# Patient Record
Sex: Female | Born: 1970 | Race: White | Hispanic: No | Marital: Single | Smoking: Current every day smoker
Health system: Southern US, Community
[De-identification: ages and names within clinical notes are randomized; demographics above are authoritative.]

## PROBLEM LIST (undated history)

## (undated) DIAGNOSIS — Z9114 Patient's other noncompliance with medication regimen: Secondary | ICD-10-CM

## (undated) DIAGNOSIS — E785 Hyperlipidemia, unspecified: Secondary | ICD-10-CM

## (undated) DIAGNOSIS — I2510 Atherosclerotic heart disease of native coronary artery without angina pectoris: Secondary | ICD-10-CM

## (undated) DIAGNOSIS — IMO0002 Reserved for concepts with insufficient information to code with codable children: Secondary | ICD-10-CM

## (undated) DIAGNOSIS — Z720 Tobacco use: Secondary | ICD-10-CM

## (undated) DIAGNOSIS — I213 ST elevation (STEMI) myocardial infarction of unspecified site: Secondary | ICD-10-CM

## (undated) DIAGNOSIS — F32A Depression, unspecified: Secondary | ICD-10-CM

## (undated) DIAGNOSIS — Z91148 Patient's other noncompliance with medication regimen for other reason: Secondary | ICD-10-CM

## (undated) DIAGNOSIS — M1990 Unspecified osteoarthritis, unspecified site: Secondary | ICD-10-CM

## (undated) DIAGNOSIS — E119 Type 2 diabetes mellitus without complications: Secondary | ICD-10-CM

## (undated) DIAGNOSIS — I10 Essential (primary) hypertension: Secondary | ICD-10-CM

## (undated) DIAGNOSIS — J849 Interstitial pulmonary disease, unspecified: Secondary | ICD-10-CM

## (undated) DIAGNOSIS — G4730 Sleep apnea, unspecified: Secondary | ICD-10-CM

## (undated) DIAGNOSIS — K589 Irritable bowel syndrome without diarrhea: Secondary | ICD-10-CM

## (undated) DIAGNOSIS — E1165 Type 2 diabetes mellitus with hyperglycemia: Secondary | ICD-10-CM

## (undated) DIAGNOSIS — E282 Polycystic ovarian syndrome: Secondary | ICD-10-CM

## (undated) DIAGNOSIS — M109 Gout, unspecified: Secondary | ICD-10-CM

## (undated) DIAGNOSIS — I639 Cerebral infarction, unspecified: Secondary | ICD-10-CM

## (undated) DIAGNOSIS — G629 Polyneuropathy, unspecified: Secondary | ICD-10-CM

## (undated) DIAGNOSIS — J984 Other disorders of lung: Secondary | ICD-10-CM

## (undated) DIAGNOSIS — F329 Major depressive disorder, single episode, unspecified: Secondary | ICD-10-CM

## (undated) DIAGNOSIS — F419 Anxiety disorder, unspecified: Secondary | ICD-10-CM

## (undated) HISTORY — DX: Polycystic ovarian syndrome: E28.2

## (undated) HISTORY — DX: Irritable bowel syndrome, unspecified: K58.9

## (undated) HISTORY — DX: Anxiety disorder, unspecified: F41.9

## (undated) HISTORY — DX: Unspecified osteoarthritis, unspecified site: M19.90

## (undated) HISTORY — DX: Gout, unspecified: M10.9

## (undated) HISTORY — DX: Sleep apnea, unspecified: G47.30

## (undated) HISTORY — DX: Hyperlipidemia, unspecified: E78.5

## (undated) HISTORY — PX: CORONARY ANGIOPLASTY: SHX604

## (undated) HISTORY — DX: Polyneuropathy, unspecified: G62.9

## (undated) HISTORY — DX: Other disorders of lung: J98.4

---
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
## 2014-03-02 ENCOUNTER — Emergency Department (HOSPITAL_COMMUNITY): Payer: Self-pay

## 2014-03-02 ENCOUNTER — Encounter (HOSPITAL_COMMUNITY): Payer: Self-pay | Admitting: Emergency Medicine

## 2014-03-02 ENCOUNTER — Emergency Department (HOSPITAL_COMMUNITY)
Admission: EM | Admit: 2014-03-02 | Discharge: 2014-03-03 | Disposition: A | Discharge status: Home / Self Care | Payer: Self-pay | Attending: Emergency Medicine | Admitting: Emergency Medicine

## 2014-03-02 DIAGNOSIS — J849 Interstitial pulmonary disease, unspecified: Secondary | ICD-10-CM

## 2014-03-02 DIAGNOSIS — Z720 Tobacco use: Secondary | ICD-10-CM | POA: Insufficient documentation

## 2014-03-02 DIAGNOSIS — Z79899 Other long term (current) drug therapy: Secondary | ICD-10-CM | POA: Insufficient documentation

## 2014-03-02 DIAGNOSIS — E1165 Type 2 diabetes mellitus with hyperglycemia: Secondary | ICD-10-CM

## 2014-03-02 DIAGNOSIS — I10 Essential (primary) hypertension: Secondary | ICD-10-CM | POA: Insufficient documentation

## 2014-03-02 HISTORY — DX: Essential (primary) hypertension: I10

## 2014-03-02 HISTORY — DX: Type 2 diabetes mellitus without complications: E11.9

## 2014-03-02 LAB — CBC WITH DIFFERENTIAL/PLATELET
BASOS ABS: 0 10*3/uL (ref 0.0–0.1)
Basophils Relative: 0 % (ref 0–1)
Eosinophils Absolute: 0.1 10*3/uL (ref 0.0–0.7)
Eosinophils Relative: 0 % (ref 0–5)
HEMATOCRIT: 40.6 % (ref 36.0–46.0)
Hemoglobin: 12.6 g/dL (ref 12.0–15.0)
LYMPHS ABS: 1.4 10*3/uL (ref 0.7–4.0)
LYMPHS PCT: 12 % (ref 12–46)
MCH: 25.9 pg — AB (ref 26.0–34.0)
MCHC: 31 g/dL (ref 30.0–36.0)
MCV: 83.4 fL (ref 78.0–100.0)
MONO ABS: 0.6 10*3/uL (ref 0.1–1.0)
Monocytes Relative: 5 % (ref 3–12)
Neutro Abs: 9.8 10*3/uL — ABNORMAL HIGH (ref 1.7–7.7)
Neutrophils Relative %: 83 % — ABNORMAL HIGH (ref 43–77)
Platelets: 255 10*3/uL (ref 150–400)
RBC: 4.87 MIL/uL (ref 3.87–5.11)
RDW: 14.8 % (ref 11.5–15.5)
WBC: 11.9 10*3/uL — ABNORMAL HIGH (ref 4.0–10.5)

## 2014-03-02 LAB — COMPREHENSIVE METABOLIC PANEL
ALT: 37 U/L — AB (ref 0–35)
ANION GAP: 8 (ref 5–15)
AST: 55 U/L — AB (ref 0–37)
Albumin: 3.2 g/dL — ABNORMAL LOW (ref 3.5–5.2)
Alkaline Phosphatase: 101 U/L (ref 39–117)
BUN: 8 mg/dL (ref 6–23)
CALCIUM: 8.4 mg/dL (ref 8.4–10.5)
CHLORIDE: 93 mmol/L — AB (ref 96–112)
CO2: 28 mmol/L (ref 19–32)
CREATININE: 0.79 mg/dL (ref 0.50–1.10)
Glucose, Bld: 290 mg/dL — ABNORMAL HIGH (ref 70–99)
Potassium: 4.2 mmol/L (ref 3.5–5.1)
SODIUM: 129 mmol/L — AB (ref 135–145)
TOTAL PROTEIN: 7.2 g/dL (ref 6.0–8.3)
Total Bilirubin: 0.5 mg/dL (ref 0.3–1.2)

## 2014-03-02 LAB — CBG MONITORING, ED: Glucose-Capillary: 279 mg/dL — ABNORMAL HIGH (ref 70–99)

## 2014-03-02 NOTE — ED Notes (Signed)
Pt c/o increased sob x 3 days.

## 2014-03-03 MED ORDER — IPRATROPIUM-ALBUTEROL 0.5-2.5 (3) MG/3ML IN SOLN
3.0000 mL | Freq: Once | RESPIRATORY_TRACT | Status: AC
  Administered 2014-03-03: 3 mL via RESPIRATORY_TRACT
  Filled 2014-03-03: qty 3

## 2014-03-03 MED ORDER — ALBUTEROL SULFATE (2.5 MG/3ML) 0.083% IN NEBU
2.5000 mg | INHALATION_SOLUTION | Freq: Once | RESPIRATORY_TRACT | Status: AC
  Administered 2014-03-03: 2.5 mg via RESPIRATORY_TRACT
  Filled 2014-03-03: qty 3

## 2014-03-03 MED ORDER — HYDROCOD POLST-CHLORPHEN POLST 10-8 MG/5ML PO LQCR
5.0000 mL | Freq: Once | ORAL | Status: AC
  Administered 2014-03-03: 5 mL via ORAL
  Filled 2014-03-03: qty 5

## 2014-03-03 MED ORDER — HYDROCODONE-ACETAMINOPHEN 5-325 MG PO TABS: 1.0000 | ORAL_TABLET | ORAL | Status: DC | PRN

## 2014-03-03 MED ORDER — METFORMIN HCL 500 MG PO TABS
1000.0000 mg | ORAL_TABLET | Freq: Once | ORAL | Status: AC
  Administered 2014-03-03: 1000 mg via ORAL
  Filled 2014-03-03: qty 2

## 2014-03-03 MED ORDER — METHYLPREDNISOLONE SODIUM SUCC 125 MG IJ SOLR
125.0000 mg | Freq: Once | INTRAMUSCULAR | Status: AC
  Administered 2014-03-03: 125 mg via INTRAVENOUS
  Filled 2014-03-03: qty 2

## 2014-03-03 MED ORDER — METFORMIN HCL 1000 MG PO TABS: 1000.0000 mg | ORAL_TABLET | Freq: Two times a day (BID) | ORAL | Status: DC

## 2014-03-03 MED ORDER — PREDNISONE 10 MG PO TABS: ORAL_TABLET | ORAL | Status: DC

## 2014-03-03 NOTE — ED Provider Notes (Signed)
CSN: [id number]     Arrival date & time 03/02/14  2224 History   First MD Initiated Contact with Patient 03/02/14 2314     Chief Complaint  Patient presents with  . Shortness of Breath     (Consider location/radiation/quality/duration/timing/severity/associated sxs/prior Treatment) The history is provided by the patient.   Joanna [name] is a 44 y.o. female with past medical history of interstitial lung disease, DM and HTN presenting with a 3-4 day history of increased shortness of breath along with cough productive of white sputum which has not responded to her albuterol mdi or neb treatments, stating she has had 2 treatments today without relief.  She denies fevers, chills, chest pain except states she has ribcage soreness induced by frequency of cough. She also endorses headache when coughing.  Her pcp is in [place], [place].  She recently moved here to stay with family and has not transferred her care to a new provider yet.  She endorses she is currently not taking her metformin although does have refills she cannot obtain in [place] due to lack of transportation.  She does not check her cbg's.  Denies nausea, vomiting, abdominal pain, denies increased thirst, increased urinary frequency.     Past Medical History  Diagnosis Date  . Lung disease   . Diabetes mellitus without complication   . Hypertension    History reviewed. No pertinent past surgical history. History reviewed. No pertinent family history. History  Substance Use Topics  . Smoking status: Current Every Day Smoker    Types: Cigarettes  . Smokeless tobacco: Not on file  . Alcohol Use: No   OB History    No data available     Review of Systems  Constitutional: Negative for fever.  HENT: Negative for congestion and sore throat.   Eyes: Negative.   Respiratory: Positive for cough, chest tightness and shortness of breath. Negative for wheezing.   Cardiovascular: Negative for chest pain, palpitations and leg swelling.   Gastrointestinal: Negative for nausea and abdominal pain.  Genitourinary: Negative.   Musculoskeletal: Negative for joint swelling, arthralgias and neck pain.  Skin: Negative.  Negative for rash and wound.  Neurological: Negative for dizziness, weakness, light-headedness, numbness and headaches.  Psychiatric/Behavioral: Negative.       Allergies  Wellbutrin  Home Medications   Prior to Admission medications   Medication Sig Start Date End Date Taking? Authorizing Provider  albuterol (PROVENTIL HFA;VENTOLIN HFA) 108 (90 BASE) MCG/ACT inhaler Inhale into the lungs every 6 (six) hours as needed for wheezing or shortness of breath.   Yes Historical Provider, MD  Calcium Carbonate-Vitamin D (CALCIUM-VITAMIN D) 500-200 MG-UNIT per tablet Take 1 tablet by mouth 2 (two) times daily.   Yes Historical Provider, MD  citalopram (CELEXA) 20 MG tablet Take 20 mg by mouth daily.   Yes Historical Provider, MD  clonazePAM (KLONOPIN) 0.5 MG tablet Take 0.5 mg by mouth 2 (two) times daily as needed for anxiety.   Yes Historical Provider, MD  lisinopril (PRINIVIL,ZESTRIL) 20 MG tablet Take 20 mg by mouth daily.   Yes Historical Provider, MD  metFORMIN (GLUCOPHAGE) 1000 MG tablet Take 1,000 mg by mouth 2 (two) times daily with a meal.   Yes Historical Provider, MD  omeprazole (PRILOSEC) 40 MG capsule Take 40 mg by mouth daily.   Yes Historical Provider, MD  pravastatin (PRAVACHOL) 20 MG tablet Take 20 mg by mouth daily.   Yes Historical Provider, MD   BP 139/69 mmHg  Pulse 97  Temp(Src) 98.2  F (36.8 C) (Oral)  Resp 24  Ht 5\' 5"  (1.651 m)  Wt 352 lb (159.666 kg)  BMI 58.58 kg/m2  SpO2 99%  LMP 03/02/2012 (Approximate) Physical Exam  Constitutional: She appears well-developed and well-nourished.  HENT:  Head: Normocephalic and atraumatic.  Eyes: Conjunctivae are normal.  Neck: Normal range of motion.  Cardiovascular: Normal rate, regular rhythm, normal heart sounds and intact distal pulses.    Pulmonary/Chest: Effort normal. She has decreased breath sounds. She has no wheezes. She has no rhonchi.  Distant breath sounds throughout all lung fields.  Abdominal: Soft. Bowel sounds are normal. There is no tenderness.  Musculoskeletal: Normal range of motion.  Neurological: She is alert.  Skin: Skin is warm and dry.  Psychiatric: She has a normal mood and affect.  Nursing note and vitals reviewed.   ED Course  Procedures (including critical care time) Labs Review Labs Reviewed  CBC WITH DIFFERENTIAL/PLATELET - Abnormal; Notable for the following:    WBC 11.9 (*)    MCH 25.9 (*)    Neutrophils Relative % 83 (*)    Neutro Abs 9.8 (*)    All other components within normal limits  COMPREHENSIVE METABOLIC PANEL - Abnormal; Notable for the following:    Sodium 129 (*)    Chloride 93 (*)    Glucose, Bld 290 (*)    Albumin 3.2 (*)    AST 55 (*)    ALT 37 (*)    All other components within normal limits  CBG MONITORING, ED - Abnormal; Notable for the following:    Glucose-Capillary 279 (*)    All other components within normal limits    Imaging Review Dg Chest 2 View  03/02/2014   CLINICAL DATA:  Increased shortness of breath and difficulty breathing for 3 days. History of interstitial lung disease, diabetes, hypertension, and smoker.  EXAM: CHEST  2 VIEW  COMPARISON:  None.  FINDINGS: Normal heart size and pulmonary vascularity. Fine diffuse interstitial reticular nodular pattern to the lungs consistent with history of interstitial lung disease. No focal airspace consolidation in the lungs. No blunting of costophrenic angles. No pneumothorax. Mediastinal contours appear intact.  IMPRESSION: Diffuse reticular nodular interstitial pattern throughout the lungs. No focal consolidation.   Electronically Signed   By: Lucienne Capers M.D.   On: 03/02/2014 23:36     EKG Interpretation None      MDM   Final diagnoses:  None    Medications  ipratropium-albuterol (DUONEB)  0.5-2.5 (3) MG/3ML nebulizer solution 3 mL (3 mLs Nebulization Given 03/03/14 0043)  albuterol (PROVENTIL) (2.5 MG/3ML) 0.083% nebulizer solution 2.5 mg (2.5 mg Nebulization Given 03/03/14 0043)  chlorpheniramine-HYDROcodone (TUSSIONEX) 10-8 MG/5ML suspension 5 mL (5 mLs Oral Given 03/03/14 0012)  methylPREDNISolone sodium succinate (SOLU-MEDROL) 125 mg/2 mL injection 125 mg (125 mg Intravenous Given 03/03/14 0012)  metFORMIN (GLUCOPHAGE) tablet 1,000 mg (1,000 mg Oral Given 03/03/14 0017)     Pt was given albuterol/atrovent neb, solumedrol 125 mg IV, tussionex for cough and felt greatly improved with her breathing and decreased coughing.  Currently still on 2L oxygen which she presented via EMS.  Recheck sx and VS including pulse ox ambulating off O2, pt stable, pulse ox 95%.  Vicodin for cough - although tussionex worked well, pt will be unable to afford.  She was given short course of reduced steroid taper. Referrals given for establishing local care.  Pt currently noncompliant with her metformin.  This was refilled for her.  Labs reviewed, hyperglycemia, no gap.  Corrected Na+ -132.    Evalee Jefferson, PA-C 03/03/14 1828  Janice Norrie, MD 03/03/14 [phone]

## 2014-03-03 NOTE — ED Notes (Signed)
Pt ambulated around nursing station and oxygen level stayed at 95%, she states she can breathe better now and is ready to go home, EDP notified.

## 2014-03-03 NOTE — Discharge Instructions (Signed)
Chronic Obstructive Pulmonary Disease Chronic obstructive pulmonary disease (COPD) is a common lung problem. In COPD, the flow of air from the lungs is limited. The way your lungs work will probably never return to normal, but there are things you can do to improve your lungs and make yourself feel better. HOME CARE  Take all medicines as told by your doctor.  Avoid medicines or cough syrups that dry up your airway (such as antihistamines) and do not allow you to get rid of thick spit. You do not need to avoid them if told differently by your doctor.  If you smoke, stop. Smoking makes the problem worse.  Avoid being around things that make your breathing worse (like smoke, chemicals, and fumes).  Use oxygen therapy and therapy to help improve your lungs (pulmonary rehabilitation) if told by your doctor. If you need home oxygen therapy, ask your doctor if you should buy a tool to measure your oxygen level (oximeter).  Avoid people who have a sickness you can catch (contagious).  Avoid going outside when it is very hot, cold, or humid.  Eat healthy foods. Eat smaller meals more often. Rest before meals.  Stay active, but remember to also rest.  Make sure to get all the shots (vaccines) your doctor recommends. Ask your doctor if you need a pneumonia shot.  Learn and use tips on how to relax.  Learn and use tips on how to control your breathing as told by your doctor. Try:  Breathing in (inhaling) through your nose for 1 second. Then, pucker your lips and breath out (exhale) through your lips for 2 seconds.  Putting one hand on your belly (abdomen). Breathe in slowly through your nose for 1 second. Your hand on your belly should move out. Pucker your lips and breathe out slowly through your lips. Your hand on your belly should move in as you breathe out.  Learn and use controlled coughing to clear thick spit from your lungs. The steps are:  Lean your head a little forward.  Breathe in  deeply.  Try to hold your breath for 3 seconds.  Keep your mouth slightly open while coughing 2 times.  Spit any thick spit out into a tissue.  Rest and do the steps again 1 or 2 times as needed. GET HELP IF:  You cough up more thick spit than usual.  There is a change in the color or thickness of the spit.  It is harder to breathe than usual.  Your breathing is faster than usual. GET HELP RIGHT AWAY IF:   You have shortness of breath while resting.  You have shortness of breath that stops you from:  Being able to talk.  Doing normal activities.  You chest hurts for longer than 5 minutes.  Your skin color is more blue than usual.  Your pulse oximeter shows that you have low oxygen for longer than 5 minutes. MAKE SURE YOU:   Understand these instructions.  Will watch your condition.  Will get help right away if you are not doing well or get worse. Document Released: 07/12/2007 Document Revised: 06/09/2013 Document Reviewed: 09/19/2012 [facility] Patient Information 2015 [place], [place]. This information is not intended to replace advice given to you by your health care provider. Make sure you discuss any questions you have with your health care provider.  Hyperglycemia Hyperglycemia occurs when the glucose (sugar) in your blood is too high. Hyperglycemia can happen for many reasons, but it most often happens to people  who do not know they have diabetes or are not managing their diabetes properly.  CAUSES  Whether you have diabetes or not, there are other causes of hyperglycemia. Hyperglycemia can occur when you have diabetes, but it can also occur in other situations that you might not be as aware of, such as: Diabetes  If you have diabetes and are having problems controlling your blood glucose, hyperglycemia could occur because of some of the following reasons:  Not following your meal plan.  Not taking your diabetes medications or not taking it  properly.  Exercising less or doing less activity than you normally do.  Being sick. Pre-diabetes  This cannot be ignored. Before people develop Type 2 diabetes, they almost always have "pre-diabetes." This is when your blood glucose levels are higher than normal, but not yet high enough to be diagnosed as diabetes. Research has shown that some long-term damage to the body, especially the heart and circulatory system, may already be occurring during pre-diabetes. If you take action to manage your blood glucose when you have pre-diabetes, you may delay or prevent Type 2 diabetes from developing. Stress  If you have diabetes, you may be "diet" controlled or on oral medications or insulin to control your diabetes. However, you may find that your blood glucose is higher than usual in the hospital whether you have diabetes or not. This is often referred to as "stress hyperglycemia." Stress can elevate your blood glucose. This happens because of hormones put out by the body during times of stress. If stress has been the cause of your high blood glucose, it can be followed regularly by your caregiver. That way he/she can make sure your hyperglycemia does not continue to get worse or progress to diabetes. Steroids  Steroids are medications that act on the infection fighting system (immune system) to block inflammation or infection. One side effect can be a rise in blood glucose. Most people can produce enough extra insulin to allow for this rise, but for those who cannot, steroids make blood glucose levels go even higher. It is not unusual for steroid treatments to "uncover" diabetes that is developing. It is not always possible to determine if the hyperglycemia will go away after the steroids are stopped. A special blood test called an A1c is sometimes done to determine if your blood glucose was elevated before the steroids were started. SYMPTOMS  Thirsty.  Frequent urination.  Dry mouth.  Blurred  vision.  Tired or fatigue.  Weakness.  Sleepy.  Tingling in feet or leg. DIAGNOSIS  Diagnosis is made by monitoring blood glucose in one or all of the following ways:  A1c test. This is a chemical found in your blood.  Fingerstick blood glucose monitoring.  Laboratory results. TREATMENT  First, knowing the cause of the hyperglycemia is important before the hyperglycemia can be treated. Treatment may include, but is not be limited to:  Education.  Change or adjustment in medications.  Change or adjustment in meal plan.  Treatment for an illness, infection, etc.  More frequent blood glucose monitoring.  Change in exercise plan.  Decreasing or stopping steroids.  Lifestyle changes. HOME CARE INSTRUCTIONS   Test your blood glucose as directed.  Exercise regularly. Your caregiver will give you instructions about exercise. Pre-diabetes or diabetes which comes on with stress is helped by exercising.  Eat wholesome, balanced meals. Eat often and at regular, fixed times. Your caregiver or nutritionist will give you a meal plan to guide your sugar intake.  Being at an ideal weight is important. If needed, losing as little as 10 to 15 pounds may help improve blood glucose levels. SEEK MEDICAL CARE IF:   You have questions about medicine, activity, or diet.  You continue to have symptoms (problems such as increased thirst, urination, or weight gain). SEEK IMMEDIATE MEDICAL CARE IF:   You are vomiting or have diarrhea.  Your breath smells fruity.  You are breathing faster or slower.  You are very sleepy or incoherent.  You have numbness, tingling, or pain in your feet or hands.  You have chest pain.  Your symptoms get worse even though you have been following your caregiver's orders.  If you have any other questions or concerns. Document Released: 07/19/2000 Document Revised: 04/17/2011 Document Reviewed: 05/22/2011 [facility] Patient Information 2015 [place],  [place]. This information is not intended to replace advice given to you by your health care provider. Make sure you discuss any questions you have with your health care provider.  Take your next dose of prednisone tonight (Tuesday night).  Continue using your home nebulizer and/or albuterol inhaler medicines every 4 hours.  Get back on your metformin to get your blood sugar under better control.  This medicine should be available for $4 at [facility].

## 2014-07-23 ENCOUNTER — Encounter (HOSPITAL_COMMUNITY)
Admission: EM | Disposition: A | Discharge status: Home / Self Care | Payer: Self-pay | Source: Home / Self Care | Attending: Cardiology

## 2014-07-23 ENCOUNTER — Encounter (HOSPITAL_COMMUNITY): Payer: Self-pay | Admitting: Emergency Medicine

## 2014-07-23 ENCOUNTER — Emergency Department (HOSPITAL_COMMUNITY): Payer: Medicaid Other

## 2014-07-23 ENCOUNTER — Inpatient Hospital Stay (HOSPITAL_COMMUNITY)
Admission: EM | Admit: 2014-07-23 | Discharge: 2014-07-28 | DRG: 251 | Disposition: A | Discharge status: Home / Self Care | Payer: Medicaid Other | Attending: Cardiology | Admitting: Cardiology

## 2014-07-23 DIAGNOSIS — I213 ST elevation (STEMI) myocardial infarction of unspecified site: Secondary | ICD-10-CM | POA: Diagnosis present

## 2014-07-23 DIAGNOSIS — F32A Depression, unspecified: Secondary | ICD-10-CM | POA: Diagnosis present

## 2014-07-23 DIAGNOSIS — Z9114 Patient's other noncompliance with medication regimen: Secondary | ICD-10-CM | POA: Diagnosis not present

## 2014-07-23 DIAGNOSIS — I2119 ST elevation (STEMI) myocardial infarction involving other coronary artery of inferior wall: Principal | ICD-10-CM | POA: Diagnosis present

## 2014-07-23 DIAGNOSIS — I2510 Atherosclerotic heart disease of native coronary artery without angina pectoris: Secondary | ICD-10-CM

## 2014-07-23 DIAGNOSIS — I25119 Atherosclerotic heart disease of native coronary artery with unspecified angina pectoris: Secondary | ICD-10-CM | POA: Diagnosis present

## 2014-07-23 DIAGNOSIS — E780 Pure hypercholesterolemia: Secondary | ICD-10-CM | POA: Diagnosis present

## 2014-07-23 DIAGNOSIS — E1165 Type 2 diabetes mellitus with hyperglycemia: Secondary | ICD-10-CM | POA: Diagnosis present

## 2014-07-23 DIAGNOSIS — Z91148 Patient's other noncompliance with medication regimen for other reason: Secondary | ICD-10-CM

## 2014-07-23 DIAGNOSIS — Z8249 Family history of ischemic heart disease and other diseases of the circulatory system: Secondary | ICD-10-CM | POA: Diagnosis not present

## 2014-07-23 DIAGNOSIS — F17210 Nicotine dependence, cigarettes, uncomplicated: Secondary | ICD-10-CM | POA: Diagnosis not present

## 2014-07-23 DIAGNOSIS — F329 Major depressive disorder, single episode, unspecified: Secondary | ICD-10-CM | POA: Diagnosis not present

## 2014-07-23 DIAGNOSIS — E785 Hyperlipidemia, unspecified: Secondary | ICD-10-CM | POA: Diagnosis present

## 2014-07-23 DIAGNOSIS — I10 Essential (primary) hypertension: Secondary | ICD-10-CM | POA: Diagnosis present

## 2014-07-23 DIAGNOSIS — J849 Interstitial pulmonary disease, unspecified: Secondary | ICD-10-CM | POA: Diagnosis present

## 2014-07-23 DIAGNOSIS — I2111 ST elevation (STEMI) myocardial infarction involving right coronary artery: Secondary | ICD-10-CM | POA: Diagnosis present

## 2014-07-23 DIAGNOSIS — D72829 Elevated white blood cell count, unspecified: Secondary | ICD-10-CM

## 2014-07-23 DIAGNOSIS — Z6841 Body Mass Index (BMI) 40.0 and over, adult: Secondary | ICD-10-CM

## 2014-07-23 DIAGNOSIS — E119 Type 2 diabetes mellitus without complications: Secondary | ICD-10-CM

## 2014-07-23 DIAGNOSIS — R079 Chest pain, unspecified: Secondary | ICD-10-CM | POA: Diagnosis present

## 2014-07-23 DIAGNOSIS — L680 Hirsutism: Secondary | ICD-10-CM | POA: Diagnosis not present

## 2014-07-23 DIAGNOSIS — F17200 Nicotine dependence, unspecified, uncomplicated: Secondary | ICD-10-CM | POA: Diagnosis present

## 2014-07-23 DIAGNOSIS — IMO0002 Reserved for concepts with insufficient information to code with codable children: Secondary | ICD-10-CM | POA: Insufficient documentation

## 2014-07-23 HISTORY — DX: ST elevation (STEMI) myocardial infarction of unspecified site: I21.3

## 2014-07-23 HISTORY — DX: Tobacco use: Z72.0

## 2014-07-23 HISTORY — DX: Patient's other noncompliance with medication regimen for other reason: Z91.148

## 2014-07-23 HISTORY — DX: Depression, unspecified: F32.A

## 2014-07-23 HISTORY — DX: Type 2 diabetes mellitus with hyperglycemia: E11.65

## 2014-07-23 HISTORY — DX: Morbid (severe) obesity due to excess calories: E66.01

## 2014-07-23 HISTORY — DX: Major depressive disorder, single episode, unspecified: F32.9

## 2014-07-23 HISTORY — PX: CARDIAC CATHETERIZATION: SHX172

## 2014-07-23 HISTORY — DX: Reserved for concepts with insufficient information to code with codable children: IMO0002

## 2014-07-23 HISTORY — DX: Interstitial pulmonary disease, unspecified: J84.9

## 2014-07-23 HISTORY — DX: Patient's other noncompliance with medication regimen: Z91.14

## 2014-07-23 HISTORY — DX: Atherosclerotic heart disease of native coronary artery without angina pectoris: I25.10

## 2014-07-23 LAB — PROTIME-INR
INR: 1.11 (ref 0.00–1.49)
PROTHROMBIN TIME: 14.5 s (ref 11.6–15.2)

## 2014-07-23 LAB — CBC
HCT: 43.1 % (ref 36.0–46.0)
Hemoglobin: 14.3 g/dL (ref 12.0–15.0)
MCH: 26.3 pg (ref 26.0–34.0)
MCHC: 33.2 g/dL (ref 30.0–36.0)
MCV: 79.4 fL (ref 78.0–100.0)
Platelets: 301 10*3/uL (ref 150–400)
RBC: 5.43 MIL/uL — ABNORMAL HIGH (ref 3.87–5.11)
RDW: 15.7 % — AB (ref 11.5–15.5)
WBC: 18.4 10*3/uL — ABNORMAL HIGH (ref 4.0–10.5)

## 2014-07-23 LAB — COMPREHENSIVE METABOLIC PANEL
ALT: 14 U/L (ref 14–54)
ANION GAP: 11 (ref 5–15)
AST: 29 U/L (ref 15–41)
Albumin: 3.3 g/dL — ABNORMAL LOW (ref 3.5–5.0)
Alkaline Phosphatase: 90 U/L (ref 38–126)
BILIRUBIN TOTAL: 0.4 mg/dL (ref 0.3–1.2)
BUN: 12 mg/dL (ref 6–20)
CO2: 26 mmol/L (ref 22–32)
CREATININE: 0.73 mg/dL (ref 0.44–1.00)
Calcium: 8.6 mg/dL — ABNORMAL LOW (ref 8.9–10.3)
Chloride: 96 mmol/L — ABNORMAL LOW (ref 101–111)
Glucose, Bld: 331 mg/dL — ABNORMAL HIGH (ref 65–99)
Potassium: 3.5 mmol/L (ref 3.5–5.1)
Sodium: 133 mmol/L — ABNORMAL LOW (ref 135–145)
Total Protein: 7.8 g/dL (ref 6.5–8.1)

## 2014-07-23 LAB — MRSA PCR SCREENING: MRSA BY PCR: POSITIVE — AB

## 2014-07-23 LAB — CBG MONITORING, ED: Glucose-Capillary: 346 mg/dL — ABNORMAL HIGH (ref 65–99)

## 2014-07-23 LAB — GLUCOSE, CAPILLARY
GLUCOSE-CAPILLARY: 225 mg/dL — AB (ref 65–99)
Glucose-Capillary: 293 mg/dL — ABNORMAL HIGH (ref 65–99)
Glucose-Capillary: 282 mg/dL — ABNORMAL HIGH (ref 65–99)

## 2014-07-23 LAB — TROPONIN I
TROPONIN I: 2.78 ng/mL — AB (ref <0.031)
Troponin I: 3.32 ng/mL (ref <0.031)

## 2014-07-23 LAB — TSH: TSH: 3.54 u[IU]/mL (ref 0.350–4.500)

## 2014-07-23 LAB — APTT: aPTT: 26 seconds (ref 24–37)

## 2014-07-23 SURGERY — LEFT HEART CATH AND CORONARY ANGIOGRAPHY
Anesthesia: LOCAL

## 2014-07-23 MED ORDER — PNEUMOCOCCAL VAC POLYVALENT 25 MCG/0.5ML IJ INJ
0.5000 mL | INJECTION | INTRAMUSCULAR | Status: AC
  Administered 2014-07-24: 0.5 mL via INTRAMUSCULAR
  Filled 2014-07-23: qty 0.5

## 2014-07-23 MED ORDER — SODIUM CHLORIDE 0.9 % IV SOLN: 250.0000 mL | INTRAVENOUS | Status: DC | PRN

## 2014-07-23 MED ORDER — CITALOPRAM HYDROBROMIDE 20 MG PO TABS
20.0000 mg | ORAL_TABLET | Freq: Every day | ORAL | Status: DC
  Administered 2014-07-24 – 2014-07-28 (×5): 20 mg via ORAL
  Filled 2014-07-23 (×5): qty 1

## 2014-07-23 MED ORDER — HEPARIN SODIUM (PORCINE) 1000 UNIT/ML IJ SOLN
INTRAMUSCULAR | Status: AC
  Filled 2014-07-23: qty 1

## 2014-07-23 MED ORDER — SODIUM CHLORIDE 0.9 % IV SOLN
INTRAVENOUS | Status: DC
  Administered 2014-07-23: 10 mL/h via INTRAVENOUS

## 2014-07-23 MED ORDER — TICAGRELOR 90 MG PO TABS
ORAL_TABLET | ORAL | Status: AC
  Filled 2014-07-23: qty 1

## 2014-07-23 MED ORDER — METOPROLOL TARTRATE 25 MG PO TABS
25.0000 mg | ORAL_TABLET | Freq: Two times a day (BID) | ORAL | Status: DC
  Administered 2014-07-23: 25 mg via ORAL
  Filled 2014-07-23 (×3): qty 1

## 2014-07-23 MED ORDER — LIDOCAINE HCL (PF) 1 % IJ SOLN
INTRAMUSCULAR | Status: AC
  Filled 2014-07-23: qty 30

## 2014-07-23 MED ORDER — ONDANSETRON HCL 4 MG/2ML IJ SOLN: 4.0000 mg | Freq: Four times a day (QID) | INTRAMUSCULAR | Status: DC | PRN

## 2014-07-23 MED ORDER — FENTANYL CITRATE (PF) 100 MCG/2ML IJ SOLN
INTRAMUSCULAR | Status: DC | PRN
  Administered 2014-07-23: 25 ug via INTRAVENOUS

## 2014-07-23 MED ORDER — LISINOPRIL 20 MG PO TABS
20.0000 mg | ORAL_TABLET | Freq: Every day | ORAL | Status: DC
  Administered 2014-07-24 – 2014-07-28 (×5): 20 mg via ORAL
  Filled 2014-07-23 (×5): qty 1

## 2014-07-23 MED ORDER — TICAGRELOR 90 MG PO TABS
90.0000 mg | ORAL_TABLET | Freq: Two times a day (BID) | ORAL | Status: DC
  Administered 2014-07-23 – 2014-07-27 (×8): 90 mg via ORAL
  Filled 2014-07-23 (×9): qty 1

## 2014-07-23 MED ORDER — MIDAZOLAM HCL 2 MG/2ML IJ SOLN
INTRAMUSCULAR | Status: DC | PRN
  Administered 2014-07-23: 1 mg via INTRAVENOUS

## 2014-07-23 MED ORDER — SODIUM CHLORIDE 0.9 % IJ SOLN
3.0000 mL | Freq: Two times a day (BID) | INTRAMUSCULAR | Status: DC
Start: 2014-07-23 — End: 2014-07-28
  Administered 2014-07-23 – 2014-07-28 (×9): 3 mL via INTRAVENOUS

## 2014-07-23 MED ORDER — NITROGLYCERIN IN D5W 200-5 MCG/ML-% IV SOLN: 5.0000 ug/min | Freq: Once | INTRAVENOUS | Status: DC

## 2014-07-23 MED ORDER — PANTOPRAZOLE SODIUM 40 MG PO TBEC
40.0000 mg | DELAYED_RELEASE_TABLET | Freq: Every day | ORAL | Status: DC
  Administered 2014-07-24 – 2014-07-28 (×5): 40 mg via ORAL
  Filled 2014-07-23 (×5): qty 1

## 2014-07-23 MED ORDER — HEPARIN SODIUM (PORCINE) 5000 UNIT/ML IJ SOLN
4000.0000 [IU] | INTRAMUSCULAR | Status: AC
  Administered 2014-07-23: 4000 [IU] via INTRAVENOUS
  Filled 2014-07-23: qty 1

## 2014-07-23 MED ORDER — SODIUM CHLORIDE 0.9 % IJ SOLN: 3.0000 mL | INTRAMUSCULAR | Status: DC | PRN

## 2014-07-23 MED ORDER — POTASSIUM CHLORIDE CRYS ER 20 MEQ PO TBCR: 40.0000 meq | EXTENDED_RELEASE_TABLET | Freq: Once | ORAL | Status: DC

## 2014-07-23 MED ORDER — VERAPAMIL HCL 2.5 MG/ML IV SOLN
INTRAVENOUS | Status: AC
  Filled 2014-07-23: qty 2

## 2014-07-23 MED ORDER — FENTANYL CITRATE (PF) 100 MCG/2ML IJ SOLN
INTRAMUSCULAR | Status: AC
  Filled 2014-07-23: qty 2

## 2014-07-23 MED ORDER — SODIUM CHLORIDE 0.9 % WEIGHT BASED INFUSION
3.0000 mL/kg/h | INTRAVENOUS | Status: AC
Start: 2014-07-23 — End: 2014-07-23
  Administered 2014-07-23: 3 mL/kg/h via INTRAVENOUS

## 2014-07-23 MED ORDER — NITROGLYCERIN IN D5W 200-5 MCG/ML-% IV SOLN
INTRAVENOUS | Status: AC
  Administered 2014-07-23: 10 ug/min via INTRAVENOUS
  Filled 2014-07-23: qty 250

## 2014-07-23 MED ORDER — TIROFIBAN HCL IV 12.5 MG/250 ML
INTRAVENOUS | Status: AC
  Filled 2014-07-23: qty 250

## 2014-07-23 MED ORDER — ASPIRIN 81 MG PO CHEW
324.0000 mg | CHEWABLE_TABLET | Freq: Once | ORAL | Status: AC
  Administered 2014-07-23: 324 mg via ORAL
  Filled 2014-07-23: qty 4

## 2014-07-23 MED ORDER — METOPROLOL TARTRATE 12.5 MG HALF TABLET
12.5000 mg | ORAL_TABLET | Freq: Two times a day (BID) | ORAL | Status: DC
  Filled 2014-07-23: qty 1

## 2014-07-23 MED ORDER — ATORVASTATIN CALCIUM 80 MG PO TABS
80.0000 mg | ORAL_TABLET | Freq: Every day | ORAL | Status: DC
  Administered 2014-07-23 – 2014-07-27 (×5): 80 mg via ORAL
  Filled 2014-07-23 (×6): qty 1

## 2014-07-23 MED ORDER — HEPARIN (PORCINE) IN NACL 100-0.45 UNIT/ML-% IJ SOLN
12.0000 [IU]/kg/h | Freq: Once | INTRAMUSCULAR | Status: AC
  Administered 2014-07-23: 12 [IU]/kg/h via INTRAVENOUS
  Filled 2014-07-23: qty 250

## 2014-07-23 MED ORDER — HEPARIN (PORCINE) IN NACL 2-0.9 UNIT/ML-% IJ SOLN
INTRAMUSCULAR | Status: AC
  Filled 2014-07-23: qty 1000

## 2014-07-23 MED ORDER — LIDOCAINE HCL (PF) 1 % IJ SOLN
INTRAMUSCULAR | Status: DC | PRN
  Administered 2014-07-23: 5 mL via INTRADERMAL

## 2014-07-23 MED ORDER — NITROGLYCERIN 0.4 MG SL SUBL: 0.4000 mg | SUBLINGUAL_TABLET | SUBLINGUAL | Status: DC | PRN

## 2014-07-23 MED ORDER — TICAGRELOR 90 MG PO TABS
ORAL_TABLET | ORAL | Status: DC | PRN
  Administered 2014-07-23: 180 mg via ORAL

## 2014-07-23 MED ORDER — INSULIN ASPART 100 UNIT/ML ~~LOC~~ SOLN
0.0000 [IU] | Freq: Every day | SUBCUTANEOUS | Status: DC
  Administered 2014-07-23 – 2014-07-25 (×3): 2 [IU] via SUBCUTANEOUS

## 2014-07-23 MED ORDER — INSULIN ASPART 100 UNIT/ML ~~LOC~~ SOLN
0.0000 [IU] | Freq: Three times a day (TID) | SUBCUTANEOUS | Status: DC
  Administered 2014-07-23 – 2014-07-24 (×3): 8 [IU] via SUBCUTANEOUS
  Administered 2014-07-24 – 2014-07-25 (×2): 5 [IU] via SUBCUTANEOUS
  Administered 2014-07-25 – 2014-07-26 (×5): 3 [IU] via SUBCUTANEOUS
  Administered 2014-07-27: 2 [IU] via SUBCUTANEOUS
  Administered 2014-07-27: 5 [IU] via SUBCUTANEOUS
  Administered 2014-07-28: 2 [IU] via SUBCUTANEOUS
  Administered 2014-07-28: 3 [IU] via SUBCUTANEOUS

## 2014-07-23 MED ORDER — ASPIRIN EC 81 MG PO TBEC
81.0000 mg | DELAYED_RELEASE_TABLET | Freq: Every day | ORAL | Status: DC
  Administered 2014-07-24 – 2014-07-27 (×4): 81 mg via ORAL
  Filled 2014-07-23 (×4): qty 1

## 2014-07-23 MED ORDER — MUPIROCIN 2 % EX OINT
1.0000 "application " | TOPICAL_OINTMENT | Freq: Two times a day (BID) | CUTANEOUS | Status: AC
  Administered 2014-07-23 – 2014-07-28 (×10): 1 via NASAL
  Filled 2014-07-23: qty 22

## 2014-07-23 MED ORDER — ZOLPIDEM TARTRATE 5 MG PO TABS: 5.0000 mg | ORAL_TABLET | Freq: Every evening | ORAL | Status: DC | PRN

## 2014-07-23 MED ORDER — MIDAZOLAM HCL 2 MG/2ML IJ SOLN
INTRAMUSCULAR | Status: AC
  Filled 2014-07-23: qty 2

## 2014-07-23 MED ORDER — TIROFIBAN HCL IV 12.5 MG/250 ML
INTRAVENOUS | Status: DC | PRN
  Administered 2014-07-23: 25 ug/h via INTRAVENOUS

## 2014-07-23 MED ORDER — ACETAMINOPHEN 325 MG PO TABS: 650.0000 mg | ORAL_TABLET | ORAL | Status: DC | PRN

## 2014-07-23 MED ORDER — HEPARIN (PORCINE) IN NACL 2-0.9 UNIT/ML-% IJ SOLN
INTRAMUSCULAR | Status: AC
  Filled 2014-07-23: qty 500

## 2014-07-23 MED ORDER — CHLORHEXIDINE GLUCONATE CLOTH 2 % EX PADS
6.0000 | MEDICATED_PAD | Freq: Every day | CUTANEOUS | Status: AC
  Administered 2014-07-24 – 2014-07-28 (×5): 6 via TOPICAL

## 2014-07-23 MED ORDER — NITROGLYCERIN IN D5W 200-5 MCG/ML-% IV SOLN
5.0000 ug/min | Freq: Once | INTRAVENOUS | Status: AC
  Administered 2014-07-23: 10 ug/min via INTRAVENOUS

## 2014-07-23 MED ORDER — ALBUTEROL SULFATE HFA 108 (90 BASE) MCG/ACT IN AERS: 1.0000 | INHALATION_SPRAY | Freq: Four times a day (QID) | RESPIRATORY_TRACT | Status: DC | PRN

## 2014-07-23 MED ORDER — TIROFIBAN HCL IV 12.5 MG/250 ML
0.1500 ug/kg/min | INTRAVENOUS | Status: DC
  Administered 2014-07-23 – 2014-07-27 (×10): 0.15 ug/kg/min via INTRAVENOUS
  Filled 2014-07-23: qty 100
  Filled 2014-07-23 (×3): qty 250
  Filled 2014-07-23 (×2): qty 100
  Filled 2014-07-23 (×7): qty 250
  Filled 2014-07-23: qty 100
  Filled 2014-07-23: qty 250
  Filled 2014-07-23: qty 100
  Filled 2014-07-23 (×2): qty 250
  Filled 2014-07-23: qty 100
  Filled 2014-07-23 (×5): qty 250

## 2014-07-23 MED ORDER — SODIUM CHLORIDE 0.9 % IJ SOLN
3.0000 mL | Freq: Two times a day (BID) | INTRAMUSCULAR | Status: DC
  Administered 2014-07-23 – 2014-07-25 (×4): 3 mL via INTRAVENOUS

## 2014-07-23 MED ORDER — ALPRAZOLAM 0.25 MG PO TABS: 0.2500 mg | ORAL_TABLET | Freq: Two times a day (BID) | ORAL | Status: DC | PRN

## 2014-07-23 MED ORDER — HEPARIN (PORCINE) IN NACL 100-0.45 UNIT/ML-% IJ SOLN
2500.0000 [IU]/h | INTRAMUSCULAR | Status: DC
  Administered 2014-07-23: 1150 [IU]/h via INTRAVENOUS
  Administered 2014-07-26 (×3): 2400 [IU]/h via INTRAVENOUS
  Filled 2014-07-23 (×14): qty 250

## 2014-07-23 MED ORDER — NITROGLYCERIN IN D5W 200-5 MCG/ML-% IV SOLN
INTRAVENOUS | Status: DC | PRN
  Administered 2014-07-23: 10 ug/min via INTRAVENOUS

## 2014-07-23 SURGICAL SUPPLY — 14 items
CATH INFINITI 5 FR JL3.5 (CATHETERS) ×2 IMPLANT
CATH INFINITI 5FR ANG PIGTAIL (CATHETERS) ×2 IMPLANT
CATH INFINITI JR4 5F (CATHETERS) ×2 IMPLANT
DEVICE RAD COMP TR BAND LRG (VASCULAR PRODUCTS) ×2 IMPLANT
ELECT DEFIB PAD ADLT CADENCE (PAD) ×2 IMPLANT
GLIDESHEATH SLEND SS 6F .021 (SHEATH) ×2 IMPLANT
HOVERMATT SINGLE USE (MISCELLANEOUS) ×2 IMPLANT
KIT ENCORE 26 ADVANTAGE (KITS) ×2 IMPLANT
KIT HEART LEFT (KITS) ×2 IMPLANT
PACK CARDIAC CATHETERIZATION (CUSTOM PROCEDURE TRAY) ×2 IMPLANT
SYR MEDRAD MARK V 150ML (SYRINGE) ×2 IMPLANT
TRANSDUCER W/STOPCOCK (MISCELLANEOUS) ×2 IMPLANT
TUBING CIL FLEX 10 FLL-RA (TUBING) ×2 IMPLANT
WIRE SAFE-T 1.5MM-J .035X260CM (WIRE) ×2 IMPLANT

## 2014-07-23 NOTE — ED Provider Notes (Signed)
CSN: [id number]     Arrival date & time 07/23/14  1145 History   First MD Initiated Contact with Patient 07/23/14 1201     Chief Complaint  Patient presents with  . Blood Sugar Problem    gl[address] with ems  . Chest Pain     (Consider location/radiation/quality/duration/timing/severity/associated sxs/prior Treatment) HPI Comments: 44 year old female, she is morbidly obese, she has a history of multiple medical problems including diabetes, hypertension, hypercholesterolemia, she smokes cigarettes and has been told that she has interstitial lung disease. She presents to the hospital after developing chest pain last night. This was initially described as a mid chest pain, this morning it is located in the left chest and radiates to her left shoulder and axilla. She tried a breathing treatment at home last night with some improvement, it did not improve her symptoms this morning hence her visit to the hospital. Symptoms are persistent, gradually worsening, currently rated 4 out of 10 and described as a dull ache associated with shortness of breath but no nausea vomiting or diaphoresis.  Patient is a 44 y.o. female presenting with chest pain. The history is provided by the patient and medical records.  Chest Pain   Past Medical History  Diagnosis Date  . Lung disease   . Diabetes mellitus without complication   . Hypertension    History reviewed. No pertinent past surgical history. History reviewed. No pertinent family history. History  Substance Use Topics  . Smoking status: Current Every Day Smoker    Types: Cigarettes  . Smokeless tobacco: Not on file  . Alcohol Use: No   OB History    No data available     Review of Systems  Cardiovascular: Positive for chest pain.  All other systems reviewed and are negative.     Allergies  Wellbutrin  Home Medications   Prior to Admission medications   Medication Sig Start Date End Date Taking? Authorizing Provider  albuterol  (PROVENTIL HFA;VENTOLIN HFA) 108 (90 BASE) MCG/ACT inhaler Inhale into the lungs every 6 (six) hours as needed for wheezing or shortness of breath.    Historical Provider, MD  Calcium Carbonate-Vitamin D (CALCIUM-VITAMIN D) 500-200 MG-UNIT per tablet Take 1 tablet by mouth 2 (two) times daily.    Historical Provider, MD  citalopram (CELEXA) 20 MG tablet Take 20 mg by mouth daily.    Historical Provider, MD  clonazePAM (KLONOPIN) 0.5 MG tablet Take 0.5 mg by mouth 2 (two) times daily as needed for anxiety.    Historical Provider, MD  HYDROcodone-acetaminophen (NORCO/VICODIN) 5-325 MG per tablet Take 1 tablet by mouth every 4 (four) hours as needed (for cough reduction). 03/03/14   Evalee Jefferson, PA-C  lisinopril (PRINIVIL,ZESTRIL) 20 MG tablet Take 20 mg by mouth daily.    Historical Provider, MD  metFORMIN (GLUCOPHAGE) 1000 MG tablet Take 1 tablet (1,000 mg total) by mouth 2 (two) times daily. 03/03/14   Evalee Jefferson, PA-C  omeprazole (PRILOSEC) 40 MG capsule Take 40 mg by mouth daily.    Historical Provider, MD  pravastatin (PRAVACHOL) 20 MG tablet Take 20 mg by mouth daily.    Historical Provider, MD  predniSONE (DELTASONE) 10 MG tablet Take 4 tablets by mouth daily for 1 day, 3 tablets for 2 days, 2 tablets for 2 days, than 1 daily for 2 days. 03/03/14   Evalee Jefferson, PA-C   BP 156/79 mmHg  Pulse 82  Temp(Src) 97.8 F (36.6 C) (Oral)  Resp 18  Ht 5\' 5"  (1.651 m)  Wt 325 lb (147.419 kg)  BMI 54.08 kg/m2  SpO2 100% Physical Exam  Constitutional: She appears well-developed and well-nourished. She appears distressed.  HENT:  Head: Normocephalic and atraumatic.  Mouth/Throat: Oropharynx is clear and moist. No oropharyngeal exudate.  Eyes: Conjunctivae and EOM are normal. Pupils are equal, round, and reactive to light. Right eye exhibits no discharge. Left eye exhibits no discharge. No scleral icterus.  Neck: Normal range of motion. Neck supple. No JVD present. No thyromegaly present.  Cardiovascular:  Normal rate, regular rhythm, normal heart sounds and intact distal pulses.  Exam reveals no gallop and no friction rub.   No murmur heard. Pulmonary/Chest: Effort normal. No respiratory distress. She has no wheezes. She has rales (subtle intermittent rales at the bases).  Abdominal: Soft. Bowel sounds are normal. She exhibits no distension and no mass. There is no tenderness.  Musculoskeletal: Normal range of motion. She exhibits edema (bilateral lower extremity edema, symmetrical). She exhibits no tenderness.  Lymphadenopathy:    She has no cervical adenopathy.  Neurological: She is alert. Coordination normal.  Skin: Skin is warm and dry. No rash noted. No erythema.  Psychiatric: She has a normal mood and affect. Her behavior is normal.  Nursing note and vitals reviewed.   ED Course  Procedures (including critical care time) Labs Review Labs Reviewed  CBC - Abnormal; Notable for the following:    WBC 18.4 (*)    RBC 5.43 (*)    RDW 15.7 (*)    All other components within normal limits  COMPREHENSIVE METABOLIC PANEL - Abnormal; Notable for the following:    Sodium 133 (*)    Chloride 96 (*)    Glucose, Bld 331 (*)    Calcium 8.6 (*)    Albumin 3.3 (*)    All other components within normal limits  CBG MONITORING, ED - Abnormal; Notable for the following:    Glucose-Capillary 346 (*)    All other components within normal limits  APTT  PROTIME-INR  Randolm Idol, ED    Imaging Review Dg Chest Port 1 View  07/23/2014   CLINICAL DATA:  One day history of chest pain  EXAM: PORTABLE CHEST - 1 VIEW  COMPARISON:  March 02, 2014  FINDINGS: Interstitium is somewhat prominent but stable. There are scattered small calcified granulomas in each upper lobe, stable. There is no frank edema or consolidation. Heart is upper normal in size with pulmonary vascularity within normal limits. No adenopathy.  IMPRESSION: Stable interstitial prominence, consistent with a degree of chronic  interstitial pneumonitis of uncertain etiology. There are scattered tiny granulomas bilaterally. There is no frank edema or consolidation. No change in cardiac silhouette allowing for change in technique and positioning.   Electronically Signed   By: Lowella Grip III M.D.   On: 07/23/2014 12:27     EKG Interpretation   Date/Time:  Thursday July 23 2014 11:54:46 EDT Ventricular Rate:  81 PR Interval:  154 QRS Duration: 85 QT Interval:  380 QTC Calculation: 441 R Axis:   9 Text Interpretation:  Sinus rhythm Inferior infarct, acute (RCA) Probable  RV involvement, suggest recording right precordial leads Baseline wander  in lead(s) V2 Since last tracing ** ACUTE MI / STEMI **  NOW PRESENT  Confirmed by Mariapaula Krist  MD, Alinna Siple ([id number]) on 07/23/2014 12:05:22 PM      MDM   Final diagnoses:  Chest pain  ST elevation myocardial infarction (STEMI), unspecified artery    The patient is critically ill with an acute  ST elevation MI. This appears to be inferior leads, 2, 3, aVF area and there is some MRSA typical ST depressions in leads 1 and aVL. She also has some elevations in the lateral precordial leads. I have called a ST elevation MI code, will discussed with cardiology, anticipate transfer to cardiology at [facility] for catheterization. In the meantime the patient will receive aspirin, nitroglycerin, heparin, critical care is currently being provided   Care was discussed with Dr. Martinique at 12:05 PM, he has accepted care of the patient to be transferred immediately to the catheterization lab.  Meds given in ED:  Medications  0.9 %  sodium chloride infusion ( Intravenous Transfusing/Transfer 07/23/14 1229)  aspirin chewable tablet 324 mg (324 mg Oral Given 07/23/14 1209)  heparin injection 4,000 Units (4,000 Units Intravenous Given 07/23/14 1209)  heparin ADULT infusion 100 units/mL (25000 units/250 mL) (12 Units/kg/hr  147.4 kg Intravenous Transfusing/Transfer 07/23/14 1229)   nitroGLYCERIN 50 mg in dextrose 5 % 250 mL (0.2 mg/mL) infusion (5 mcg/min Intravenous Transfusing/Transfer 07/23/14 1229)     CRITICAL CARE Performed by: Noemi Chapel D Total critical care time: 35 Critical care time was exclusive of separately billable procedures and treating other patients. Critical care was necessary to treat or prevent imminent or life-threatening deterioration. Critical care was time spent personally by me on the following activities: development of treatment plan with patient and/or surrogate as well as nursing, discussions with consultants, evaluation of patient's response to treatment, examination of patient, obtaining history from patient or surrogate, ordering and performing treatments and interventions, ordering and review of laboratory studies, ordering and review of radiographic studies, pulse oximetry and re-evaluation of patient's condition.     Noemi Chapel, MD 07/23/14 1256

## 2014-07-23 NOTE — Progress Notes (Signed)
TR band removed 2x2 gauze and tegaderm applied, instructed to  observe arm restrictions and  to call for any bleeding nor swelling.

## 2014-07-23 NOTE — ED Notes (Signed)
Pulses present in all 4 extremities.  Strong radial pulses bilaterally and weak pedal pulses bilaterally.

## 2014-07-23 NOTE — Progress Notes (Signed)
Troponin-2.78, PA aware with no order.

## 2014-07-23 NOTE — H&P (Signed)
Patient ID: Joanna [name] MRN: [id number], DOB/AGE: [birth year]   Admit date: 07/23/2014   Primary Physician: No primary care provider on file. Primary Cardiologist: Linna Hoff (new)  HPI: 44 y/o morbidly obese caucasian female from [place] who is now living in [place] with her sister. She has ILD, DM, HLD, HTN, FM Hx of CAD, and smokes. She has not been able to get her medications for the past several months. Yesterday she started having chest pain. This was initially described as a mid chest pain, this morning it is located in the left chest and radiates to her left shoulder and axilla. She tried a breathing treatment at home last night with some improvement, it did not improve her symptoms this morning hence her visit to the hospital. Symptoms are persistent, gradually worsening, currently rated 4 out of 10 and described as a dull ache associated with shortness of breath but no nausea vomiting or diaphoresis. She presented to the ED in [place] and an EKG showed inferior STEMI. She was treated with NTG, ASA, and Heparin and transferred to [facility] cath lab for further evaluation.    Problem List: Past Medical History  Diagnosis Date  . Interstitial lung disease   . Diabetes mellitus without complication   . Hypertension   . Morbid obesity   . Non compliance w medication regimen   . STEMI (ST elevation myocardial infarction) 07/23/14  . Depression   . Smoker     History reviewed. No pertinent past surgical history.   Allergies:  Allergies  Allergen Reactions  . Wellbutrin [Bupropion] Rash     Home Medications Current Facility-Administered Medications  Medication Dose Route Frequency Provider Last Rate Last Dose  . 0.9 %  sodium chloride infusion   Intravenous Continuous Noemi Chapel, MD 10 mL/hr at 07/23/14 1207 10 mL/hr at 07/23/14 1207  . 0.9 %  sodium chloride infusion  250 mL Intravenous PRN Erlene Quan, PA-C      . acetaminophen (TYLENOL) tablet 650 mg  650 mg Oral  Q4H PRN Luke K Kilroy, PA-C      . albuterol (PROVENTIL HFA;VENTOLIN HFA) 108 (90 BASE) MCG/ACT inhaler 1-2 puff  1-2 puff Inhalation Q6H PRN Erlene Quan, PA-C      . ALPRAZolam Duanne Moron) tablet 0.25 mg  0.25 mg Oral BID PRN Erlene Quan, PA-C      . [START ON 07/24/2014] aspirin EC tablet 81 mg  81 mg Oral Daily Luke K Kilroy, PA-C      . atorvastatin (LIPITOR) tablet 80 mg  80 mg Oral [address] [employer], PA-C      . [START ON 07/24/2014] citalopram (CELEXA) tablet 20 mg  20 mg Oral Daily Luke K Kilroy, PA-C      . fentaNYL (SUBLIMAZE) injection    PRN Peter M Martinique, MD   25 mcg at 07/23/14 1314  . insulin aspart (novoLOG) injection 0-15 Units  0-15 Units Subcutaneous TID WC Luke K Kilroy, PA-C      . insulin aspart (novoLOG) injection 0-5 Units  0-5 Units Subcutaneous QHS Luke K Kilroy, PA-C      . lidocaine (PF) (XYLOCAINE) 1 % injection    PRN Peter M Martinique, MD   5 mL at 07/23/14 1313  . [START ON 07/24/2014] lisinopril (PRINIVIL,ZESTRIL) tablet 20 mg  20 mg Oral Daily Luke K Kilroy, PA-C      . metoprolol tartrate (LOPRESSOR) tablet 12.5 mg  12.5 mg Oral BID Erlene Quan, PA-C      .  midazolam (VERSED) injection    PRN Peter M Martinique, MD   1 mg at 07/23/14 1314  . nitroGLYCERIN (NITROSTAT) SL tablet 0.4 mg  0.4 mg Sublingual Q5 Min x 3 PRN Erlene Quan, PA-C      . nitroGLYCERIN 50 mg in dextrose 5 % 250 mL (0.2 mg/mL) infusion    Continuous PRN Peter M Martinique, MD 3 mL/hr at 07/23/14 1308 10 mcg/min at 07/23/14 1308  . ondansetron (ZOFRAN) injection 4 mg  4 mg Intravenous Q6H PRN Erlene Quan, PA-C      . [START ON 07/24/2014] pantoprazole (PROTONIX) EC tablet 40 mg  40 mg Oral Q0600 Doreene Burke Kilroy, PA-C      . potassium chloride SA (K-DUR,KLOR-CON) CR tablet 40 mEq  40 mEq Oral Once [employer], PA-C      . sodium chloride 0.9 % injection 3 mL  3 mL Intravenous Q12H Luke K Kilroy, PA-C      . sodium chloride 0.9 % injection 3 mL  3 mL Intravenous PRN Doreene Burke Kilroy, PA-C      .  ticagrelor [facility]) tablet    PRN Peter M Martinique, MD   180 mg at 07/23/14 1334  . zolpidem (AMBIEN) tablet 5 mg  5 mg Oral QHS PRN Erlene Quan, PA-C         Family History  Problem Relation Age of Onset  . Coronary artery disease Father 47    CABG     History   Social History  . Marital Status: Single    Spouse Name: N/A  . Number of Children: N/A  . Years of Education: N/A   Occupational History  . Not on file.   Social History Main Topics  . Smoking status: Current Every Day Smoker    Types: Cigarettes  . Smokeless tobacco: Not on file  . Alcohol Use: No  . Drug Use: No  . Sexual Activity: Not on file   Other Topics Concern  . Not on file   Social History Narrative     Review of Systems: General: negative for chills, fever, night sweats or weight changes.  Cardiovascular: negative for chest pain, dyspnea on exertion, edema, orthopnea, palpitations, paroxysmal nocturnal dyspnea or shortness of breath Dermatological: negative for rash Respiratory: negative for cough or wheezing Urologic: negative for hematuria Abdominal: negative for nausea, vomiting, diarrhea, bright red blood per rectum, melena, or hematemesis Neurologic: negative for visual changes, syncope, or dizziness All other systems reviewed and are otherwise negative except as noted above.  Physical Exam: Blood pressure 156/79, pulse 82, temperature 97.8 F (36.6 C), temperature source Oral, resp. rate 18, height 5\' 5"  (1.651 m), weight 325 lb (147.419 kg), SpO2 100 %.  General appearance: alert, cooperative, no distress and morbidly obese Neck: no JVD Lungs: decreased breath sounds Heart: regular rate and rhythm Abdomen: morbid obesity Extremities: 2+ edema with chronic LE edema Pulses: good radial pulse Skin: cool pale and dry Neurologic: Grossly normal   Labs:   Results for orders placed or performed during the hospital encounter of 07/23/14 (from the past 24 hour(s))  CBG monitoring,  ED     Status: Abnormal   Collection Time: 07/23/14 12:01 PM  Result Value Ref Range   Glucose-Capillary 346 (H) 65 - 99 mg/dL  APTT     Status: None   Collection Time: 07/23/14 12:05 PM  Result Value Ref Range   aPTT 26 24 - 37 seconds  CBC  Status: Abnormal   Collection Time: 07/23/14 12:05 PM  Result Value Ref Range   WBC 18.4 (H) 4.0 - 10.5 K/uL   RBC 5.43 (H) 3.87 - 5.11 MIL/uL   Hemoglobin 14.3 12.0 - 15.0 g/dL   HCT 43.1 36.0 - 46.0 %   MCV 79.4 78.0 - 100.0 fL   MCH 26.3 26.0 - 34.0 pg   MCHC 33.2 30.0 - 36.0 g/dL   RDW 15.7 (H) 11.5 - 15.5 %   Platelets 301 150 - 400 K/uL  Comprehensive metabolic panel     Status: Abnormal   Collection Time: 07/23/14 12:05 PM  Result Value Ref Range   Sodium 133 (L) 135 - 145 mmol/L   Potassium 3.5 3.5 - 5.1 mmol/L   Chloride 96 (L) 101 - 111 mmol/L   CO2 26 22 - 32 mmol/L   Glucose, Bld 331 (H) 65 - 99 mg/dL   BUN 12 6 - 20 mg/dL   Creatinine, Ser 0.73 0.44 - 1.00 mg/dL   Calcium 8.6 (L) 8.9 - 10.3 mg/dL   Total Protein 7.8 6.5 - 8.1 g/dL   Albumin 3.3 (L) 3.5 - 5.0 g/dL   AST 29 15 - 41 U/L   ALT 14 14 - 54 U/L   Alkaline Phosphatase 90 38 - 126 U/L   Total Bilirubin 0.4 0.3 - 1.2 mg/dL   GFR calc non Af Amer >60 >60 mL/min   GFR calc Af Amer >60 >60 mL/min   Anion gap 11 5 - 15  Protime-INR     Status: None   Collection Time: 07/23/14 12:05 PM  Result Value Ref Range   Prothrombin Time 14.5 11.6 - 15.2 seconds   INR 1.11 0.00 - 1.49     Radiology/Studies: Dg Chest Port 1 View  07/23/2014   CLINICAL DATA:  One day history of chest pain  EXAM: PORTABLE CHEST - 1 VIEW  COMPARISON:  March 02, 2014  FINDINGS: Interstitium is somewhat prominent but stable. There are scattered small calcified granulomas in each upper lobe, stable. There is no frank edema or consolidation. Heart is upper normal in size with pulmonary vascularity within normal limits. No adenopathy.  IMPRESSION: Stable interstitial prominence, consistent with a  degree of chronic interstitial pneumonitis of uncertain etiology. There are scattered tiny granulomas bilaterally. There is no frank edema or consolidation. No change in cardiac silhouette allowing for change in technique and positioning.   Electronically Signed   By: Lowella Grip III M.D.   On: 07/23/2014 12:27    EKG:NSR, inferior Qs and ST elelvation  ASSESSMENT AND PLAN:  Principal Problem:   STEMI (ST elevation myocardial infarction) Active Problems:   Interstitial lung disease   Diabetes mellitus without complication   Hypertension   Non compliance w medication regimen   Morbid obesity   Depression   Smoker   Family history of coronary artery disease in father   PLAN: Urgent cath per Dr Martinique. Will arrange for her to be established in [place] after discharge. Add statin, beta blocker, resume ACE in am.    SignedErlene Quan, PA-C 07/23/2014, 1:36 PM [phone]  Patient seen and examined and history reviewed. Agree with above findings and plan. 44 yo WF with multiple cardiac risk factors presents in transfer from [facility] with an inferior STEMI. Began experiencing chest pain last night. Pain unresolved. Ecg shows new ST elevation in the inferior leads. Patient states she has been without medication for several month due to move from [place].  Continues to smoke. On exam she appears comfortable. Lungs with decreased BS. No rales. CV exam without murmur or rub. 2+ edema. Morbidly obese. Will proceed with emergent cardiac cath. She needs high dose statin. SSI for DM management initially. Counsel on smoking cessation. Aggressive risk factor modification.  Peter Martinique, [place] 07/23/2014 2:07 PM

## 2014-07-23 NOTE — ED Notes (Signed)
O2@ @ L/m

## 2014-07-23 NOTE — Progress Notes (Signed)
ANTICOAGULATION CONSULT NOTE - Initial Consult  Pharmacy Consult for Heparin Indication: STEMI  Allergies  Allergen Reactions  . Wellbutrin [Bupropion] Rash    Patient Measurements: Height: 5\' 6"  (167.6 cm) Weight: (!) 313 lb 15 oz (142.4 kg) IBW/kg (Calculated) : 59.3 Heparin Dosing Weight: 94.5 kg  Vital Signs: Temp: 97.8 F (36.6 C) (06/16 1154) Temp Source: Oral (06/16 1154) BP: 158/77 mmHg (06/16 1615) Pulse Rate: 82 (06/16 1615)  Labs:  Recent Labs  07/23/14 1205  HGB 14.3  HCT 43.1  PLT 301  APTT 26  LABPROT 14.5  INR 1.11  CREATININE 0.73    Estimated Creatinine Clearance: 129.7 mL/min (by C-G formula based on Cr of 0.73).   Medical History: Past Medical History  Diagnosis Date  . Interstitial lung disease   . Diabetes mellitus without complication   . Hypertension   . Morbid obesity   . Non compliance w medication regimen   . STEMI (ST elevation myocardial infarction) 07/23/14  . Depression   . Smoker     Medications:  Prescriptions prior to admission  Medication Sig Dispense Refill Last Dose  . atorvastatin (LIPITOR) 20 MG tablet Take 20 mg by mouth daily.     . clonazePAM (KLONOPIN) 0.5 MG tablet Take 0.5 mg by mouth 2 (two) times daily.     . fluticasone-salmeterol (ADVAIR HFA) 115-21 MCG/ACT inhaler Inhale 2 puffs into the lungs 2 (two) times daily.     Marland Kitchen levonorgestrel (MIRENA) 20 MCG/24HR IUD by Intrauterine route.     Marland Kitchen omeprazole (PRILOSEC) 20 MG capsule Take 20 mg by mouth daily.     Marland Kitchen albuterol (PROVENTIL HFA;VENTOLIN HFA) 108 (90 BASE) MCG/ACT inhaler Inhale into the lungs every 6 (six) hours as needed for wheezing or shortness of breath.   03/03/2014  . Calcium Carbonate-Vitamin D (CALCIUM-VITAMIN D) 500-200 MG-UNIT per tablet Take 1 tablet by mouth 2 (two) times daily.   03/03/2014  . citalopram (CELEXA) 20 MG tablet Take 20 mg by mouth daily.   03/03/2014  . clonazePAM (KLONOPIN) 0.5 MG tablet Take 0.5 mg by mouth 2 (two) times daily  as needed for anxiety.   Past Month  . HYDROcodone-acetaminophen (NORCO/VICODIN) 5-325 MG per tablet Take 1 tablet by mouth every 4 (four) hours as needed (for cough reduction). 20 tablet 0   . lisinopril (PRINIVIL,ZESTRIL) 20 MG tablet Take 20 mg by mouth daily.   03/03/2014  . metFORMIN (GLUCOPHAGE) 1000 MG tablet Take 1 tablet (1,000 mg total) by mouth 2 (two) times daily. 30 tablet 0   . omeprazole (PRILOSEC) 40 MG capsule Take 40 mg by mouth daily.   Past Month  . pravastatin (PRAVACHOL) 20 MG tablet Take 20 mg by mouth daily.   03/03/2014  . predniSONE (DELTASONE) 10 MG tablet Take 4 tablets by mouth daily for 1 day, 3 tablets for 2 days, 2 tablets for 2 days, than 1 daily for 2 days. 16 tablet 0     Assessment: 44 y/o F with h/o multiple medication problems + tobacco and ILD presents with CP. MD note says she has not been able to get her medications for the past several months. +STEMI. 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 MD would like to continue tirofiban and add heparin and consider repeat angiography in 3-4 days to reassess lesion severity.  Goal of Therapy:  Heparin level 0.3-0.5 units/ml Monitor platelets by anticoagulation protocol: Yes   Plan:  Tirofiban 0.15 mcg/kg/min continuous Start IV heparin 8 hrs post sheath removal (1339) at 1150 units/hr at 2140. Daily HL and CBC   Korina Tretter S. Alford Highland, PharmD, BCPS Clinical Staff Pharmacist Pager [phone]  Eilene Ghazi Stillinger 07/23/2014,4:28 PM

## 2014-07-23 NOTE — ED Notes (Signed)
Having chest pain last know.  Pt says she has lung disease and given self breathing treatment with relief.  Did not sleep last night, second episode of chest discomfort started at 0700 and gave self another breathing treatment with not relief.  C/o pain to left arm.  Rates pain 3-4/10.  Did not take any pain medication this am.

## 2014-07-24 ENCOUNTER — Inpatient Hospital Stay (HOSPITAL_COMMUNITY): Payer: Medicaid Other

## 2014-07-24 DIAGNOSIS — I2510 Atherosclerotic heart disease of native coronary artery without angina pectoris: Secondary | ICD-10-CM

## 2014-07-24 LAB — BASIC METABOLIC PANEL
ANION GAP: 8 (ref 5–15)
BUN: 8 mg/dL (ref 6–20)
CALCIUM: 8.3 mg/dL — AB (ref 8.9–10.3)
CO2: 27 mmol/L (ref 22–32)
Chloride: 99 mmol/L — ABNORMAL LOW (ref 101–111)
Creatinine, Ser: 0.72 mg/dL (ref 0.44–1.00)
GFR calc Af Amer: >60 mL/min (ref >60)
GLUCOSE: 192 mg/dL — AB (ref 65–99)
Potassium: 3.8 mmol/L (ref 3.5–5.1)
Sodium: 134 mmol/L — ABNORMAL LOW (ref 135–145)

## 2014-07-24 LAB — HEPARIN LEVEL (UNFRACTIONATED): Heparin Unfractionated: <0.1 IU/mL — ABNORMAL LOW (ref 0.30–0.70)

## 2014-07-24 LAB — GLUCOSE, CAPILLARY
GLUCOSE-CAPILLARY: 239 mg/dL — AB (ref 65–99)
Glucose-Capillary: 253 mg/dL — ABNORMAL HIGH (ref 65–99)
Glucose-Capillary: 260 mg/dL — ABNORMAL HIGH (ref 65–99)
Glucose-Capillary: 246 mg/dL — ABNORMAL HIGH (ref 65–99)

## 2014-07-24 LAB — LIPID PANEL
Cholesterol: 210 mg/dL — ABNORMAL HIGH (ref 0–200)
HDL: 20 mg/dL — ABNORMAL LOW (ref >40)
LDL Cholesterol: 125 mg/dL — ABNORMAL HIGH (ref 0–99)
Total CHOL/HDL Ratio: 10.5 RATIO
Triglycerides: 327 mg/dL — ABNORMAL HIGH (ref <150)
VLDL: 65 mg/dL — ABNORMAL HIGH (ref 0–40)

## 2014-07-24 LAB — CBC
HEMATOCRIT: 40.7 % (ref 36.0–46.0)
Hemoglobin: 13.1 g/dL (ref 12.0–15.0)
MCH: 25.7 pg — ABNORMAL LOW (ref 26.0–34.0)
MCHC: 32.2 g/dL (ref 30.0–36.0)
MCV: 79.8 fL (ref 78.0–100.0)
PLATELETS: 320 10*3/uL (ref 150–400)
RBC: 5.1 MIL/uL (ref 3.87–5.11)
RDW: 15.8 % — AB (ref 11.5–15.5)
WBC: 18.3 10*3/uL — AB (ref 4.0–10.5)

## 2014-07-24 LAB — HEMOGLOBIN A1C
Hgb A1c MFr Bld: 11.9 % — ABNORMAL HIGH (ref 4.8–5.6)
MEAN PLASMA GLUCOSE: 295 mg/dL

## 2014-07-24 LAB — TROPONIN I: Troponin I: 3.06 ng/mL (ref <0.031)

## 2014-07-24 MED ORDER — NITROGLYCERIN IN D5W 200-5 MCG/ML-% IV SOLN
0.0000 ug/min | INTRAVENOUS | Status: DC
  Administered 2014-07-24: 10 ug/min via INTRAVENOUS
  Filled 2014-07-24: qty 250

## 2014-07-24 MED ORDER — METOPROLOL TARTRATE 25 MG PO TABS
25.0000 mg | ORAL_TABLET | Freq: Three times a day (TID) | ORAL | Status: DC
  Administered 2014-07-24 – 2014-07-25 (×4): 25 mg via ORAL
  Filled 2014-07-24 (×7): qty 1

## 2014-07-24 MED ORDER — AMLODIPINE BESYLATE 5 MG PO TABS
5.0000 mg | ORAL_TABLET | Freq: Every day | ORAL | Status: DC
  Administered 2014-07-24 – 2014-07-28 (×5): 5 mg via ORAL
  Filled 2014-07-24 (×5): qty 1

## 2014-07-24 MED ORDER — HEPARIN BOLUS VIA INFUSION
3000.0000 [IU] | Freq: Once | INTRAVENOUS | Status: AC
  Administered 2014-07-24: 3000 [IU] via INTRAVENOUS
  Filled 2014-07-24: qty 3000

## 2014-07-24 MED FILL — Verapamil HCl IV Soln 2.5 MG/ML: INTRAVENOUS | Qty: 2 | Status: AC

## 2014-07-24 MED FILL — Heparin Sodium (Porcine) 2 Unit/ML in Sodium Chloride 0.9%: INTRAMUSCULAR | Qty: 1000 | Status: AC

## 2014-07-24 NOTE — Progress Notes (Signed)
[place] for Heparin Indication: STEMI  Allergies  Allergen Reactions  . Wellbutrin [Bupropion] Rash    Patient Measurements: Height: 5\' 6"  (167.6 cm) Weight: (!) 313 lb 11.4 oz (142.3 kg) IBW/kg (Calculated) : 59.3 Heparin Dosing Weight: 94.5 kg  Vital Signs: Temp: 98.1 F (36.7 C) (06/17 0755) Temp Source: Oral (06/17 0755) BP: 117/39 mmHg (06/17 1129) Pulse Rate: 70 (06/17 0755)  Labs:  Recent Labs  07/23/14 1205 07/23/14 1645 07/23/14 1940 07/24/14 0126 07/24/14 1017  HGB 14.3  --   --  13.1  --   HCT 43.1  --   --  40.7  --   PLT 301  --   --  320  --   APTT 26  --   --   --   --   LABPROT 14.5  --   --   --   --   INR 1.11  --   --   --   --   HEPARINUNFRC  --   --   --  <0.10* <0.10*  CREATININE 0.73  --   --  0.72  --   TROPONINI  --  2.78* 3.32* 3.06*  --     Estimated Creatinine Clearance: 129.7 mL/min (by C-G formula based on Cr of 0.72).   Medical History: Past Medical History  Diagnosis Date  . Interstitial lung disease   . Diabetes mellitus without complication   . Hypertension   . Morbid obesity   . Non compliance w medication regimen   . STEMI (ST elevation myocardial infarction) 07/23/14  . Depression   . Smoker     Medications:  Prescriptions prior to admission  Medication Sig Dispense Refill Last Dose  . levonorgestrel (MIRENA) 20 MCG/24HR IUD 1 each by Intrauterine route once.    2 years    Assessment: 44 y/o F with h/o multiple medication problems + tobacco and ILD presents with CP. MD note says she has not been able to get her medications for the past several months. +STEMI. 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 MD would like to continue tirofiban and add heparin and consider repeat angiography early next week to reassess lesion severity.  HL still undetectable after rate change this morning. No bleeding noted, cbc  stable. Tirofiban continues at low rate. Still having residual chest pain.    Goal of Therapy:  Heparin level 0.3-0.5 units/ml Monitor platelets by anticoagulation protocol: Yes   Plan:  Continue Tirofiban 0.15 mcg/kg/min  Rebolus heparin 3000 units then increase heparin rate to 1800 units/hr Recheck HL tonight Daily HL and CBC  Erin Hearing PharmD., BCPS Clinical Pharmacist Pager [phone] 07/24/2014 11:48 AM

## 2014-07-24 NOTE — Progress Notes (Signed)
Utilization review complete. Destaney Sarkis RN CCM Case Mgmt phone [phone] 

## 2014-07-24 NOTE — Progress Notes (Signed)
  Echocardiogram 2D Echocardiogram has been performed.  Joanna [name] 07/24/2014, 10:59 AM

## 2014-07-24 NOTE — Progress Notes (Signed)
[facility] for Heparin Indication: STEMI  Allergies  Allergen Reactions  . Wellbutrin [Bupropion] Rash    Patient Measurements: Height: 5\' 6"  (167.6 cm) Weight: (!) 313 lb 11.4 oz (142.3 kg) IBW/kg (Calculated) : 59.3 Heparin Dosing Weight: 94.5 kg  Vital Signs: Temp: 97.5 F (36.4 C) (06/17 1600) Temp Source: Oral (06/17 1600) BP: 120/65 mmHg (06/17 1800) Pulse Rate: 78 (06/17 1800)  Labs:  Recent Labs  07/23/14 1205 07/23/14 1645 07/23/14 1940 07/24/14 0126 07/24/14 1017 07/24/14 1804  HGB 14.3  --   --  13.1  --   --   HCT 43.1  --   --  40.7  --   --   PLT 301  --   --  320  --   --   APTT 26  --   --   --   --   --   LABPROT 14.5  --   --   --   --   --   INR 1.11  --   --   --   --   --   HEPARINUNFRC  --   --   --  <0.10* <0.10* <0.10*  CREATININE 0.73  --   --  0.72  --   --   TROPONINI  --  2.78* 3.32* 3.06*  --   --     Estimated Creatinine Clearance: 129.7 mL/min (by C-G formula based on Cr of 0.72).   Medical History: Past Medical History  Diagnosis Date  . Interstitial lung disease   . Diabetes mellitus without complication   . Hypertension   . Morbid obesity   . Non compliance w medication regimen   . STEMI (ST elevation myocardial infarction) 07/23/14  . Depression   . Smoker     Medications:  Prescriptions prior to admission  Medication Sig Dispense Refill Last Dose  . levonorgestrel (MIRENA) 20 MCG/24HR IUD 1 each by Intrauterine route once.    2 years    Assessment: 44 y/o F with h/o multiple medication problems + tobacco and ILD presents with CP. MD note says she has not been able to get her medications for the past several months. +STEMI. 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 MD would like to continue tirofiban and add heparin and consider repeat angiography early next week to reassess lesion severity.  HL still  undetectable after rate change. No bleeding noted, cbc stable. Tirofiban continues at low rate.     Goal of Therapy:  Heparin level 0.3-0.5 units/ml Monitor platelets by anticoagulation protocol: Yes   Plan:  Continue Tirofiban 0.15 mcg/kg/min  Increase heparin to 2100 units / hr Daily HL and CBC  Thank you Anette Guarneri, PharmD [phone]  07/24/2014 7:23 PM

## 2014-07-24 NOTE — Progress Notes (Signed)
Subjective:  Still complains of mild chest pressure on NTG drip at only 10 micrograms   Objective:   Vital Signs : Filed Vitals:   07/24/14 0346 07/24/14 0400 07/24/14 0600 07/24/14 0755  BP:  120/60 135/64 175/83  Pulse:  60 70 70  Temp:  98.8 F (37.1 C)  98.1 F (36.7 C)  TempSrc:  Oral  Oral  Resp:  21 23 20   Height:      Weight: 313 lb 11.4 oz (142.3 kg)     SpO2:  95% 96% 96%    Intake/Output from previous day:  Intake/Output Summary (Last 24 hours) at 07/24/14 0947 Last data filed at 07/24/14 0600  Gross per 24 hour  Intake 1011.57 ml  Output    800 ml  Net 211.57 ml    I/O since admission: +211.6  Wt Readings from Last 3 Encounters:  07/24/14 313 lb 11.4 oz (142.3 kg)  03/02/14 352 lb (159.666 kg)    Medications: . aspirin EC  81 mg Oral Daily  . atorvastatin  80 mg Oral q1800  . Chlorhexidine Gluconate Cloth  6 each Topical Q0600  . citalopram  20 mg Oral Daily  . insulin aspart  0-15 Units Subcutaneous TID WC  . insulin aspart  0-5 Units Subcutaneous QHS  . lisinopril  20 mg Oral Daily  . metoprolol tartrate  25 mg Oral BID  . mupirocin ointment  1 application Nasal BID  . nitroGLYCERIN  5 mcg/min Intravenous Once  . pantoprazole  40 mg Oral Q0600  . pneumococcal 23 valent vaccine  0.5 mL Intramuscular Tomorrow-1000  . potassium chloride  40 mEq Oral Once  . sodium chloride  3 mL Intravenous Q12H  . sodium chloride  3 mL Intravenous Q12H  . ticagrelor  90 mg Oral BID    . sodium chloride 10 mL/hr (07/23/14 1207)  . heparin 1,500 Units/hr (07/24/14 0309)  . tirofiban 0.15 mcg/kg/min (07/24/14 0745)    Physical Exam:   General appearance: hirsuite;  alert, cooperative and no distress; morbidly obese Neck: thick neck; no adenopathy, no carotid bruit, no JVD, supple, symmetrical, trachea midline and thyroid not enlarged, symmetric, no tenderness/mass/nodules Lungs: clear to auscultation bilaterally Heart: regular rate and rhythm 1/6  sem Abdomen: obese;soft, non-tender; bowel sounds normal; no masses,  no organomegaly Extremities: no edema, redness or tenderness in the calves or thighs Pulses: 2+ and symmetric; R radial cath site stable Skin: Skin color, texture, turgor normal. No rashes or lesions Neurologic: Grossly normal   Rate: 82  Rhythm: normal sinus rhythm   07/24/14 ECG (independently read by me):NSR at 66 with resolution of inferior ST elevation nowe with inverted T wave 2,3,aVF.  07/23/14 ECG (independently read by me): NSR 1 mm ST elevation inferiorly  Lab Results:   Recent Labs  07/23/14 1205 07/24/14 0126  NA 133* 134*  K 3.5 3.8  CL 96* 99*  CO2 26 27  GLUCOSE 331* 192*  BUN 12 8  CREATININE 0.73 0.72  CALCIUM 8.6* 8.3*    Hepatic Function Latest Ref Rng 07/23/2014 03/02/2014  Total Protein 6.5 - 8.1 g/dL 7.8 7.2  Albumin 3.5 - 5.0 g/dL 3.3(L) 3.2(L)  AST 15 - 41 U/L 29 55(H)  ALT 14 - 54 U/L 14 37(H)  Alk Phosphatase 38 - 126 U/L 90 101  Total Bilirubin 0.3 - 1.2 mg/dL 0.4 0.5     Recent Labs  07/23/14 1205 07/24/14 0126  WBC 18.4* 18.3*  HGB 14.3 13.1  HCT  43.1 40.7  MCV 79.4 79.8  PLT 301 320     Recent Labs  07/23/14 1645 07/23/14 1940 07/24/14 0126  TROPONINI 2.78* 3.32* 3.06*    Lab Results  Component Value Date   TSH 3.540 07/23/2014    Recent Labs  07/23/14 1645  HGBA1C 11.9*     Recent Labs  07/23/14 1205  PROT 7.8  ALBUMIN 3.3*  AST 29  ALT 14  ALKPHOS 90  BILITOT 0.4    Recent Labs  07/23/14 1205  INR 1.11   BNP (last 3 results) No results for input(s): BNP in the last 8760 hours.  ProBNP (last 3 results) No results for input(s): PROBNP in the last 8760 hours.   Lipid Panel     Component Value Date/Time   CHOL 210* 07/24/2014 0126   TRIG 327* 07/24/2014 0126   HDL 20* 07/24/2014 0126   CHOLHDL 10.5 07/24/2014 0126   VLDL 65* 07/24/2014 0126   LDLCALC 125* 07/24/2014 0126     Imaging:  Dg Chest Port 1  View  07/23/2014   CLINICAL DATA:  One day history of chest pain  EXAM: PORTABLE CHEST - 1 VIEW  COMPARISON:  March 02, 2014  FINDINGS: Interstitium is somewhat prominent but stable. There are scattered small calcified granulomas in each upper lobe, stable. There is no frank edema or consolidation. Heart is upper normal in size with pulmonary vascularity within normal limits. No adenopathy.  IMPRESSION: Stable interstitial prominence, consistent with a degree of chronic interstitial pneumonitis of uncertain etiology. There are scattered tiny granulomas bilaterally. There is no frank edema or consolidation. No change in cardiac silhouette allowing for change in technique and positioning.   Electronically Signed   By: Lowella Grip III M.D.   On: 07/23/2014 12:27    EMERGENT CARDIAC CATHETERIZATION: 07/23/14 Conclusion     Prox RCA to Mid RCA lesion, 75% stenosed.  The left ventricular systolic function is normal.  Mid LAD lesion, 30% stenosed.  1. 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 There is TIMI 3 flow.  2. Normal LV function.  Recommendations: I would recommend aggressive antiplatelet and antithrombotic therapy with ASA, Brilinta, IV heparin and IV tirofiban. I would stop tirofiban after 48 hours. Consider repeat angiography in 3-4 days to reassess lesion severity. Patient needs aggressive risk factor modification.      Assessment/Plan:   Principal Problem:   STEMI (ST elevation myocardial infarction) Active Problems:   Interstitial lung disease   Diabetes mellitus without complication   Hypertension   Morbid obesity   Non compliance w medication regimen   Depression   Smoker   Family history of coronary artery disease in father   ST elevation myocardial infarction (STEMI) involving right coronary artery with complication  1. Day 1 s/p STEMI secondary to long thrombotic lesion in the proximal RCA. P3eak troponin 3.32 2.  HTN: 3. Hyperlipidemia: 4. Poorly controlled DM:  HbA1c  11.9 5. Morbid Obesity 6. Tobacco Abuse  Still with very mild residual chest tightness. Will increase iv NTG; continue heparin until relook angio early next week. Increase Metoprolol to 25 mg every 8 hrs today and titrate tomorrow to 50 mg bid if BP, HR allow.  Will add amlodipine with increased BP.  Continue atorvastatin  80 mg; will add fenofibrate to statin with inc TG and VLDL, and very low HDL with atherogenic dyslipidemic lipid panel.  Troy Sine, MD, [facility] 07/24/2014, 9:47 AM

## 2014-07-24 NOTE — Progress Notes (Signed)
Inpatient Diabetes Program Recommendations  AACE/ADA: New Consensus Statement on Inpatient Glycemic Control (2013)  Target Ranges:  Prepandial:   less than 140 mg/dL      Peak postprandial:   less than 180 mg/dL (1-2 hours)      Critically ill patients:  140 - 180 mg/dL   Inpatient Diabetes Program Recommendations Insulin - Basal: add Lantus 20 units  Correction (SSI): Marland Kitchen Thank you  Raoul Pitch BSN, RN,CDE Inpatient Diabetes Coordinator [phone] (team pager)

## 2014-07-24 NOTE — Progress Notes (Signed)
ANTICOAGULATION CONSULT NOTE - Follow Up Consult  Pharmacy Consult for heparin Indication: STEMI   Labs:  Recent Labs  07/23/14 1205 07/23/14 1645 07/23/14 1940 07/24/14 0126  HGB 14.3  --   --  13.1  HCT 43.1  --   --  40.7  PLT 301  --   --  320  APTT 26  --   --   --   LABPROT 14.5  --   --   --   INR 1.11  --   --   --   HEPARINUNFRC  --   --   --  <0.10*  CREATININE 0.73  --   --   --   TROPONINI  --  2.78* 3.32*  --      Assessment: 44yo female undetectable on heparin with initial dosing s/p STEMI; lab drawn early but pt likely needs higher rate given wt/age.  Goal of Therapy:  Heparin level 0.3-0.5 units/ml   Plan:  Will increase heparin gtt by 3 units/kgABW/hr to 1500 units/hr and check level in 6hr.  Wynona Neat, PharmD, BCPS  07/24/2014,3:11 AM

## 2014-07-25 DIAGNOSIS — I213 ST elevation (STEMI) myocardial infarction of unspecified site: Secondary | ICD-10-CM

## 2014-07-25 LAB — GLUCOSE, CAPILLARY
GLUCOSE-CAPILLARY: 199 mg/dL — AB (ref 65–99)
GLUCOSE-CAPILLARY: 197 mg/dL — AB (ref 65–99)
GLUCOSE-CAPILLARY: 209 mg/dL — AB (ref 65–99)
Glucose-Capillary: 209 mg/dL — ABNORMAL HIGH (ref 65–99)

## 2014-07-25 LAB — HEPARIN LEVEL (UNFRACTIONATED)
HEPARIN UNFRACTIONATED: 0.41 [IU]/mL (ref 0.30–0.70)
Heparin Unfractionated: 0.27 IU/mL — ABNORMAL LOW (ref 0.30–0.70)
Heparin Unfractionated: 0.24 IU/mL — ABNORMAL LOW (ref 0.30–0.70)

## 2014-07-25 LAB — CBC
HEMATOCRIT: 38.5 % (ref 36.0–46.0)
Hemoglobin: 12.6 g/dL (ref 12.0–15.0)
MCH: 25.8 pg — ABNORMAL LOW (ref 26.0–34.0)
MCHC: 32.7 g/dL (ref 30.0–36.0)
MCV: 78.9 fL (ref 78.0–100.0)
PLATELETS: 253 10*3/uL (ref 150–400)
RBC: 4.88 MIL/uL (ref 3.87–5.11)
RDW: 16.2 % — AB (ref 11.5–15.5)
WBC: 12.8 10*3/uL — ABNORMAL HIGH (ref 4.0–10.5)

## 2014-07-25 NOTE — Progress Notes (Signed)
Subjective:  Still complains of mild chest pressure on NTG drip at only 10 micrograms   Objective:   Vital Signs : Filed Vitals:   07/25/14 0600 07/25/14 0700 07/25/14 1029 07/25/14 1206  BP: 113/86 109/56 117/80 101/61  Pulse: 64     Temp:  98.3 F (36.8 C)  98.5 F (36.9 C)  TempSrc:  Oral  Oral  Resp: 24 18  18   Height:      Weight:      SpO2: 96% 97%  97%    Intake/Output from previous day:  Intake/Output Summary (Last 24 hours) at 07/25/14 1224 Last data filed at 07/25/14 1000  Gross per 24 hour  Intake   2314 ml  Output      2 ml  Net   2312 ml    I/O since admission: +211.6  Wt Readings from Last 3 Encounters:  07/25/14 142.883 kg (315 lb)  03/02/14 159.666 kg (352 lb)    Medications: . amLODipine  5 mg Oral Daily  . aspirin EC  81 mg Oral Daily  . atorvastatin  80 mg Oral q1800  . Chlorhexidine Gluconate Cloth  6 each Topical Q0600  . citalopram  20 mg Oral Daily  . insulin aspart  0-15 Units Subcutaneous TID WC  . insulin aspart  0-5 Units Subcutaneous QHS  . lisinopril  20 mg Oral Daily  . metoprolol tartrate  25 mg Oral Q8H  . mupirocin ointment  1 application Nasal BID  . pantoprazole  40 mg Oral Q0600  . potassium chloride  40 mEq Oral Once  . sodium chloride  3 mL Intravenous Q12H  . sodium chloride  3 mL Intravenous Q12H  . ticagrelor  90 mg Oral BID    . sodium chloride 10 mL/hr (07/23/14 1207)  . heparin 2,250 Units/hr (07/25/14 0500)  . nitroGLYCERIN 10 mcg/min (07/24/14 2201)  . tirofiban 0.15 mcg/kg/min (07/24/14 2201)    Physical Exam:   General appearance: hirsuite;  alert, cooperative and no distress; morbidly obese Neck: thick neck; no adenopathy, no carotid bruit, no JVD, supple, symmetrical, trachea midline and thyroid not enlarged, symmetric, no tenderness/mass/nodules Lungs: clear to auscultation bilaterally Heart: regular rate and rhythm 1/6 sem Abdomen: obese;soft, non-tender; bowel sounds normal; no masses,  no  organomegaly Extremities: no edema, redness or tenderness in the calves or thighs Pulses: 2+ and symmetric; R radial cath site stable Skin: Skin color, texture, turgor normal. No rashes or lesions Neurologic: Grossly normal   Rate: 82  Rhythm: normal sinus rhythm   07/24/14 ECG (independently read by me):NSR at 66 with resolution of inferior ST elevation nowe with inverted T wave 2,3,aVF.  07/23/14 ECG (independently read by me): NSR 1 mm ST elevation inferiorly  Lab Results:   Recent Labs  07/23/14 1205 07/24/14 0126  NA 133* 134*  K 3.5 3.8  CL 96* 99*  CO2 26 27  GLUCOSE 331* 192*  BUN 12 8  CREATININE 0.73 0.72  CALCIUM 8.6* 8.3*    Hepatic Function Latest Ref Rng 07/23/2014 03/02/2014  Total Protein 6.5 - 8.1 g/dL 7.8 7.2  Albumin 3.5 - 5.0 g/dL 3.3(L) 3.2(L)  AST 15 - 41 U/L 29 55(H)  ALT 14 - 54 U/L 14 37(H)  Alk Phosphatase 38 - 126 U/L 90 101  Total Bilirubin 0.3 - 1.2 mg/dL 0.4 0.5     Recent Labs  07/23/14 1205 07/24/14 0126 07/25/14 0308  WBC 18.4* 18.3* 12.8*  HGB 14.3 13.1 12.6  HCT 43.1  40.7 38.5  MCV 79.4 79.8 78.9  PLT 301 320 253     Recent Labs  07/23/14 1645 07/23/14 1940 07/24/14 0126  TROPONINI 2.78* 3.32* 3.06*    Lab Results  Component Value Date   TSH 3.540 07/23/2014    Recent Labs  07/23/14 1645  HGBA1C 11.9*     Recent Labs  07/23/14 1205  PROT 7.8  ALBUMIN 3.3*  AST 29  ALT 14  ALKPHOS 90  BILITOT 0.4    Recent Labs  07/23/14 1205  INR 1.11   BNP (last 3 results) No results for input(s): BNP in the last 8760 hours.  ProBNP (last 3 results) No results for input(s): PROBNP in the last 8760 hours.   Lipid Panel     Component Value Date/Time   CHOL 210* 07/24/2014 0126   TRIG 327* 07/24/2014 0126   HDL 20* 07/24/2014 0126   CHOLHDL 10.5 07/24/2014 0126   VLDL 65* 07/24/2014 0126   LDLCALC 125* 07/24/2014 0126     Imaging:  No results found.  EMERGENT CARDIAC CATHETERIZATION:  07/23/14 Conclusion     Prox RCA to Mid RCA lesion, 75% stenosed.  The left ventricular systolic function is normal.  Mid LAD lesion, 30% stenosed.  1. 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 There is TIMI 3 flow.  2. Normal LV function.  Recommendations: I would recommend aggressive antiplatelet and antithrombotic therapy with ASA, Brilinta, IV heparin and IV tirofiban. I would stop tirofiban after 48 hours. Consider repeat angiography in 3-4 days to reassess lesion severity. Patient needs aggressive risk factor modification.      Assessment/Plan:   Principal Problem:   STEMI (ST elevation myocardial infarction) Active Problems:   Interstitial lung disease   Diabetes mellitus without complication   Hypertension   Morbid obesity   Non compliance w medication regimen   Depression   Smoker   Family history of coronary artery disease in father   ST elevation myocardial infarction (STEMI) involving right coronary artery with complication  1. Day 1 s/p STEMI secondary to long thrombotic lesion in the proximal RCA. P3eak troponin 3.32 2. HTN: 3. Hyperlipidemia: 4. Poorly controlled DM:  HbA1c  11.9 5. Morbid Obesity 6. Tobacco Abuse  Still with very mild residual chest tightness. Will increase iv NTG; continue heparin until relook angio early next week. Increase Metoprolol to 25 mg every 8 hrs today and titrate tomorrow to 50 mg bid if BP, HR allow.  Will add amlodipine with increased BP.  Continue atorvastatin  80 mg; will add fenofibrate to statin with inc TG and VLDL, and very low HDL with atherogenic dyslipidemic lipid panel.  Troy Sine, MD, [facility] 07/25/2014, 12:24 PM   Patient seen by Dr. Claiborne Billings. Note ended up in my box incorrectly for signature.   Seirra Kos,MD 11:46 AM

## 2014-07-25 NOTE — Progress Notes (Signed)
Patient ID: Joanna [name], female   DOB: 1970/05/27, 44 y.o.   MRN: [id number]    Patient Name: Joanna [name] Date of Encounter: 07/25/2014     Principal Problem:   STEMI (ST elevation myocardial infarction) Active Problems:   Interstitial lung disease   Diabetes mellitus without complication   Hypertension   Morbid obesity   Non compliance w medication regimen   Depression   Smoker   Family history of coronary artery disease in father   ST elevation myocardial infarction (STEMI) involving right coronary artery with complication    SUBJECTIVE  No chest pain or sob.  CURRENT MEDS . amLODipine  5 mg Oral Daily  . aspirin EC  81 mg Oral Daily  . atorvastatin  80 mg Oral q1800  . Chlorhexidine Gluconate Cloth  6 each Topical Q0600  . citalopram  20 mg Oral Daily  . insulin aspart  0-15 Units Subcutaneous TID WC  . insulin aspart  0-5 Units Subcutaneous QHS  . lisinopril  20 mg Oral Daily  . metoprolol tartrate  25 mg Oral Q8H  . mupirocin ointment  1 application Nasal BID  . pantoprazole  40 mg Oral Q0600  . potassium chloride  40 mEq Oral Once  . sodium chloride  3 mL Intravenous Q12H  . sodium chloride  3 mL Intravenous Q12H  . ticagrelor  90 mg Oral BID    OBJECTIVE  Filed Vitals:   07/25/14 0600 07/25/14 0700 07/25/14 1029 07/25/14 1206  BP: 113/86 109/56 117/80 101/61  Pulse: 64     Temp:  98.3 F (36.8 C)  98.5 F (36.9 C)  TempSrc:  Oral  Oral  Resp: 24 18  18   Height:      Weight:      SpO2: 96% 97%  97%    Intake/Output Summary (Last 24 hours) at 07/25/14 1249 Last data filed at 07/25/14 1000  Gross per 24 hour  Intake   2314 ml  Output      2 ml  Net   2312 ml   Filed Weights   07/23/14 1545 07/24/14 0346 07/25/14 0500  Weight: 313 lb 15 oz (142.4 kg) 313 lb 11.4 oz (142.3 kg) 315 lb (142.883 kg)    PHYSICAL EXAM  General: Pleasant, obese, hirsuteNAD. Neuro: Alert and oriented X 3. Moves all extremities spontaneously. Psych: Normal  affect. HEENT:  Normal except for marked hirsutism  Neck: Supple without bruits or JVD. Lungs:  Resp regular and unlabored, CTA. Heart: RRR no s3, s4, or murmurs. Abdomen: Soft, non-tender, non-distended, BS + x 4.  Extremities: No clubbing, cyanosis or edema. DP/PT/Radials 2+ and equal bilaterally.  Accessory Clinical Findings  CBC  Recent Labs  07/24/14 0126 07/25/14 0308  WBC 18.3* 12.8*  HGB 13.1 12.6  HCT 40.7 38.5  MCV 79.8 78.9  PLT 320 638   Basic Metabolic Panel  Recent Labs  07/23/14 1205 07/24/14 0126  NA 133* 134*  K 3.5 3.8  CL 96* 99*  CO2 26 27  GLUCOSE 331* 192*  BUN 12 8  CREATININE 0.73 0.72  CALCIUM 8.6* 8.3*   Liver Function Tests  Recent Labs  07/23/14 1205  AST 29  ALT 14  ALKPHOS 90  BILITOT 0.4  PROT 7.8  ALBUMIN 3.3*   No results for input(s): LIPASE, AMYLASE in the last 72 hours. Cardiac Enzymes  Recent Labs  07/23/14 1645 07/23/14 1940 07/24/14 0126  TROPONINI 2.78* 3.32* 3.06*   BNP Invalid input(s): POCBNP D-Dimer No  results for input(s): DDIMER in the last 72 hours. Hemoglobin A1C  Recent Labs  07/23/14 1645  HGBA1C 11.9*   Fasting Lipid Panel  Recent Labs  07/24/14 0126  CHOL 210*  HDL 20*  LDLCALC 125*  TRIG 327*  CHOLHDL 10.5   Thyroid Function Tests  Recent Labs  07/23/14 1645  TSH 3.540    TELE  nsr  Radiology/Studies  Dg Chest Port 1 View  07/23/2014   CLINICAL DATA:  One day history of chest pain  EXAM: PORTABLE CHEST - 1 VIEW  COMPARISON:  March 02, 2014  FINDINGS: Interstitium is somewhat prominent but stable. There are scattered small calcified granulomas in each upper lobe, stable. There is no frank edema or consolidation. Heart is upper normal in size with pulmonary vascularity within normal limits. No adenopathy.  IMPRESSION: Stable interstitial prominence, consistent with a degree of chronic interstitial pneumonitis of uncertain etiology. There are scattered tiny granulomas  bilaterally. There is no frank edema or consolidation. No change in cardiac silhouette allowing for change in technique and positioning.   Electronically Signed   By: Lowella Grip III M.D.   On: 07/23/2014 12:27    ASSESSMENT AND PLAN  1. STEMI 2. S/p cath 3. HTN 4. Obesity Rec: as per Dr. Leda Gauze, will keep IV heparin going today and stop tomorrow. She will need aggressive lifestyle modification. Note plans for relook PCI next week. Continue other meds.   Gregg Taylor,M.D.  07/25/2014 12:49 PM

## 2014-07-25 NOTE — Progress Notes (Signed)
CARDIAC REHAB PHASE I   PRE:  Rate/Rhythm: 68 SR  BP:  Supine:   Sitting: 115/65  Standing:    SaO2:   MODE:  Ambulation: 170 ft   POST:  Rate/Rhythm: 80 SR  BP:  Supine:   Sitting: 129/63  Standing:    SaO2:  1320-1356 Talked with pt's RN. OK to take short walk. Pt walked 170 ft on RA with IV pole with slow pace. Denied CP but was a little SOB after walk. 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 To recliner with call bell. Left MI booklet and discussed importance of brilinta. Left heart healthy and diabetic diets. Pt stated she lives with her sister and eats what is bought. Will continue to follow and ed.   Graylon Good, RN BSN  07/25/2014 1:52 PM

## 2014-07-25 NOTE — Progress Notes (Signed)
[place] for Heparin Indication: STEMI  Allergies  Allergen Reactions  . Wellbutrin [Bupropion] Rash    Patient Measurements: Height: 5\' 6"  (167.6 cm) Weight: (!) 315 lb (142.883 kg) IBW/kg (Calculated) : 59.3 Heparin Dosing Weight: 94.5 kg  Vital Signs: Temp: 98.7 F (37.1 C) (06/18 1825) Temp Source: Oral (06/18 1825) BP: 105/48 mmHg (06/18 1825)  Labs:  Recent Labs  07/23/14 1205 07/23/14 1645 07/23/14 1940 07/24/14 0126  07/25/14 0308 07/25/14 1050 07/25/14 1836  HGB 14.3  --   --  13.1  --  12.6  --   --   HCT 43.1  --   --  40.7  --  38.5  --   --   PLT 301  --   --  320  --  253  --   --   APTT 26  --   --   --   --   --   --   --   LABPROT 14.5  --   --   --   --   --   --   --   INR 1.11  --   --   --   --   --   --   --   HEPARINUNFRC  --   --   --  <0.10*  < > 0.27* 0.24* 0.41  CREATININE 0.73  --   --  0.72  --   --   --   --   TROPONINI  --  2.78* 3.32* 3.06*  --   --   --   --   < > = values in this interval not displayed.  Estimated Creatinine Clearance: 130 mL/min (by C-G formula based on Cr of 0.72).  Assessment: 44 y/o F with h/o multiple medication problems + tobacco and ILD presents with CP. MD note says she has not been able to get her medications for the past several months. +STEMI. 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 Pt continues on tirofiban and heparin - plan for repeat angiography early next week to reassess lesion severity.  HL still subtherapeutic at 0.24 (goal 0.3-0.5) despite dose increase to 2250 units/hr. No issues with line or bleeding per RN.  CBC stable.   Repeat HL is therapeutic at 0.41 on heparin 2400 units/hr. No issues with infusion or bleeding noted.  Goal of Therapy:  Heparin level 0.3-0.5 units/ml Monitor platelets by anticoagulation protocol: Yes   Plan:  Continue Tirofiban 0.15 mcg/kg/min  Increase  heparin to 2400 units / hr Daily HL and CBC  Andrey Cota. Diona Foley, PharmD Clinical Pharmacist Pager [phone] 07/25/2014 7:41 PM

## 2014-07-25 NOTE — Progress Notes (Signed)
[place] for Heparin Indication: STEMI  Allergies  Allergen Reactions  . Wellbutrin [Bupropion] Rash    Patient Measurements: Height: 5\' 6"  (167.6 cm) Weight: (!) 315 lb (142.883 kg) IBW/kg (Calculated) : 59.3 Heparin Dosing Weight: 94.5 kg  Vital Signs: Temp: 98.5 F (36.9 C) (06/18 1206) Temp Source: Oral (06/18 1206) BP: 101/61 mmHg (06/18 1206) Pulse Rate: 64 (06/18 0600)  Labs:  Recent Labs  07/23/14 1205 07/23/14 1645 07/23/14 1940 07/24/14 0126  07/24/14 1804 07/25/14 0308 07/25/14 1050  HGB 14.3  --   --  13.1  --   --  12.6  --   HCT 43.1  --   --  40.7  --   --  38.5  --   PLT 301  --   --  320  --   --  253  --   APTT 26  --   --   --   --   --   --   --   LABPROT 14.5  --   --   --   --   --   --   --   INR 1.11  --   --   --   --   --   --   --   HEPARINUNFRC  --   --   --  <0.10*  < > <0.10* 0.27* 0.24*  CREATININE 0.73  --   --  0.72  --   --   --   --   TROPONINI  --  2.78* 3.32* 3.06*  --   --   --   --   < > = values in this interval not displayed.  Estimated Creatinine Clearance: 130 mL/min (by C-G formula based on Cr of 0.72).  Assessment: 44 y/o F with h/o multiple medication problems + tobacco and ILD presents with CP. MD note says she has not been able to get her medications for the past several months. +STEMI. 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 Pt continues on tirofiban and heparin - plan for repeat angiography early next week to reassess lesion severity.  HL still subtherapeutic at 0.24 (goal 0.3-0.5) despite dose increase to 2250 units/hr. No issues with line or bleeding per RN.  CBC stable.   Goal of Therapy:  Heparin level 0.3-0.5 units/ml Monitor platelets by anticoagulation protocol: Yes   Plan:  Continue Tirofiban 0.15 mcg/kg/min  Increase heparin to 2400 units / hr Will f/u 6 hr heparin level, then daily HL and  CBC   Megan E. Supple, Pharm.D Clinical Pharmacy Resident Pager: [phone] 07/25/2014 12:30 PM

## 2014-07-25 NOTE — Progress Notes (Signed)
[place] for Heparin Indication: STEMI  Allergies  Allergen Reactions  . Wellbutrin [Bupropion] Rash    Patient Measurements: Height: 5\' 6"  (167.6 cm) Weight: (!) 313 lb 11.4 oz (142.3 kg) IBW/kg (Calculated) : 59.3 Heparin Dosing Weight: 94.5 kg  Vital Signs: Temp: 98.7 F (37.1 C) (06/18 0011) Temp Source: Oral (06/18 0011) BP: 101/36 mmHg (06/17 2200) Pulse Rate: 64 (06/17 2200)  Labs:  Recent Labs  07/23/14 1205 07/23/14 1645 07/23/14 1940  07/24/14 0126 07/24/14 1017 07/24/14 1804 07/25/14 0308  HGB 14.3  --   --   --  13.1  --   --  12.6  HCT 43.1  --   --   --  40.7  --   --  38.5  PLT 301  --   --   --  320  --   --  253  APTT 26  --   --   --   --   --   --   --   LABPROT 14.5  --   --   --   --   --   --   --   INR 1.11  --   --   --   --   --   --   --   HEPARINUNFRC  --   --   --   < > <0.10* <0.10* <0.10* 0.27*  CREATININE 0.73  --   --   --  0.72  --   --   --   TROPONINI  --  2.78* 3.32*  --  3.06*  --   --   --   < > = values in this interval not displayed.  Estimated Creatinine Clearance: 129.7 mL/min (by C-G formula based on Cr of 0.72).  Assessment: 44 y/o F with h/o multiple medication problems + tobacco and ILD presents with CP. MD note says she has not been able to get her medications for the past several months. +STEMI. 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 Pt continues on tirofiban and heparin - plan for repeat angiography early next week to reassess lesion severity.  HL 0.27 (slightly subtherapeutic) on 2100 units/hr. No issues with line or bleeding per RN.  CBC stable.   Goal of Therapy:  Heparin level 0.3-0.5 units/ml Monitor platelets by anticoagulation protocol: Yes   Plan:  Continue Tirofiban 0.15 mcg/kg/min  Increase heparin to 2250 units / hr Will f/u 6 hr heparin level  Sherlon Handing, PharmD, BCPS Clinical  pharmacist, pager [phone] 07/25/2014 4:27 AM

## 2014-07-26 LAB — CBC
HCT: 37.9 % (ref 36.0–46.0)
Hemoglobin: 12.2 g/dL (ref 12.0–15.0)
MCH: 25.4 pg — ABNORMAL LOW (ref 26.0–34.0)
MCHC: 32.2 g/dL (ref 30.0–36.0)
MCV: 78.8 fL (ref 78.0–100.0)
Platelets: 299 K/uL (ref 150–400)
RBC: 4.81 MIL/uL (ref 3.87–5.11)
RDW: 16.2 % — ABNORMAL HIGH (ref 11.5–15.5)
WBC: 13.6 K/uL — ABNORMAL HIGH (ref 4.0–10.5)

## 2014-07-26 LAB — GLUCOSE, CAPILLARY
Glucose-Capillary: 161 mg/dL — ABNORMAL HIGH (ref 65–99)
Glucose-Capillary: 196 mg/dL — ABNORMAL HIGH (ref 65–99)
Glucose-Capillary: 188 mg/dL — ABNORMAL HIGH (ref 65–99)
Glucose-Capillary: 176 mg/dL — ABNORMAL HIGH (ref 65–99)

## 2014-07-26 LAB — HEPARIN LEVEL (UNFRACTIONATED): Heparin Unfractionated: 0.48 [IU]/mL (ref 0.30–0.70)

## 2014-07-26 MED ORDER — METOPROLOL TARTRATE 50 MG PO TABS
50.0000 mg | ORAL_TABLET | Freq: Two times a day (BID) | ORAL | Status: DC
  Administered 2014-07-26 – 2014-07-28 (×3): 50 mg via ORAL
  Filled 2014-07-26 (×6): qty 1

## 2014-07-26 NOTE — Progress Notes (Signed)
[place] for Heparin Indication: STEMI  Allergies  Allergen Reactions  . Wellbutrin [Bupropion] Rash    Patient Measurements: Height: 5\' 6"  (167.6 cm) Weight: (!) 315 lb 11.2 oz (143.2 kg) IBW/kg (Calculated) : 59.3 Heparin Dosing Weight: 94.5 kg  Vital Signs: Temp: 98 F (36.7 C) (06/19 0349) Temp Source: Oral (06/19 0349) BP: 127/43 mmHg (06/19 0349) Pulse Rate: 46 (06/19 0200)  Labs:  Recent Labs  07/23/14 1205 07/23/14 1645 07/23/14 1940 07/24/14 0126  07/25/14 0308 07/25/14 1050 07/25/14 1836 07/26/14 0253  HGB 14.3  --   --  13.1  --  12.6  --   --  12.2  HCT 43.1  --   --  40.7  --  38.5  --   --  37.9  PLT 301  --   --  320  --  253  --   --  299  APTT 26  --   --   --   --   --   --   --   --   LABPROT 14.5  --   --   --   --   --   --   --   --   INR 1.11  --   --   --   --   --   --   --   --   HEPARINUNFRC  --   --   --  <0.10*  < > 0.27* 0.24* 0.41 0.48  CREATININE 0.73  --   --  0.72  --   --   --   --   --   TROPONINI  --  2.78* 3.32* 3.06*  --   --   --   --   --   < > = values in this interval not displayed.  Estimated Creatinine Clearance: 130.2 mL/min (by C-G formula based on Cr of 0.72).  Assessment: 44 y/o F with h/o multiple medication problems + tobacco and ILD presents with CP. MD note says she has not been able to get her medications for the past several months. +STEMI. 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 Pt continues on tirofiban and heparin - plan for repeat angiography early next week to reassess lesion severity.  HL therapeutic x2 at 2400 units/hr, most recent HL 0.48 (goal 0.3-0.5). No issues with line or bleeding per RN.  CBC stable.    Goal of Therapy:  Heparin level 0.3-0.5 units/ml Monitor platelets by anticoagulation protocol: Yes    Plan:  Continue Tirofiban 0.15 mcg/kg/min  Continue heparin 2400 units /  hr Daily HL and CBC   Megan E. Supple, Pharm.D Clinical Pharmacy Resident Pager: [phone] 07/26/2014 7:15 AM

## 2014-07-26 NOTE — Progress Notes (Signed)
Patient ID: Joanna [name], female   DOB: 1970-08-16, 44 y.o.   MRN: [id number]    Patient Name: Joanna [name] Date of Encounter: 07/26/2014     Principal Problem:   STEMI (ST elevation myocardial infarction) Active Problems:   Interstitial lung disease   Diabetes mellitus without complication   Hypertension   Morbid obesity   Non compliance w medication regimen   Depression   Smoker   Family history of coronary artery disease in father   ST elevation myocardial infarction (STEMI) involving right coronary artery with complication    SUBJECTIVE  No additional chest pain. Minimal sob with exertion  CURRENT MEDS . amLODipine  5 mg Oral Daily  . aspirin EC  81 mg Oral Daily  . atorvastatin  80 mg Oral q1800  . Chlorhexidine Gluconate Cloth  6 each Topical Q0600  . citalopram  20 mg Oral Daily  . insulin aspart  0-15 Units Subcutaneous TID WC  . insulin aspart  0-5 Units Subcutaneous QHS  . lisinopril  20 mg Oral Daily  . metoprolol tartrate  50 mg Oral BID  . mupirocin ointment  1 application Nasal BID  . pantoprazole  40 mg Oral Q0600  . potassium chloride  40 mEq Oral Once  . sodium chloride  3 mL Intravenous Q12H  . sodium chloride  3 mL Intravenous Q12H  . ticagrelor  90 mg Oral BID    OBJECTIVE  Filed Vitals:   07/26/14 0200 07/26/14 0349 07/26/14 0500 07/26/14 0755  BP: 113/62 127/43  144/74  Pulse: 46     Temp:  98 F (36.7 C)  97.6 F (36.4 C)  TempSrc:  Oral  Oral  Resp:  18  18  Height:      Weight:   315 lb 11.2 oz (143.2 kg)   SpO2:  97%  98%    Intake/Output Summary (Last 24 hours) at 07/26/14 0809 Last data filed at 07/25/14 2300  Gross per 24 hour  Intake 1262.5 ml  Output      0 ml  Net 1262.5 ml   Filed Weights   07/24/14 0346 07/25/14 0500 07/26/14 0500  Weight: 313 lb 11.4 oz (142.3 kg) 315 lb (142.883 kg) 315 lb 11.2 oz (143.2 kg)    PHYSICAL EXAM  General: Pleasant, NAD. Neuro: Alert and oriented X 3. Moves all extremities  spontaneously. Psych: Normal affect. HEENT:  Normal except for marked hirsutism  Neck: Supple without bruits or JVD. Lungs:  Resp regular and unlabored, CTA. Heart: RRR no s3, s4, or murmurs. Abdomen: Soft, non-tender, non-distended, BS + x 4.  Extremities: No clubbing, cyanosis or edema. DP/PT/Radials 2+ and equal bilaterally.  Accessory Clinical Findings  CBC  Recent Labs  07/25/14 0308 07/26/14 0253  WBC 12.8* 13.6*  HGB 12.6 12.2  HCT 38.5 37.9  MCV 78.9 78.8  PLT 253 151   Basic Metabolic Panel  Recent Labs  07/23/14 1205 07/24/14 0126  NA 133* 134*  K 3.5 3.8  CL 96* 99*  CO2 26 27  GLUCOSE 331* 192*  BUN 12 8  CREATININE 0.73 0.72  CALCIUM 8.6* 8.3*   Liver Function Tests  Recent Labs  07/23/14 1205  AST 29  ALT 14  ALKPHOS 90  BILITOT 0.4  PROT 7.8  ALBUMIN 3.3*   No results for input(s): LIPASE, AMYLASE in the last 72 hours. Cardiac Enzymes  Recent Labs  07/23/14 1645 07/23/14 1940 07/24/14 0126  TROPONINI 2.78* 3.32* 3.06*   BNP Invalid input(s):  POCBNP D-Dimer No results for input(s): DDIMER in the last 72 hours. Hemoglobin A1C  Recent Labs  07/23/14 1645  HGBA1C 11.9*   Fasting Lipid Panel  Recent Labs  07/24/14 0126  CHOL 210*  HDL 20*  LDLCALC 125*  TRIG 327*  CHOLHDL 10.5   Thyroid Function Tests  Recent Labs  07/23/14 1645  TSH 3.540    TELE nsr  Radiology/Studies  Dg Chest Port 1 View  07/23/2014   CLINICAL DATA:  One day history of chest pain  EXAM: PORTABLE CHEST - 1 VIEW  COMPARISON:  March 02, 2014  FINDINGS: Interstitium is somewhat prominent but stable. There are scattered small calcified granulomas in each upper lobe, stable. There is no frank edema or consolidation. Heart is upper normal in size with pulmonary vascularity within normal limits. No adenopathy.  IMPRESSION: Stable interstitial prominence, consistent with a degree of chronic interstitial pneumonitis of uncertain etiology. There are  scattered tiny granulomas bilaterally. There is no frank edema or consolidation. No change in cardiac silhouette allowing for change in technique and positioning.   Electronically Signed   By: Lowella Grip III M.D.   On: 07/23/2014 12:27    ASSESSMENT AND PLAN  1. STEMI 2. S/p cath with 75% RCA with no intervention, on 48 hours of IV blood thinners 3. Obesity 4. Hirsutism Rec: As per Dr. Evette Georges plan, for re-look angio tomorrow. Will continue IV meds for now. Orders written.   Gregg Taylor,M.D.  07/26/2014 8:09 AM

## 2014-07-27 ENCOUNTER — Encounter (HOSPITAL_COMMUNITY)
Admission: EM | Disposition: A | Discharge status: Home / Self Care | Payer: Self-pay | Source: Home / Self Care | Attending: Cardiology

## 2014-07-27 ENCOUNTER — Encounter (HOSPITAL_COMMUNITY): Payer: Self-pay | Admitting: Interventional Cardiology

## 2014-07-27 DIAGNOSIS — I2510 Atherosclerotic heart disease of native coronary artery without angina pectoris: Secondary | ICD-10-CM

## 2014-07-27 HISTORY — PX: CARDIAC CATHETERIZATION: SHX172

## 2014-07-27 LAB — BASIC METABOLIC PANEL
Anion gap: 8 (ref 5–15)
BUN: 6 mg/dL (ref 6–20)
CO2: 25 mmol/L (ref 22–32)
Calcium: 8.8 mg/dL — ABNORMAL LOW (ref 8.9–10.3)
Chloride: 103 mmol/L (ref 101–111)
Creatinine, Ser: 0.6 mg/dL (ref 0.44–1.00)
GFR calc non Af Amer: >60 mL/min (ref >60)
GLUCOSE: 148 mg/dL — AB (ref 65–99)
Potassium: 3.7 mmol/L (ref 3.5–5.1)
Sodium: 136 mmol/L (ref 135–145)

## 2014-07-27 LAB — CBC
HCT: 38.7 % (ref 36.0–46.0)
Hemoglobin: 12.3 g/dL (ref 12.0–15.0)
MCH: 25.7 pg — AB (ref 26.0–34.0)
MCHC: 31.8 g/dL (ref 30.0–36.0)
MCV: 80.8 fL (ref 78.0–100.0)
PLATELETS: 273 10*3/uL (ref 150–400)
RBC: 4.79 MIL/uL (ref 3.87–5.11)
RDW: 16.3 % — ABNORMAL HIGH (ref 11.5–15.5)
WBC: 15 10*3/uL — ABNORMAL HIGH (ref 4.0–10.5)

## 2014-07-27 LAB — GLUCOSE, CAPILLARY
GLUCOSE-CAPILLARY: 201 mg/dL — AB (ref 65–99)
GLUCOSE-CAPILLARY: 190 mg/dL — AB (ref 65–99)
Glucose-Capillary: 137 mg/dL — ABNORMAL HIGH (ref 65–99)

## 2014-07-27 LAB — HEPARIN LEVEL (UNFRACTIONATED)
Heparin Unfractionated: 0.29 IU/mL — ABNORMAL LOW (ref 0.30–0.70)
Heparin Unfractionated: 0.15 IU/mL — ABNORMAL LOW (ref 0.30–0.70)

## 2014-07-27 SURGERY — LEFT HEART CATH AND CORONARY ANGIOGRAPHY
Anesthesia: LOCAL

## 2014-07-27 MED ORDER — HEPARIN SODIUM (PORCINE) 1000 UNIT/ML IJ SOLN
INTRAMUSCULAR | Status: DC | PRN
Start: 2014-07-27 — End: 2014-07-27
  Administered 2014-07-27: 9000 [IU] via INTRAVENOUS

## 2014-07-27 MED ORDER — TICAGRELOR 90 MG PO TABS
ORAL_TABLET | ORAL | Status: DC | PRN
  Administered 2014-07-27: 180 mg via ORAL

## 2014-07-27 MED ORDER — ACETAMINOPHEN 325 MG PO TABS: 650.0000 mg | ORAL_TABLET | ORAL | Status: DC | PRN

## 2014-07-27 MED ORDER — MIDAZOLAM HCL 2 MG/2ML IJ SOLN
INTRAMUSCULAR | Status: AC
  Filled 2014-07-27: qty 2

## 2014-07-27 MED ORDER — VERAPAMIL HCL 2.5 MG/ML IV SOLN
INTRAVENOUS | Status: DC | PRN
  Administered 2014-07-27: 11:00:00 via INTRA_ARTERIAL

## 2014-07-27 MED ORDER — IOHEXOL 350 MG/ML SOLN
INTRAVENOUS | Status: DC | PRN
  Administered 2014-07-27: 50 mL via INTRA_ARTERIAL

## 2014-07-27 MED ORDER — TIROFIBAN HCL IV 12.5 MG/250 ML: 0.1500 ug/kg/min | INTRAVENOUS | Status: AC

## 2014-07-27 MED ORDER — SODIUM CHLORIDE 0.9 % WEIGHT BASED INFUSION
1.0000 mL/kg/h | INTRAVENOUS | Status: DC
Start: 2014-07-27 — End: 2014-07-27

## 2014-07-27 MED ORDER — TICAGRELOR 90 MG PO TABS
90.0000 mg | ORAL_TABLET | Freq: Two times a day (BID) | ORAL | Status: DC
  Administered 2014-07-27 – 2014-07-28 (×2): 90 mg via ORAL
  Filled 2014-07-27 (×3): qty 1

## 2014-07-27 MED ORDER — NITROGLYCERIN 1 MG/10 ML FOR IR/CATH LAB
INTRA_ARTERIAL | Status: DC | PRN
  Administered 2014-07-27: 200 ug via INTRACORONARY

## 2014-07-27 MED ORDER — NITROGLYCERIN 1 MG/10 ML FOR IR/CATH LAB
INTRA_ARTERIAL | Status: AC
  Filled 2014-07-27: qty 10

## 2014-07-27 MED ORDER — ONDANSETRON HCL 4 MG/2ML IJ SOLN: 4.0000 mg | Freq: Four times a day (QID) | INTRAMUSCULAR | Status: DC | PRN

## 2014-07-27 MED ORDER — TICAGRELOR 90 MG PO TABS
ORAL_TABLET | ORAL | Status: AC
  Filled 2014-07-27: qty 1

## 2014-07-27 MED ORDER — MIDAZOLAM HCL 2 MG/2ML IJ SOLN
INTRAMUSCULAR | Status: DC | PRN
  Administered 2014-07-27: 2 mg via INTRAVENOUS
  Administered 2014-07-27: 1 mg via INTRAVENOUS

## 2014-07-27 MED ORDER — FENTANYL CITRATE (PF) 100 MCG/2ML IJ SOLN
INTRAMUSCULAR | Status: AC
  Filled 2014-07-27: qty 2

## 2014-07-27 MED ORDER — SODIUM CHLORIDE 0.9 % IJ SOLN: 3.0000 mL | INTRAMUSCULAR | Status: DC | PRN

## 2014-07-27 MED ORDER — ASPIRIN 81 MG PO CHEW
81.0000 mg | CHEWABLE_TABLET | Freq: Every day | ORAL | Status: DC
  Administered 2014-07-28: 81 mg via ORAL
  Filled 2014-07-27: qty 1

## 2014-07-27 MED ORDER — LIDOCAINE HCL (PF) 1 % IJ SOLN
INTRAMUSCULAR | Status: AC
  Filled 2014-07-27: qty 30

## 2014-07-27 MED ORDER — SODIUM CHLORIDE 0.9 % IV SOLN: 250.0000 mL | INTRAVENOUS | Status: DC | PRN

## 2014-07-27 MED ORDER — SODIUM CHLORIDE 0.9 % WEIGHT BASED INFUSION: 1.0000 mL/kg/h | INTRAVENOUS | Status: AC

## 2014-07-27 MED ORDER — HEPARIN (PORCINE) IN NACL 2-0.9 UNIT/ML-% IJ SOLN
INTRAMUSCULAR | Status: AC
  Filled 2014-07-27: qty 1000

## 2014-07-27 MED ORDER — FENTANYL CITRATE (PF) 100 MCG/2ML IJ SOLN
INTRAMUSCULAR | Status: DC | PRN
  Administered 2014-07-27 (×2): 25 ug via INTRAVENOUS

## 2014-07-27 MED ORDER — HEPARIN SODIUM (PORCINE) 1000 UNIT/ML IJ SOLN
INTRAMUSCULAR | Status: AC
  Filled 2014-07-27: qty 1

## 2014-07-27 MED ORDER — ASPIRIN 81 MG PO CHEW: 81.0000 mg | CHEWABLE_TABLET | ORAL | Status: DC

## 2014-07-27 MED ORDER — VERAPAMIL HCL 2.5 MG/ML IV SOLN
INTRAVENOUS | Status: AC
  Filled 2014-07-27: qty 2

## 2014-07-27 MED ORDER — SODIUM CHLORIDE 0.9 % IJ SOLN
3.0000 mL | Freq: Two times a day (BID) | INTRAMUSCULAR | Status: DC
  Administered 2014-07-27 – 2014-07-28 (×2): 3 mL via INTRAVENOUS

## 2014-07-27 MED ORDER — TICAGRELOR 90 MG PO TABS: 90.0000 mg | ORAL_TABLET | Freq: Two times a day (BID) | ORAL | Status: DC

## 2014-07-27 MED ORDER — SODIUM CHLORIDE 0.9 % IV SOLN
250.0000 mL | INTRAVENOUS | Status: DC | PRN
Start: 2014-07-27 — End: 2014-07-27

## 2014-07-27 MED ORDER — SODIUM CHLORIDE 0.9 % WEIGHT BASED INFUSION: 3.0000 mL/kg/h | INTRAVENOUS | Status: DC

## 2014-07-27 MED ORDER — SODIUM CHLORIDE 0.9 % IJ SOLN
3.0000 mL | Freq: Two times a day (BID) | INTRAMUSCULAR | Status: DC
  Administered 2014-07-27: 3 mL via INTRAVENOUS

## 2014-07-27 SURGICAL SUPPLY — 16 items
CATH EXTRAC PRONTO 5.5F 138CM (CATHETERS) ×2 IMPLANT
CATH INFINITI JR4 5F (CATHETERS) ×2 IMPLANT
CATH VISTA GUIDE 6FR JR4 (CATHETERS) ×2 IMPLANT
DEVICE RAD COMP TR BAND LRG (VASCULAR PRODUCTS) ×2 IMPLANT
GLIDESHEATH SLEND A-KIT 6F 22G (SHEATH) IMPLANT
GLIDESHEATH SLEND SS 6F .021 (SHEATH) ×2 IMPLANT
KIT HEART LEFT (KITS) ×2 IMPLANT
PACK EP LATEX FREE (CUSTOM PROCEDURE TRAY) ×1
PACK EP LF (CUSTOM PROCEDURE TRAY) ×1 IMPLANT
SHEATH PINNACLE 5F 10CM (SHEATH) IMPLANT
TRANSDUCER W/STOPCOCK (MISCELLANEOUS) ×2 IMPLANT
TUBING CIL FLEX 10 FLL-RA (TUBING) ×2 IMPLANT
VALVE GUARDIAN II ~~LOC~~ HEMO (MISCELLANEOUS) ×2 IMPLANT
WIRE ASAHI PROWATER 180CM (WIRE) ×2 IMPLANT
WIRE EMERALD 3MM-J .035X150CM (WIRE) IMPLANT
WIRE SAFE-T 1.5MM-J .035X260CM (WIRE) ×2 IMPLANT

## 2014-07-27 NOTE — H&P (View-Only) (Signed)
       Patient Name: Joanna [name] Date of Encounter: 07/27/2014    SUBJECTIVE: No chest pain over the weekend. Denies dyspnea.  TELEMETRY:  Normal sinus rhythm Filed Vitals:   07/27/14 0050 07/27/14 0417 07/27/14 0805 07/27/14 0820  BP: 115/51 121/55  125/65  Pulse: 58 64    Temp: 98.2 F (36.8 C) 98 F (36.7 C)  98.1 F (36.7 C)  TempSrc: Oral Oral  Oral  Resp: 20 19    Height:      Weight:   141.7 kg (312 lb 6.3 oz)   SpO2: 98% 98%  95%    Intake/Output Summary (Last 24 hours) at 07/27/14 1053 Last data filed at 07/27/14 0800  Gross per 24 hour  Intake 1507.73 ml  Output    600 ml  Net 907.73 ml   LABS: Basic Metabolic Panel: No results for input(s): NA, K, CL, CO2, GLUCOSE, BUN, CREATININE, CALCIUM, MG, PHOS in the last 72 hours. CBC:  Recent Labs  07/26/14 0253 07/27/14 0315  WBC 13.6* 15.0*  HGB 12.2 12.3  HCT 37.9 38.7  MCV 78.8 80.8  PLT 299 273    Radiology/Studies:  No new data  Physical Exam: Blood pressure 125/65, pulse 64, temperature 98.1 F (36.7 C), temperature source Oral, resp. rate 19, height 5\' 6"  (1.676 m), weight 141.7 kg (312 lb 6.3 oz), SpO2 95 %. Weight change:   Wt Readings from Last 3 Encounters:  07/27/14 141.7 kg (312 lb 6.3 oz)  03/02/14 159.666 kg (352 lb)    Hirsutism Obese Clear lungs No rub or gallop 1+ right radial pulse No peripheral edema  ASSESSMENT:  1. Inferior ST elevation myocardial infarction with spontaneous recanalization by the time of coronary angina. Has had no recurrence of discomfort since admission. Thrombus filled lesion treated with Aggrastat and anti-platelet therapy says angiography on 07/23/2014 2. Multiple risk factors including diabetes mellitus with complications, hypertension, morbid obesity, and tobacco use. 3. Interstitial lung disease  Plan:  1. For right coronary relook today. 2. Intervention if necessary otherwise progress activity with discharge in a.m. 3. We'll be able to  transfer out of the unit to a telemetry bed after cath unless PCI complications.  Demetrios Isaacs 07/27/2014, 10:53 AM

## 2014-07-27 NOTE — Progress Notes (Signed)
       Patient Name: Joanna [name] Date of Encounter: 07/27/2014    SUBJECTIVE: No chest pain over the weekend. Denies dyspnea.  TELEMETRY:  Normal sinus rhythm Filed Vitals:   07/27/14 0050 07/27/14 0417 07/27/14 0805 07/27/14 0820  BP: 115/51 121/55  125/65  Pulse: 58 64    Temp: 98.2 F (36.8 C) 98 F (36.7 C)  98.1 F (36.7 C)  TempSrc: Oral Oral  Oral  Resp: 20 19    Height:      Weight:   141.7 kg (312 lb 6.3 oz)   SpO2: 98% 98%  95%    Intake/Output Summary (Last 24 hours) at 07/27/14 1053 Last data filed at 07/27/14 0800  Gross per 24 hour  Intake 1507.73 ml  Output    600 ml  Net 907.73 ml   LABS: Basic Metabolic Panel: No results for input(s): NA, K, CL, CO2, GLUCOSE, BUN, CREATININE, CALCIUM, MG, PHOS in the last 72 hours. CBC:  Recent Labs  07/26/14 0253 07/27/14 0315  WBC 13.6* 15.0*  HGB 12.2 12.3  HCT 37.9 38.7  MCV 78.8 80.8  PLT 299 273    Radiology/Studies:  No new data  Physical Exam: Blood pressure 125/65, pulse 64, temperature 98.1 F (36.7 C), temperature source Oral, resp. rate 19, height 5\' 6"  (1.676 m), weight 141.7 kg (312 lb 6.3 oz), SpO2 95 %. Weight change:   Wt Readings from Last 3 Encounters:  07/27/14 141.7 kg (312 lb 6.3 oz)  03/02/14 159.666 kg (352 lb)    Hirsutism Obese Clear lungs No rub or gallop 1+ right radial pulse No peripheral edema  ASSESSMENT:  1. Inferior ST elevation myocardial infarction with spontaneous recanalization by the time of coronary angina. Has had no recurrence of discomfort since admission. Thrombus filled lesion treated with Aggrastat and anti-platelet therapy says angiography on 07/23/2014 2. Multiple risk factors including diabetes mellitus with complications, hypertension, morbid obesity, and tobacco use. 3. Interstitial lung disease  Plan:  1. For right coronary relook today. 2. Intervention if necessary otherwise progress activity with discharge in a.m. 3. We'll be able to  transfer out of the unit to a telemetry bed after cath unless PCI complications.  Demetrios Isaacs 07/27/2014, 10:53 AM

## 2014-07-27 NOTE — Interval H&P Note (Signed)
Cath Lab Visit (complete for each Cath Lab visit)  Clinical Evaluation Leading to the Procedure:   ACS: Yes.    Non-ACS:    Anginal Classification: CCS IV  Anti-ischemic medical therapy: Minimal Therapy (1 class of medications)  Non-Invasive Test Results: No non-invasive testing performed  Prior CABG: No previous CABG  TIMI Score  Patient Information:  TIMI Score is 3   UA/NSTEMI and intermediate-risk features (e.g., TIMI score 3-4) for short-term risk of death or nonfatal MI  Revascularization of the presumed culprit artery   A (9)  Indication: 10; Score: 9     History and Physical Interval Note:  07/27/2014 11:05 AM  Joanna [name]  has presented today for surgery, with the diagnosis of CAD  The various methods of treatment have been discussed with the patient and family. After consideration of risks, benefits and other options for treatment, the patient has consented to  Procedure(s): Left Heart Cath and Coronary Angiography (N/A) as a surgical intervention .  The patient's history has been reviewed, patient examined, no change in status, stable for surgery.  I have reviewed the patient's chart and labs.  Questions were answered to the patient's satisfaction.     Max Romano S.

## 2014-07-27 NOTE — Care Management Note (Signed)
Case Management Note  Patient Details  Name: Joanna [name] MRN: [id number] Date of Birth: 11-07-1970  Subjective/Objective:      Adm w mi              Action/Plan: lives w fam   Expected Discharge Date:                  Expected Discharge Plan:  Home/Self Care  In-House Referral:     Discharge planning Services  CM Consult, Medication Assistance  Post Acute Care Choice:    Choice offered to:     DME Arranged:    DME Agency:     HH Arranged:    [place] Agency:     Status of Service:     Medicare Important Message Given:    Date Medicare IM Given:    Medicare IM give by:    Date Additional Medicare IM Given:    Additional Medicare Important Message give by:     If discussed at [place] of Stay Meetings, dates discussed:    Additional Comments:left brilinta 30day free and copay assist card. Placed pt assist form near shadow chart. No ins listed. [facility]. [place].  Lacretia Leigh, [place] 07/27/2014, 10:55 AM

## 2014-07-27 NOTE — Progress Notes (Addendum)
ANTICOAGULATION CONSULT NOTE - Follow Up Consult  Pharmacy Consult for heparin Indication: STEMI   Labs:  Recent Labs  07/25/14 0308  07/25/14 1836 07/26/14 0253 07/27/14 0315  HGB 12.6  --   --  12.2 12.3  HCT 38.5  --   --  37.9 38.7  PLT 253  --   --  299 273  HEPARINUNFRC 0.27*  < > 0.41 0.48 0.29*  < > = values in this interval not displayed.    Assessment: 44yo female now slightly subtherapeutic on heparin after two levels at goal.  Goal of Therapy:  Heparin level 0.3-0.5 units/ml   Plan:  Will increase heparin gtt slightly to 2500 units/hr and check level in [place], PharmD, BCPS  07/27/2014,4:16 AM

## 2014-07-28 ENCOUNTER — Encounter (HOSPITAL_COMMUNITY): Payer: Self-pay | Admitting: Physician Assistant

## 2014-07-28 DIAGNOSIS — Z720 Tobacco use: Secondary | ICD-10-CM

## 2014-07-28 DIAGNOSIS — D72829 Elevated white blood cell count, unspecified: Secondary | ICD-10-CM

## 2014-07-28 DIAGNOSIS — I2510 Atherosclerotic heart disease of native coronary artery without angina pectoris: Secondary | ICD-10-CM

## 2014-07-28 LAB — BASIC METABOLIC PANEL
Anion gap: 11 (ref 5–15)
BUN: 7 mg/dL (ref 6–20)
CALCIUM: 8.3 mg/dL — AB (ref 8.9–10.3)
CO2: 21 mmol/L — AB (ref 22–32)
CREATININE: 0.61 mg/dL (ref 0.44–1.00)
Chloride: 102 mmol/L (ref 101–111)
GFR calc non Af Amer: >60 mL/min (ref >60)
GLUCOSE: 141 mg/dL — AB (ref 65–99)
Potassium: 5.5 mmol/L — ABNORMAL HIGH (ref 3.5–5.1)
SODIUM: 134 mmol/L — AB (ref 135–145)

## 2014-07-28 LAB — GLUCOSE, CAPILLARY
Glucose-Capillary: 155 mg/dL — ABNORMAL HIGH (ref 65–99)
Glucose-Capillary: 149 mg/dL — ABNORMAL HIGH (ref 65–99)

## 2014-07-28 LAB — CBC
HCT: 40.1 % (ref 36.0–46.0)
HEMOGLOBIN: 12.9 g/dL (ref 12.0–15.0)
MCH: 25.7 pg — ABNORMAL LOW (ref 26.0–34.0)
MCHC: 32.2 g/dL (ref 30.0–36.0)
MCV: 80 fL (ref 78.0–100.0)
Platelets: 247 10*3/uL (ref 150–400)
RBC: 5.01 MIL/uL (ref 3.87–5.11)
RDW: 16.5 % — AB (ref 11.5–15.5)
WBC: 12.3 10*3/uL — ABNORMAL HIGH (ref 4.0–10.5)

## 2014-07-28 LAB — POTASSIUM: Potassium: 3.8 mmol/L (ref 3.5–5.1)

## 2014-07-28 LAB — POCT ACTIVATED CLOTTING TIME: Activated Clotting Time: 306 seconds

## 2014-07-28 MED ORDER — CITALOPRAM HYDROBROMIDE 20 MG PO TABS: 20.0000 mg | ORAL_TABLET | Freq: Every day | ORAL | Status: DC

## 2014-07-28 MED ORDER — LISINOPRIL 20 MG PO TABS: 20.0000 mg | ORAL_TABLET | Freq: Every day | ORAL | Status: DC

## 2014-07-28 MED ORDER — ASPIRIN EC 81 MG PO TBEC: 81.0000 mg | DELAYED_RELEASE_TABLET | Freq: Every day | ORAL | Status: DC

## 2014-07-28 MED ORDER — AMLODIPINE BESYLATE 5 MG PO TABS: 5.0000 mg | ORAL_TABLET | Freq: Every day | ORAL | Status: DC

## 2014-07-28 MED ORDER — NITROGLYCERIN 0.4 MG SL SUBL: 0.4000 mg | SUBLINGUAL_TABLET | SUBLINGUAL | Status: DC | PRN

## 2014-07-28 MED ORDER — PRAVASTATIN SODIUM 80 MG PO TABS: 80.0000 mg | ORAL_TABLET | Freq: Every evening | ORAL | Status: DC

## 2014-07-28 MED ORDER — LIVING WELL WITH DIABETES BOOK
Freq: Once | Status: AC
  Administered 2014-07-28: 10:00:00
  Filled 2014-07-28: qty 1

## 2014-07-28 MED ORDER — PRAVASTATIN SODIUM 80 MG PO TABS
80.0000 mg | ORAL_TABLET | Freq: Every day | ORAL | Status: DC
  Filled 2014-07-28: qty 1

## 2014-07-28 MED ORDER — METOPROLOL TARTRATE 50 MG PO TABS: 50.0000 mg | ORAL_TABLET | Freq: Two times a day (BID) | ORAL | Status: DC

## 2014-07-28 MED ORDER — TICAGRELOR 90 MG PO TABS: 90.0000 mg | ORAL_TABLET | Freq: Two times a day (BID) | ORAL | Status: DC

## 2014-07-28 MED ORDER — METFORMIN HCL 500 MG PO TABS: 500.0000 mg | ORAL_TABLET | Freq: Two times a day (BID) | ORAL | Status: DC

## 2014-07-28 MED ORDER — METFORMIN HCL 500 MG PO TABS
500.0000 mg | ORAL_TABLET | Freq: Two times a day (BID) | ORAL | Status: DC
  Filled 2014-07-28 (×2): qty 1

## 2014-07-28 MED FILL — Heparin Sodium (Porcine) 2 Unit/ML in Sodium Chloride 0.9%: INTRAMUSCULAR | Qty: 1000 | Status: AC

## 2014-07-28 MED FILL — Lidocaine HCl Local Preservative Free (PF) Inj 1%: INTRAMUSCULAR | Qty: 30 | Status: AC

## 2014-07-28 NOTE — Discharge Summary (Signed)
Discharge Summary   Patient ID: Joanna [name] MRN: [id number], DOB/AGE: 09-29-1970 44 y.o. Admit date: 07/23/2014 D/C date:     07/28/2014  Primary Care Provider: No primary care provider on file. Primary Cardiologist: Will be new to [place] - needs to be assigned at next follow-up visit. Seen by [facility] team this admission.  Primary Discharge Diagnoses:  1. Inferior STEMI/CAD s/p thrombectomy during relook cath 07/27/14 2. Diabetes mellitus uncontrolled 3. HTN 4. Morbid obesity Body mass index is 49.91 kg/(m^2). 5. Tobacco abuse 6. Interstitial lung disease 7. Leukocytosis, suspect due to MI but may need recheck 8. Depression 9. Hirsuitism  Hospital Course: Joanna [name] is a 44 y/o morbidly obese female with ILD, DM, HLD, HTN, FM Hx of CAD, and ongoing tobacco abuse.  She is from [facility] and is now living in [place] with her sister. She has not been able to get her medications for the past several months. The day prior to admission she began having chest pain, initially mid-sternal but then moving to the left chest, shoulder and axilla. She tried a breathing treatment at home with some improvement. However, symptoms persisted and gradually worsened the following morning thus she presented to the hospital. She presented to the ED in [place] and an EKG showed inferior STEMI. She was treated with NTG, ASA, and Heparin and transferred to [facility] cath lab for further evaluation. Emergent cath 07/23/14 showed long thrombotic lesion in the proximal to mid RCA with TIMI 3 flow, 75% stenosed. She was treated with aggressive antiplatelet and antithrombotic therapy with ASA, Brilinta, IV heparin and IV tirofiban. LVEF was normal at 55-65%. The plan was to consider repeat angiography 3-4 days to reassess lesion severity. She was observed over the weekend without acute complication. Peak troponin drawn was 3.06. Lipid panel was deranged with trig 327, HDL 20, LDL 125, and A1c terribly uncontrolled at  11.9. TSH wnl. She was started on statin. 2D Echo 07/24/14: EF 55-60%, no RWMA, mildly dilated LA, unable to eval LV diastolic function. She underwent planned relook cath on 07/27/14 showing Prox RCA to Mid RCA lesion, 90% stenosed. She had successful percutaneous thrombectomy with a Pronto catheter with very large debris removal. There is a 0% residual stenosis post intervention. Dr. Irish Lack said there may be some endothelial disruption from the ruptured plaque on 07/23/14. She will need aggressive risk factor modification. She did have elevated LVEDP at cath but did not require diuresis. Recommendation was to continue DAPT for 3 months. She was seen by Care management twice during this admission for assistance in referral to primary care resources. Brilinta paperwork was filled out and 30day free card given. She was given a handwritten 30-day free script for the Brilinta for month 1, then the usual printed Epic prescription for months 2 & 3 to total 3 months' worth. Would likely continue aspirin after Brilinta discontinuation - she would benefit from reinforcement of this at her follow-up appointment. She was not discharged on high potency statin due to the fact that she cannot afford it - given pravastatin instead. Today she is feeling well. She has been extensively educated on risk factor modification in particular by cardiac rehab, especially regarding importance of smoking cessation. Dr. Tamala Julian has seen and examined the patient today and feels he is stable for discharge. K+ will be rechecked prior to d/c given level of 5.5 and mention of hemolysis.  Consider repeat CBC at f/u appointment given persistent leukocytosis this admission. Will need arrangement of f/u lipids/LFTs in  6-8 weeks given statin initiation. Of note the patient was supposed to be on Celexa PTA. This was continued as inpatient. She does not have any more at home. We have given her a short-term RX of this and she should also follow up with  primary care for refills. Dr. Tamala Julian has cleared her to start metformin this evening given absence of kidney dysfunction. We have given her a short-term prescription for this as well. She has f/u with the [facility] on 6/27 so further refills should come from their clinic.  - - -    Vital Signs. BP 147/80 mmHg  Pulse 65  Temp(Src) 98.2 F (36.8 C) (Oral)  Resp 17  Ht 5\' 6"  (1.676 m)  Wt 309 lb 1.4 oz (140.2 kg)  BMI 49.91 kg/m2  SpO2 99% General: Well developed morbidly obese F in no acute distress Head: Normocephalic, atraumatic, sclera non-icteric, no xanthomas, nares are without discharge. Neck: Negative for carotid bruits. JVP not elevated. Lungs: Clear bilaterally to auscultation without wheezes, rales, or rhonchi. Breathing is unlabored. Heart: RRR S1 S2 without murmurs, rubs, or gallops.  Abdomen: Soft, non-tender, non-distended with normoactive bowel sounds. No rebound/guarding. Extremities: No clubbing or cyanosis. No edema. Distal pedal pulses are 2+ and equal bilaterally. Radial cath site OK. Neuro: Alert and oriented X 3. Moves all extremities spontaneously. Psych:  Responds to questions appropriately with a normal affect. Labs: Lab Results  Component Value Date   WBC 12.3* 07/28/2014   HGB 12.9 07/28/2014   HCT 40.1 07/28/2014   MCV 80.0 07/28/2014   PLT 247 07/28/2014    Recent Labs Lab 07/23/14 1205  07/28/14 0237  NA 133*  < > 134*  K 3.5  < > 5.5*  CL 96*  < > 102  CO2 26  < > 21*  BUN 12  < > 7  CREATININE 0.73  < > 0.61  CALCIUM 8.6*  < > 8.3*  PROT 7.8  --   --   BILITOT 0.4  --   --   ALKPHOS 90  --   --   ALT 14  --   --   AST 29  --   --   GLUCOSE 331*  < > 141*  < > = values in this interval not displayed.  Lab Results  Component Value Date   CHOL 210* 07/24/2014   HDL 20* 07/24/2014   LDLCALC 125* 07/24/2014   TRIG 327* 07/24/2014     Diagnostic Studies/Procedures   Dg Chest Port 1 View  07/23/2014    CLINICAL DATA:  One day history of chest pain  EXAM: PORTABLE CHEST - 1 VIEW  COMPARISON:  March 02, 2014  FINDINGS: Interstitium is somewhat prominent but stable. There are scattered small calcified granulomas in each upper lobe, stable. There is no frank edema or consolidation. Heart is upper normal in size with pulmonary vascularity within normal limits. No adenopathy.  IMPRESSION: Stable interstitial prominence, consistent with a degree of chronic interstitial pneumonitis of uncertain etiology. There are scattered tiny granulomas bilaterally. There is no frank edema or consolidation. No change in cardiac silhouette allowing for change in technique and positioning.   Electronically Signed   By: Lowella Grip III M.D.   On: 07/23/2014 12:27   Cardiac catheterizations this admission, please see full report and above for summary.  2D echo 07/24/14 - Left ventricle: The cavity size was normal. Systolic function was normal. The estimated ejection fraction was in the range  of 55% to 60%. Wall motion was normal; there were no regional wall motion abnormalities. The study is not technically sufficient to allow evaluation of LV diastolic function. - Left atrium: The atrium was mildly dilated.  Discharge Medications   Current Discharge Medication List    START taking these medications   Details  amLODipine (NORVASC) 5 MG tablet Take 1 tablet (5 mg total) by mouth daily. Qty: 30 tablet, Refills: 6    aspirin EC 81 MG tablet Take 1 tablet (81 mg total) by mouth daily. Qty: 30 tablet, Refills: 11    metFORMIN (GLUCOPHAGE) 500 MG tablet Take 1 tablet (500 mg total) by mouth 2 (two) times daily with a meal. Qty: 60 tablet, Refills: 0    metoprolol (LOPRESSOR) 50 MG tablet Take 1 tablet (50 mg total) by mouth 2 (two) times daily. Qty: 60 tablet, Refills: 6    nitroGLYCERIN (NITROSTAT) 0.4 MG SL tablet Place 1 tablet (0.4 mg total) under the tongue every 5 (five) minutes as needed for  chest pain (up to 3 doses). Qty: 25 tablet, Refills: 3    pravastatin (PRAVACHOL) 80 MG tablet Take 1 tablet (80 mg total) by mouth every evening. Qty: 30 tablet, Refills: 6    ticagrelor (BRILINTA) 90 MG TABS tablet Take 1 tablet (90 mg total) by mouth 2 (two) times daily. Qty: 60 tablet, Refills: 1 (+ handwritten RX given for 30 days free, totalling 3 months worth of prescriptions)      CONTINUE these medications which have CHANGED - note these have not changed, just refilled   Details  citalopram (CELEXA) 20 MG tablet Take 1 tablet (20 mg total) by mouth daily. Qty: 30 tablet, Refills: 0    lisinopril (PRINIVIL,ZESTRIL) 20 MG tablet Take 1 tablet (20 mg total) by mouth daily. Qty: 30 tablet, Refills: 6      CONTINUE these medications which have NOT CHANGED   Details  levonorgestrel (MIRENA) 20 MCG/24HR IUD 1 each by Intrauterine route once.         Disposition   The patient will be discharged in stable condition to home. Discharge Instructions    Diet - low sodium heart healthy    Complete by:  As directed   Diabetic Diet     Increase activity slowly    Complete by:  As directed   No driving for 2 weeks. No lifting over 10 lbs for 4 weeks. No sexual activity for 4 weeks. You may not return to work until cleared by your cardiologist. Keep procedure site clean & dry. If you notice increased pain, swelling, bleeding or pus, call/return!  You may shower, but no soaking baths/hot tubs/pools for 1 week.          Follow-up Information    Follow up with Jory Sims, NP.   Specialties:  Nurse Practitioner, Radiology, Cardiology   Why:  [facility] - Linna Hoff office at [facility] - 08/04/14 at 1:50pm   Contact information:   [place] [address] [phone]       Follow up with [facility] On 08/03/2014.   Why:  appt at 12noon please be there at 11:45am   Contact information:   [address] [place]  [place] [address] [phone]        Duration of Discharge Encounter: Greater than 30 minutes including physician and PA time.  Raechel Ache PA-C 07/28/2014, 10:46 AM

## 2014-07-28 NOTE — Progress Notes (Signed)
K normal. Nurse also requested rx for glucometer and testing supplies which I have provided on paper copy. Further refills per PCP.  Warren Kugelman PA-C

## 2014-07-28 NOTE — Progress Notes (Signed)
Spoke with patient about diabetes and home regimen for diabetes control. Patient reports that she has had diabetes for 5 years and she is not currently being followed by a doctor. Patient states that she was taking Metformin 1000 mg BID as an outpatient up until about 3-4 months ago when she ran out of her prescription. Provided and reviewed Living Well with Diabetes booklet with patient. Discussed A1C results (11.9% on 07/23/14) and explained what an A1C is, basic pathophysiology of DM Type 2, basic home care, importance of checking CBGs and maintaining good CBG control to prevent long-term and short-term complications. Reviewed in detail how hyperglycemia damages blood vessels and leads to complications associated with uncontrolled diabetes. Discussed impact of nutrition, exercise, stress, sickness, and medications on diabetes control.  Patient states that she has not been eating the right foods but she states that she does not drink sugary drinks.  Discussed carbohydrates, carbohydrate goals per day and meal, along with portion sizes. Reviewed how to read a nutrition label. Patient does not have a glucometer or testing supplies. Informed patient about Reli-On Glucometer and testing supplies which she can purchase from [facility] for $14 and a box of 50 test strips for $9. Patient reports that she is planning to follow up at the [facility] and [facility] and already has an appointment set up. Since the patient is going to follow up with the [facility] she can take a prescription for a glucometer and testing supplies to the [facility] pharmacy and they will provide her with a glucometer and testing supplies. Asked RN to have doctor provide patient with a prescription for a glucometer and testing supplies. Asked patient to check glucose at least 2 times per day (first thing in the morning before eating or drinking anything and then randomly before meals or bedtime on various days. Asked patient to be  sure to take her glucometer to follow up appointment at the [facility] so the doctor can review the information and make adjustments with DM medications if necessary. Discussed benefits of exercise and encouraged patient to be active at least 30 mins per day if she is able to do so.  Patient verbalized understanding of information discussed and she states that she has no further questions at this time related to diabetes.    Thanks, Barnie Alderman, RN, MSN, CCRN, CDE Diabetes Coordinator Inpatient Diabetes Program [phone] (Team Pager) [phone] ([facility] office) [phone] [facility] office) [phone] [facility] office)

## 2014-07-28 NOTE — Progress Notes (Signed)
Patient had catheter based thrombectomy yesterday without requirement for stent in RCA. Risk factors are terribly out of control. Has been off meds for several months. Needs Care management consult to help. Plan Home today with DAPT for 3 months. Needs to resume therapy for DM and cholesterol. Cautioned to avoid smoking.

## 2014-07-28 NOTE — Progress Notes (Signed)
CARDIAC REHAB PHASE I   PRE:  Rate/Rhythm: 68 SR  BP:  Supine:   Sitting: 166/78  Standing:    SaO2: 96%RA  MODE:  Ambulation: 270 ft   POST:  Rate/Rhythm: 78 SR  BP:  Supine:   Sitting: 137/72  Standing:    SaO2: 98%RA 0905-1020 Pt walked 270 ft on RA with steady gait. Only able to walk short distance due to hip bothering her from arthritis. No CP. Tolerated well. MI education completed with pt who was very receptive. Discussed risk factor modification especially not smoking and better control of diabetes. Pt stated did not have primary MD here so she had run out of prescription for metformin. Discussed her AIC of 11.9. Pt has never checked her sugars with a meter. Discussed carb counting and asked case manager to see again. Discussed smoking cessation and gave fake cigarette. Encouraged pt to walk as tolerated to help with weight loss and better diabetic control. Will have to start slowly due to arthritic issues. No transportation for CRP 2.  Stressed importance of brilinta.    Graylon Good, RN BSN  07/28/2014 10:10 AM

## 2014-07-28 NOTE — Progress Notes (Signed)
Spoke w pt. Enc her to get cbg meter and strips at [employer] [facility] for 11.00 each. She prefers [facility] for pcp. appt made for 6-27 at 12noon. Will fax in [place] pt assist form and give to pt to mail in w proof of income. She has 30day free brilinta card. Phone  [phone] or sister Estill Bamberg at [phone].

## 2014-07-28 NOTE — Progress Notes (Signed)
Discharged home accompanied by brother in-law, stable, discharged instruction given, belongings with pt.

## 2014-08-03 ENCOUNTER — Encounter: Payer: Self-pay | Admitting: Family Medicine

## 2014-08-03 ENCOUNTER — Ambulatory Visit: Payer: Medicaid Other | Attending: Family Medicine | Admitting: Family Medicine

## 2014-08-03 VITALS — BP 114/68 | HR 71 | Temp 98.6°F | Ht 65.0 in | Wt 305.6 lb

## 2014-08-03 DIAGNOSIS — I2111 ST elevation (STEMI) myocardial infarction involving right coronary artery: Secondary | ICD-10-CM | POA: Diagnosis not present

## 2014-08-03 DIAGNOSIS — J849 Interstitial pulmonary disease, unspecified: Secondary | ICD-10-CM | POA: Diagnosis not present

## 2014-08-03 DIAGNOSIS — E1165 Type 2 diabetes mellitus with hyperglycemia: Secondary | ICD-10-CM | POA: Diagnosis not present

## 2014-08-03 DIAGNOSIS — Z720 Tobacco use: Secondary | ICD-10-CM | POA: Diagnosis not present

## 2014-08-03 DIAGNOSIS — F17200 Nicotine dependence, unspecified, uncomplicated: Secondary | ICD-10-CM

## 2014-08-03 LAB — GLUCOSE, POCT (MANUAL RESULT ENTRY): POC Glucose: 199 mg/dl — AB (ref 70–99)

## 2014-08-03 MED ORDER — GLUCOSE BLOOD VI STRP: ORAL_STRIP | Status: DC

## 2014-08-03 MED ORDER — TRUE METRIX METER W/DEVICE KIT: 1.0000 | PACK | Freq: Three times a day (TID) | Status: DC

## 2014-08-03 MED ORDER — TRUEPLUS LANCETS 30G MISC: 1.0000 | Freq: Three times a day (TID) | Status: DC

## 2014-08-03 NOTE — Patient Instructions (Signed)
Diabetes Mellitus and Food It is important for you to manage your blood sugar (glucose) level. Your blood glucose level can be greatly affected by what you eat. Eating healthier foods in the appropriate amounts throughout the day at about the same time each day will help you control your blood glucose level. It can also help slow or prevent worsening of your diabetes mellitus. Healthy eating may even help you improve the level of your blood pressure and reach or maintain a healthy weight.  HOW CAN FOOD AFFECT ME? Carbohydrates Carbohydrates affect your blood glucose level more than any other type of food. Your dietitian will help you determine how many carbohydrates to eat at each meal and teach you how to count carbohydrates. Counting carbohydrates is important to keep your blood glucose at a healthy level, especially if you are using insulin or taking certain medicines for diabetes mellitus. Alcohol Alcohol can cause sudden decreases in blood glucose (hypoglycemia), especially if you use insulin or take certain medicines for diabetes mellitus. Hypoglycemia can be a life-threatening condition. Symptoms of hypoglycemia (sleepiness, dizziness, and disorientation) are similar to symptoms of having too much alcohol.  If your health care provider has given you approval to drink alcohol, do so in moderation and use the following guidelines:  Women should not have more than one drink per day, and men should not have more than two drinks per day. One drink is equal to:  12 oz of beer.  5 oz of wine.  1 oz of hard liquor.  Do not drink on an empty stomach.  Keep yourself hydrated. Have water, diet soda, or unsweetened iced tea.  Regular soda, juice, and other mixers might contain a lot of carbohydrates and should be counted. WHAT FOODS ARE NOT RECOMMENDED? As you make food choices, it is important to remember that all foods are not the same. Some foods have fewer nutrients per serving than other  foods, even though they might have the same number of calories or carbohydrates. It is difficult to get your body what it needs when you eat foods with fewer nutrients. Examples of foods that you should avoid that are high in calories and carbohydrates but low in nutrients include:  Trans fats (most processed foods list trans fats on the Nutrition Facts label).  Regular soda.  Juice.  Candy.  Sweets, such as cake, pie, doughnuts, and cookies.  Fried foods. WHAT FOODS CAN I EAT? Have nutrient-rich foods, which will nourish your body and keep you healthy. The food you should eat also will depend on several factors, including:  The calories you need.  The medicines you take.  Your weight.  Your blood glucose level.  Your blood pressure level.  Your cholesterol level. You also should eat a variety of foods, including:  Protein, such as meat, poultry, fish, tofu, nuts, and seeds (lean animal proteins are best).  Fruits.  Vegetables.  Dairy products, such as milk, cheese, and yogurt (low fat is best).  Breads, grains, pasta, cereal, rice, and beans.  Fats such as olive oil, trans fat-free margarine, canola oil, avocado, and olives. DOES EVERYONE WITH DIABETES MELLITUS HAVE THE SAME MEAL PLAN? Because every person with diabetes mellitus is different, there is not one meal plan that works for everyone. It is very important that you meet with a dietitian who will help you create a meal plan that is just right for you. Document Released: 10/20/2004 Document Revised: 01/28/2013 Document Reviewed: 12/20/2012 Exit[employer] Patient Information 2015 [employer]. This   information is not intended to replace advice given to you by your health care provider. Make sure you discuss any questions you have with your health care provider.  

## 2014-08-03 NOTE — Progress Notes (Signed)
Patient here to establish care after recent MI-she reports no pain just SOB She is not going to outpatient cardiac rehab because she cannot drive and they live in [place] and her sister works in [place] She has two younger nieces/nephews who help her walk a little each day Patient needs glucometer to check her sugars and has a Rx given to here at her cardiology visit She is asking for [place] to get her medical records from [place]  She is interested in quitting smoking-smokes 1/2 ppd

## 2014-08-03 NOTE — Progress Notes (Signed)
Quick Note:  Labs addressed at office as well as medications and patient was made aware. ______ 

## 2014-08-03 NOTE — Progress Notes (Signed)
Subjective:    Patient ID: Joanna [name], female    DOB: 06/17/1970, 44 y.o.   MRN: [id number]  HPI  Admit date: 07/23/14 Discharge date: 07/28/14  Joanna [name] is a 44 year old female with a history of interstitial lung disease, diabetes mellitus, hyperlipidemia, hypertension who presented to the ED with chest pains and EKG revealed inferior STEMI and she was treated with nitroglycerin and aspirin and heparin and transferred to [facility] for further evaluation..  Emergent cath 07/23/14 showed long thrombotic lesion in the proximal to mid RCA with TIMI 3 flow, 75% stenosed. She was treated with aggressive antiplatelet and antithrombotic therapy with ASA, Brilinta, IV heparin and IV tirofiban. LVEF was normal at 55-65%. The plan was to consider repeat angiography 3-4 days to reassess lesion severity She underwent planned relook cath on 07/27/14 showing Prox RCA to Mid RCA lesion, 90% stenosed. She had successful percutaneous thrombectomy with a Pronto catheter with very large debris removal. There was a 0% residual stenosis post intervention  She was placed on pravastatin because she could not afford a more potent statin and also placed back on her other medications.  Interval history: She does have some shortness of breath; she does not see a pulmonologist for her interstitial lung disease.   Past Medical History  Diagnosis Date  . Interstitial lung disease   . Diabetes type 2, uncontrolled   . Hypertension   . Morbid obesity   . Non compliance w medication regimen   . STEMI (ST elevation myocardial infarction) 07/23/14  . Depression   . Tobacco abuse   . CAD (coronary artery disease)     a. Inferior STEMI 07/2014 - s/p thrombectomy of RCA, no stenting.    Past Surgical History  Procedure Laterality Date  . Cardiac catheterization N/A 07/23/2014    Procedure: Left Heart Cath and Coronary Angiography;  Surgeon: Peter M Martinique, MD;  Location: [place] [facility];   Service: Cardiovascular;  Laterality: N/A;  . Cardiac catheterization N/A 07/27/2014    Procedure: Left Heart Cath and Coronary Angiography;  Surgeon: Jettie Booze, MD;  Location: [place] [facility];  Service: Cardiovascular;  Laterality: N/A;  . Cardiac catheterization N/A 07/27/2014    Procedure: Coronary Balloon Angioplasty;  Surgeon: Jettie Booze, MD;  Location: [place] [facility];  Service: Cardiovascular;  Laterality: N/A;  thrombectomy only    History   Social History  . Marital Status: Single    Spouse Name: N/A  . Number of Children: N/A  . Years of Education: N/A   Occupational History  . Not on file.   Social History Main Topics  . Smoking status: Current Every Day Smoker -- 0.50 packs/day    Types: Cigarettes    Start date: 08/04/1983  . Smokeless tobacco: Never Used  . Alcohol Use: No  . Drug Use: No  . Sexual Activity: Not on file   Other Topics Concern  . Not on file   Social History Narrative    Family History  Problem Relation Age of Onset  . Coronary artery disease Father 31    CABG    Allergies  Allergen Reactions  . Wellbutrin [Bupropion] Rash    Current Outpatient Prescriptions on File Prior to Visit  Medication Sig Dispense Refill  . aspirin EC 81 MG tablet Take 1 tablet (81 mg total) by mouth daily. 30 tablet 11  . citalopram (CELEXA) 20 MG tablet Take 1 tablet (20 mg total) by mouth  daily. 30 tablet 0  . levonorgestrel (MIRENA) 20 MCG/24HR IUD 1 each by Intrauterine route once.     Marland Kitchen lisinopril (PRINIVIL,ZESTRIL) 20 MG tablet Take 1 tablet (20 mg total) by mouth daily. 30 tablet 6  . metFORMIN (GLUCOPHAGE) 500 MG tablet Take 1 tablet (500 mg total) by mouth 2 (two) times daily with a meal. 60 tablet 0  . metoprolol (LOPRESSOR) 50 MG tablet Take 1 tablet (50 mg total) by mouth 2 (two) times daily. 60 tablet 6  . nitroGLYCERIN (NITROSTAT) 0.4 MG SL tablet Place 1 tablet (0.4 mg total) under the tongue every 5 (five) minutes as  needed for chest pain (up to 3 doses). 25 tablet 3  . pravastatin (PRAVACHOL) 80 MG tablet Take 1 tablet (80 mg total) by mouth every evening. 30 tablet 6  . ticagrelor (BRILINTA) 90 MG TABS tablet Take 1 tablet (90 mg total) by mouth 2 (two) times daily. 60 tablet 1   No current facility-administered medications on file prior to visit.      Review of Systems  Constitutional: Negative for activity change, appetite change and fatigue.  HENT: Negative for congestion, sinus pressure and sore throat.   Eyes: Negative for visual disturbance.  Respiratory: Positive for shortness of breath. Negative for cough, chest tightness and wheezing.   Cardiovascular: Negative for chest pain and palpitations.  Gastrointestinal: Negative for abdominal pain, constipation and abdominal distention.  Endocrine: Negative for polydipsia.  Genitourinary: Negative for dysuria and frequency.  Musculoskeletal: Negative for back pain and arthralgias.  Skin: Negative for rash.  Neurological: Negative for tremors, light-headedness and numbness.  Hematological: Does not bruise/bleed easily.  Psychiatric/Behavioral: Negative for behavioral problems and agitation.         Objective: Filed Vitals:   08/03/14 1214  BP: 114/68  Pulse: 71  Temp: 98.6 F (37 C)  Height: 5\' 5"  (1.651 m)  Weight: 305 lb 9.6 oz (138.619 kg)  SpO2: 96%      Physical Exam  Constitutional: She is oriented to person, place, and time. She appears well-developed and well-nourished. No distress.  HENT:  Head: Normocephalic.  Right Ear: External ear normal.  Left Ear: External ear normal.  Nose: Nose normal.  Mouth/Throat: Oropharynx is clear and moist.  Eyes: Conjunctivae and EOM are normal. Pupils are equal, round, and reactive to light.  Neck: Normal range of motion. No JVD present.  Cardiovascular: Normal rate, regular rhythm, normal heart sounds and intact distal pulses.  Exam reveals no gallop.   No murmur  heard. Pulmonary/Chest: Effort normal and breath sounds normal. No respiratory distress. She has no wheezes. She has no rales. She exhibits no tenderness.  Abdominal: Soft. Bowel sounds are normal. She exhibits no distension and no mass. There is no tenderness.  Musculoskeletal: Normal range of motion. She exhibits no edema or tenderness.  Right wrist with cath site on flexor aspect of wrist - healing well.  Neurological: She is alert and oriented to person, place, and time. She has normal reflexes.  Skin: Skin is warm and dry. She is not diaphoretic.  Psychiatric: She has a normal mood and affect.          Assessment & Plan:  44 year old female with a history of interstitial lung disease, diabetes mellitus, hyperlipidemia, hypertension, CAD status post ST elevation MI and post thrombectomy of the RCA and subsequent relook here for follow-up visit.   Coronary artery disease: Continue Brilinta and aspirin Advised to keep appointment with cardiologist tomorrow  type  2 diabetes mellitus:  uncontrolled with A1c of 11.9 , CBG 199 the clinic today  this is largely due to previous noncompliance.  Hopefully now that she has her medications this should be controlled   advised to schedule annual eye exam.   foot exam done today , she is up-to-date on Pneumovax , will need microalbumin at her follow-up visit with her PCP.   Interstitial lung disease:  this could explain current dyspnea that she has ; oxygen saturation is 96%. She is not on any medications for this currently and I am referring her to pulmonary for optimization of management.   Tobacco cessation: Smoking cessation support: smoking cessation hotline: 1-800-QUIT-NOW.  Smoking cessation classes are available through [facility]. Call [phone] or visit our website at [email or web address]  Spent 3 minutes counseling on smoking cessation and patient is not ready to quit.  This note has been created  with Surveyor, quantity. Any transcriptional errors are unintentional.

## 2014-08-04 ENCOUNTER — Encounter: Payer: Self-pay | Admitting: Adult Health

## 2014-08-04 ENCOUNTER — Telehealth: Payer: Self-pay | Admitting: *Deleted

## 2014-08-04 ENCOUNTER — Ambulatory Visit (INDEPENDENT_AMBULATORY_CARE_PROVIDER_SITE_OTHER): Payer: Self-pay | Admitting: Adult Health

## 2014-08-04 VITALS — BP 100/64 | HR 62 | Ht 65.0 in | Wt 303.0 lb

## 2014-08-04 DIAGNOSIS — Z136 Encounter for screening for cardiovascular disorders: Secondary | ICD-10-CM

## 2014-08-04 DIAGNOSIS — I10 Essential (primary) hypertension: Secondary | ICD-10-CM

## 2014-08-04 MED ORDER — AMLODIPINE BESYLATE 2.5 MG PO TABS: 2.5000 mg | ORAL_TABLET | Freq: Every day | ORAL | Status: DC

## 2014-08-04 NOTE — Progress Notes (Deleted)
Name: Joanna [name]    DOB: [birth year]  Age: 44 y.o.  MR#: [id number]       PCP:  No primary care provider on file.      Insurance: Payor: MEDICAID POTENTIAL / Plan: MEDICAID POTENTIAL / Product Type: *No Product type* /   CC:    Chief Complaint  Patient presents with  . Coronary Artery Disease  . Hypertension    VS Filed Vitals:   08/04/14 1349  BP: 100/64  Pulse: 62  Height: _0  (1.651 m)  Weight: 303 lb (137.44 kg)  SpO2: 97%    Weights Current Weight  08/04/14 303 lb (137.44 kg)  08/03/14 305 lb 9.6 oz (138.619 kg)  07/28/14 309 lb 1.4 oz (140.2 kg)    Blood Pressure  BP Readings from Last 3 Encounters:  08/04/14 100/64  08/03/14 114/68  07/28/14 137/76     Admit date:  (Not on file) Last encounter with RMR:  Visit date not found   Allergy Wellbutrin  Current Outpatient Prescriptions  Medication Sig Dispense Refill  . amLODipine (NORVASC) 5 MG tablet Take 1 tablet (5 mg total) by mouth daily. 30 tablet 6  . aspirin EC 81 MG tablet Take 1 tablet (81 mg total) by mouth daily. 30 tablet 11  . Blood Glucose Monitoring Suppl (TRUE METRIX METER) W/DEVICE KIT 1 each by Does not apply route 3 (three) times daily. 1 kit 0  . citalopram (CELEXA) 20 MG tablet Take 1 tablet (20 mg total) by mouth daily. 30 tablet 0  . glucose blood (TRUE METRIX BLOOD GLUCOSE TEST) test strip Use as instructed 100 each 12  . levonorgestrel (MIRENA) 20 MCG/24HR IUD 1 each by Intrauterine route once.     Marland Kitchen lisinopril (PRINIVIL,ZESTRIL) 20 MG tablet Take 1 tablet (20 mg total) by mouth daily. 30 tablet 6  . metFORMIN (GLUCOPHAGE) 500 MG tablet Take 1 tablet (500 mg total) by mouth 2 (two) times daily with a meal. 60 tablet 0  . metoprolol (LOPRESSOR) 50 MG tablet Take 1 tablet (50 mg total) by mouth 2 (two) times daily. 60 tablet 6  . nitroGLYCERIN (NITROSTAT) 0.4 MG SL tablet Place 1 tablet (0.4 mg total) under the tongue every 5 (five) minutes as needed for chest pain (up to 3 doses). 25 tablet  3  . pravastatin (PRAVACHOL) 80 MG tablet Take 1 tablet (80 mg total) by mouth every evening. 30 tablet 6  . ticagrelor (BRILINTA) 90 MG TABS tablet Take 1 tablet (90 mg total) by mouth 2 (two) times daily. 60 tablet 1  . TRUEPLUS LANCETS 30G MISC 1 each by Does not apply route 3 (three) times daily. 100 each 5   No current facility-administered medications for this visit.    Discontinued Meds:   There are no discontinued medications.  Patient Active Problem List   Diagnosis Date Noted  . CAD (coronary artery disease) 07/28/2014  . Leukocytosis 07/28/2014  . STEMI (ST elevation myocardial infarction) 07/23/2014  . Family history of coronary artery disease in father 07/23/2014  . ST elevation myocardial infarction (STEMI) involving right coronary artery with complication 92/33/0076  . Interstitial lung disease   . Uncontrolled diabetes mellitus   . Hypertension   . Morbid obesity   . Non compliance w medication regimen   . Depression   . Smoker     LABS    Component Value Date/Time   NA 134* 07/28/2014 0237   NA 136 07/27/2014 1022   NA 134* 07/24/2014 0126  K 3.8 07/28/2014 1043   K 5.5* 07/28/2014 0237   K 3.7 07/27/2014 1022   CL 102 07/28/2014 0237   CL 103 07/27/2014 1022   CL 99* 07/24/2014 0126   CO2 21* 07/28/2014 0237   CO2 25 07/27/2014 1022   CO2 27 07/24/2014 0126   GLUCOSE 141* 07/28/2014 0237   GLUCOSE 148* 07/27/2014 1022   GLUCOSE 192* 07/24/2014 0126   BUN 7 07/28/2014 0237   BUN 6 07/27/2014 1022   BUN 8 07/24/2014 0126   CREATININE 0.61 07/28/2014 0237   CREATININE 0.60 07/27/2014 1022   CREATININE 0.72 07/24/2014 0126   CALCIUM 8.3* 07/28/2014 0237   CALCIUM 8.8* 07/27/2014 1022   CALCIUM 8.3* 07/24/2014 0126   GFRNONAA >60 07/28/2014 0237   GFRNONAA >60 07/27/2014 1022   GFRNONAA >60 07/24/2014 0126   GFRAA >60 07/28/2014 0237   GFRAA >60 07/27/2014 1022   GFRAA >60 07/24/2014 0126   CMP     Component Value Date/Time   NA 134*  07/28/2014 0237   K 3.8 07/28/2014 1043   CL 102 07/28/2014 0237   CO2 21* 07/28/2014 0237   GLUCOSE 141* 07/28/2014 0237   BUN 7 07/28/2014 0237   CREATININE 0.61 07/28/2014 0237   CALCIUM 8.3* 07/28/2014 0237   PROT 7.8 07/23/2014 1205   ALBUMIN 3.3* 07/23/2014 1205   AST 29 07/23/2014 1205   ALT 14 07/23/2014 1205   ALKPHOS 90 07/23/2014 1205   BILITOT 0.4 07/23/2014 1205   GFRNONAA >60 07/28/2014 0237   GFRAA >60 07/28/2014 0237       Component Value Date/Time   WBC 12.3* 07/28/2014 0600   WBC 15.0* 07/27/2014 0315   WBC 13.6* 07/26/2014 0253   HGB 12.9 07/28/2014 0600   HGB 12.3 07/27/2014 0315   HGB 12.2 07/26/2014 0253   HCT 40.1 07/28/2014 0600   HCT 38.7 07/27/2014 0315   HCT 37.9 07/26/2014 0253   MCV 80.0 07/28/2014 0600   MCV 80.8 07/27/2014 0315   MCV 78.8 07/26/2014 0253    Lipid Panel     Component Value Date/Time   CHOL 210* 07/24/2014 0126   TRIG 327* 07/24/2014 0126   HDL 20* 07/24/2014 0126   CHOLHDL 10.5 07/24/2014 0126   VLDL 65* 07/24/2014 0126   LDLCALC 125* 07/24/2014 0126    ABG No results found for: PHART, PCO2ART, PO2ART, HCO3, TCO2, ACIDBASEDEF, O2SAT   Lab Results  Component Value Date   TSH 3.540 07/23/2014   BNP (last 3 results) No results for input(s): BNP in the last 8760 hours.  ProBNP (last 3 results) No results for input(s): PROBNP in the last 8760 hours.  Cardiac Panel (last 3 results) No results for input(s): CKTOTAL, CKMB, TROPONINI, RELINDX in the last 72 hours.  Iron/TIBC/Ferritin/ %Sat No results found for: IRON, TIBC, FERRITIN, IRONPCTSAT   EKG Orders placed or performed during the hospital encounter of 07/23/14  . EKG 12-Lead  . EKG 12-Lead  . EKG 12-Lead  . EKG 12-Lead  . EKG  . EKG 12-Lead  . EKG 12-Lead immediately post procedure  . EKG 12-Lead  . EKG 12-Lead immediately post procedure  . EKG 12-Lead     Prior Assessment and Plan Problem List as of 08/04/2014      Cardiovascular and  Mediastinum   STEMI (ST elevation myocardial infarction)   Hypertension   ST elevation myocardial infarction (STEMI) involving right coronary artery with complication   CAD (coronary artery disease)     Respiratory  Interstitial lung disease     Other   Uncontrolled diabetes mellitus   Morbid obesity   Non compliance w medication regimen   Depression   Smoker   Family history of coronary artery disease in father   Leukocytosis       Imaging: Dg Chest Port 1 View  07/23/2014   CLINICAL DATA:  One day history of chest pain  EXAM: PORTABLE CHEST - 1 VIEW  COMPARISON:  March 02, 2014  FINDINGS: Interstitium is somewhat prominent but stable. There are scattered small calcified granulomas in each upper lobe, stable. There is no frank edema or consolidation. Heart is upper normal in size with pulmonary vascularity within normal limits. No adenopathy.  IMPRESSION: Stable interstitial prominence, consistent with a degree of chronic interstitial pneumonitis of uncertain etiology. There are scattered tiny granulomas bilaterally. There is no frank edema or consolidation. No change in cardiac silhouette allowing for change in technique and positioning.   Electronically Signed   By: Lowella Grip III M.D.   On: 07/23/2014 12:27

## 2014-08-04 NOTE — Progress Notes (Signed)
Cardiology Office Note   Date:  08/04/2014   ID:  Joanna [name], DOB Aug 15, 1970, MRN [id number]  PCP:  No primary care provider on file.  Cardiologist:  To be Est in [place]/ Jory Sims, NP   Chief Complaint  Patient presents with  . Coronary Artery Disease  . Hypertension      History of Present Illness: Joanna [name] is a 44 y.o. female who presents for ongoing assessment and management of coronary artery disease, status post hospitalization in June of 2016 in the setting of an inferior ST elevation MI 07/23/2014 with this thrombectomy during relook cath on 07/27/2014. She has multiple cardiovascular risk factors to include hypertension, uncontrolled, diabetes, family history, hyperlipidemia, and ongoing tobacco abuse.  The patient has been nonadherent to medications prior to admission.  Check cardiac catheterization showed a long thrombotic lesion of the mid RCA with TIMI 3 flow, 35% stenosis.  There treated with aggressive antiplatelet) thrombotic therapy with aspirin and Brilinta..  In light lipid panel revealed hypertriglyceridemia with triglycerides of 327, HDL 20 LDL 125 A1c was 9, control 11.9.  The patient underwent a planned relook cardiac catheterization on 07/27/2014 showing proximal RCA to mid RCA lesion, 90% stenosis.  She had successful percutaneous thrombectomy with very large debris removal.  She was seen by care management to assist with medications.  She was given 30 day supply of Brilinta via coupon. She is to be on this for a minimum of 3 months. She was given pravastatin instead of high potency statin as she could not afford it.  She has been established with the [facility] on 08/03/2014 for assistance in medication and follow non-cardiac appt.   She comes today feeling well without any cardiac complaints.  She is walking 15 minutes a day with family members.  She states that the episode in the hospital scared her very severely.  She lost her  brother in February and had recently moved to [place] as a result.  Family members are walking with her making sure she takes her medications as directed.  She sometimes rides a stationary bike.  She denies recurrent chest discomfort.  She does have some shortness of breath related to her obesity with exercise.  She is medically compliant.  She offers no complaints concerning her medicines causing nausea, vomiting, rash, or bleeding.she has not yet started.  Cardiac rehabilitation.  She does not have transportation at this time.   Past Medical History  Diagnosis Date  . Interstitial lung disease   . Diabetes type 2, uncontrolled   . Hypertension   . Morbid obesity   . Non compliance w medication regimen   . STEMI (ST elevation myocardial infarction) 07/23/14  . Depression   . Tobacco abuse   . CAD (coronary artery disease)     a. Inferior STEMI 07/2014 - s/p thrombectomy of RCA, no stenting.    Past Surgical History  Procedure Laterality Date  . Cardiac catheterization N/A 07/23/2014    Procedure: Left Heart Cath and Coronary Angiography;  Surgeon: Peter M Martinique, MD;  Location: [place] [facility];  Service: Cardiovascular;  Laterality: N/A;  . Cardiac catheterization N/A 07/27/2014    Procedure: Left Heart Cath and Coronary Angiography;  Surgeon: Jettie Booze, MD;  Location: [place] [facility];  Service: Cardiovascular;  Laterality: N/A;  . Cardiac catheterization N/A 07/27/2014    Procedure: Coronary Balloon Angioplasty;  Surgeon: Jettie Booze, MD;  Location: [place] [facility];  Service: Cardiovascular;  Laterality: N/A;  thrombectomy only     Current Outpatient Prescriptions  Medication Sig Dispense Refill  . amLODipine (NORVASC) 5 MG tablet Take 1 tablet (5 mg total) by mouth daily. 30 tablet 6  . aspirin EC 81 MG tablet Take 1 tablet (81 mg total) by mouth daily. 30 tablet 11  . Blood Glucose Monitoring Suppl (TRUE METRIX METER) W/DEVICE KIT 1 each by Does not apply  route 3 (three) times daily. 1 kit 0  . citalopram (CELEXA) 20 MG tablet Take 1 tablet (20 mg total) by mouth daily. 30 tablet 0  . glucose blood (TRUE METRIX BLOOD GLUCOSE TEST) test strip Use as instructed 100 each 12  . levonorgestrel (MIRENA) 20 MCG/24HR IUD 1 each by Intrauterine route once.     Marland Kitchen lisinopril (PRINIVIL,ZESTRIL) 20 MG tablet Take 1 tablet (20 mg total) by mouth daily. 30 tablet 6  . metFORMIN (GLUCOPHAGE) 500 MG tablet Take 1 tablet (500 mg total) by mouth 2 (two) times daily with a meal. 60 tablet 0  . metoprolol (LOPRESSOR) 50 MG tablet Take 1 tablet (50 mg total) by mouth 2 (two) times daily. 60 tablet 6  . nitroGLYCERIN (NITROSTAT) 0.4 MG SL tablet Place 1 tablet (0.4 mg total) under the tongue every 5 (five) minutes as needed for chest pain (up to 3 doses). 25 tablet 3  . pravastatin (PRAVACHOL) 80 MG tablet Take 1 tablet (80 mg total) by mouth every evening. 30 tablet 6  . ticagrelor (BRILINTA) 90 MG TABS tablet Take 1 tablet (90 mg total) by mouth 2 (two) times daily. 60 tablet 1  . TRUEPLUS LANCETS 30G MISC 1 each by Does not apply route 3 (three) times daily. 100 each 5   No current facility-administered medications for this visit.    Allergies:   Wellbutrin    Social History:  The patient  reports that she has been smoking Cigarettes.  She started smoking about 31 years ago. She has been smoking about 0.50 packs per day. She has never used smokeless tobacco. She reports that she does not drink alcohol or use illicit drugs.   Family History:  The patient's family history includes Coronary artery disease (age of onset: 65) in her father.    ROS: .   All other systems are reviewed and negative.Unless otherwise mentioned in H&P above.   PHYSICAL EXAM: VS:  BP 100/64 mmHg  Pulse 62  Ht _0  (1.651 m)  Wt 303 lb (137.44 kg)  BMI 50.42 kg/m2  SpO2 97% , BMI Body mass index is 50.42 kg/(m^2). GEN: Well nourished, well developed, in no acute distressmorbidly  obese. HEENT: normal Neck: no JVD, carotid bruits, or masses Cardiac: andRRR; no murmurs, rubs, or gallops,no edema  Respiratory:  clear to auscultation bilaterally, normal work of breathing GI: soft, nontender, nondistended, + BS MS: no deformity or atrophy Skin: warm and dry, no rash Neuro:  Strength and sensation are intact Psych: euthymic mood, full affect  Recent Labs: 07/23/2014: ALT 14; TSH 3.540 07/28/2014: BUN 7; Creatinine, Ser 0.61; Hemoglobin 12.9; Platelets 247; Potassium 3.8; Sodium 134*    Lipid Panel    Component Value Date/Time   CHOL 210* 07/24/2014 0126   TRIG 327* 07/24/2014 0126   HDL 20* 07/24/2014 0126   CHOLHDL 10.5 07/24/2014 0126   VLDL 65* 07/24/2014 0126   LDLCALC 125* 07/24/2014 0126      Wt Readings from Last 3 Encounters:  08/04/14 303 lb (137.44 kg)  08/03/14 305 lb 9.6 oz (  138.619 kg)  07/28/14 309 lb 1.4 oz (140.2 kg)      Other studies Reviewed: Additional studies/ records that were reviewed today include: None Review of the above records demonstrates: N/A   ASSESSMENT AND PLAN:  1. CAD:  Status post injury or Wo ST elevation MI, status post thrombectomy of the right coronary artery is relook on 07/27/2014.  The patient is without complaint, medically compliant, beginning a walking routine, is without chest discomfort, but does have some mild dyspnea on exertion.  I will continue her on aspirin, lisinopril, metoprolol, Pravachol, and Brilinta.  She is getting medicines from a wellness clinic in [place].  2.Hypertension:  Blood pressure is low normal.  Today.  I will decrease her amlodipine to 2.5 mg daily.  3. OSA: the patient has a known history of obstructive sleep apnea.  She is getting her CPAP reinstated to the wellness clinic.  I am hopeful that she is going to start this if this will help her with her sleep and energy level, which will allow her to become more active.  Current medicines are reviewed at length with the patient  today.    Labs/ tests ordered today include:BMET, CBC, fasting lipids and LFTs.in one month.  No orders of the defined types were placed in this encounter.     Disposition:   FU with 3 months   Signed, Jory Sims, NP  08/04/2014 1:59 PM    [facility] [address], [place], [place] [address] Phone: [phone]; Fax: [phone]

## 2014-08-04 NOTE — Patient Instructions (Signed)
Your physician recommends that you schedule a follow-up appointment in: 3 months  Your physician recommends that you return for lab work in: 1 month  Thank you for choosing [facility]!

## 2014-08-04 NOTE — Telephone Encounter (Signed)
-----   Message from Lendon Colonel, NP sent at 08/04/2014  2:36 PM EDT ----- Regarding: BP meds I rechecked the BP and saw it was low. I want to decrease amlodipine to 2.5 mg daily. Please let her know to cut it in half. Send new Rx if she needs it for wellness clinic.   Thanks!

## 2014-08-05 ENCOUNTER — Telehealth: Payer: Self-pay | Admitting: Clinical

## 2014-08-05 NOTE — Telephone Encounter (Signed)
F/u w pt, introduction to [facility] services at [facility]

## 2014-08-07 NOTE — Telephone Encounter (Signed)
Spoke with patient. Instructions given. All questions answered. Patient voiced understanding.

## 2014-08-24 ENCOUNTER — Ambulatory Visit: Payer: Self-pay | Admitting: Family Medicine

## 2014-09-03 ENCOUNTER — Other Ambulatory Visit (HOSPITAL_COMMUNITY)
Admission: RE | Admit: 2014-09-03 | Discharge: 2014-09-03 | Disposition: A | Discharge status: Home / Self Care | Payer: Medicaid Other | Source: Ambulatory Visit | Attending: Adult Health | Admitting: Adult Health

## 2014-09-03 ENCOUNTER — Other Ambulatory Visit: Payer: Self-pay | Admitting: Pharmacist

## 2014-09-03 DIAGNOSIS — Z79899 Other long term (current) drug therapy: Secondary | ICD-10-CM | POA: Diagnosis not present

## 2014-09-03 DIAGNOSIS — I10 Essential (primary) hypertension: Secondary | ICD-10-CM | POA: Diagnosis present

## 2014-09-03 LAB — BASIC METABOLIC PANEL
Anion gap: 9 (ref 5–15)
BUN: 9 mg/dL (ref 6–20)
CALCIUM: 8.9 mg/dL (ref 8.9–10.3)
CHLORIDE: 102 mmol/L (ref 101–111)
CO2: 26 mmol/L (ref 22–32)
Creatinine, Ser: 0.78 mg/dL (ref 0.44–1.00)
GFR calc Af Amer: >60 mL/min (ref >60)
GFR calc non Af Amer: >60 mL/min (ref >60)
GLUCOSE: 117 mg/dL — AB (ref 65–99)
Potassium: 3.6 mmol/L (ref 3.5–5.1)
SODIUM: 137 mmol/L (ref 135–145)

## 2014-09-03 LAB — HEPATIC FUNCTION PANEL
ALBUMIN: 3.5 g/dL (ref 3.5–5.0)
ALT: 14 U/L (ref 14–54)
AST: 18 U/L (ref 15–41)
Alkaline Phosphatase: 76 U/L (ref 38–126)
Bilirubin, Direct: 0.1 mg/dL (ref 0.1–0.5)
Indirect Bilirubin: 0.3 mg/dL (ref 0.3–0.9)
Total Bilirubin: 0.4 mg/dL (ref 0.3–1.2)
Total Protein: 7.4 g/dL (ref 6.5–8.1)

## 2014-09-03 LAB — CBC WITH DIFFERENTIAL/PLATELET
Basophils Absolute: 0.1 10*3/uL (ref 0.0–0.1)
Basophils Relative: 0 % (ref 0–1)
Eosinophils Absolute: 0.2 10*3/uL (ref 0.0–0.7)
Eosinophils Relative: 1 % (ref 0–5)
HCT: 42 % (ref 36.0–46.0)
Hemoglobin: 13.9 g/dL (ref 12.0–15.0)
Lymphocytes Relative: 25 % (ref 12–46)
Lymphs Abs: 3.9 10*3/uL (ref 0.7–4.0)
MCH: 26.9 pg (ref 26.0–34.0)
MCHC: 33.1 g/dL (ref 30.0–36.0)
MCV: 81.4 fL (ref 78.0–100.0)
Monocytes Absolute: 0.9 10*3/uL (ref 0.1–1.0)
Monocytes Relative: 6 % (ref 3–12)
Neutro Abs: 10.5 10*3/uL — ABNORMAL HIGH (ref 1.7–7.7)
Neutrophils Relative %: 68 % (ref 43–77)
Platelets: 301 10*3/uL (ref 150–400)
RBC: 5.16 MIL/uL — ABNORMAL HIGH (ref 3.87–5.11)
RDW: 15.5 % (ref 11.5–15.5)
WBC: 15.5 10*3/uL — ABNORMAL HIGH (ref 4.0–10.5)

## 2014-09-03 LAB — LIPID PANEL
CHOLESTEROL: 165 mg/dL (ref 0–200)
HDL: 22 mg/dL — ABNORMAL LOW (ref >40)
LDL Cholesterol: 104 mg/dL — ABNORMAL HIGH (ref 0–99)
TRIGLYCERIDES: 196 mg/dL — AB (ref <150)
Total CHOL/HDL Ratio: 7.5 RATIO
VLDL: 39 mg/dL (ref 0–40)

## 2014-09-03 MED ORDER — TRUEPLUS LANCETS 28G MISC
1.0000 | Freq: Three times a day (TID) | Status: DC
Start: 2014-09-03 — End: 2020-01-20

## 2014-09-10 ENCOUNTER — Telehealth: Payer: Self-pay | Admitting: Cardiovascular Disease

## 2014-09-10 NOTE — Telephone Encounter (Signed)
Patient requesting samples of Brilinta 90 mg / tg

## 2014-09-15 ENCOUNTER — Ambulatory Visit: Payer: Medicaid Other | Attending: Family Medicine | Admitting: Family Medicine

## 2014-09-15 ENCOUNTER — Encounter: Payer: Self-pay | Admitting: Family Medicine

## 2014-09-15 VITALS — BP 108/71 | HR 71 | Temp 98.2°F | Resp 16 | Ht 65.0 in | Wt 296.0 lb

## 2014-09-15 DIAGNOSIS — I2510 Atherosclerotic heart disease of native coronary artery without angina pectoris: Secondary | ICD-10-CM | POA: Insufficient documentation

## 2014-09-15 DIAGNOSIS — F17200 Nicotine dependence, unspecified, uncomplicated: Secondary | ICD-10-CM

## 2014-09-15 DIAGNOSIS — IMO0002 Reserved for concepts with insufficient information to code with codable children: Secondary | ICD-10-CM

## 2014-09-15 DIAGNOSIS — F17210 Nicotine dependence, cigarettes, uncomplicated: Secondary | ICD-10-CM | POA: Diagnosis not present

## 2014-09-15 DIAGNOSIS — E282 Polycystic ovarian syndrome: Secondary | ICD-10-CM | POA: Insufficient documentation

## 2014-09-15 DIAGNOSIS — E119 Type 2 diabetes mellitus without complications: Secondary | ICD-10-CM | POA: Diagnosis not present

## 2014-09-15 DIAGNOSIS — J849 Interstitial pulmonary disease, unspecified: Secondary | ICD-10-CM | POA: Diagnosis not present

## 2014-09-15 DIAGNOSIS — E1165 Type 2 diabetes mellitus with hyperglycemia: Secondary | ICD-10-CM

## 2014-09-15 DIAGNOSIS — Z114 Encounter for screening for human immunodeficiency virus [HIV]: Secondary | ICD-10-CM

## 2014-09-15 DIAGNOSIS — Z720 Tobacco use: Secondary | ICD-10-CM

## 2014-09-15 DIAGNOSIS — F32A Depression, unspecified: Secondary | ICD-10-CM

## 2014-09-15 DIAGNOSIS — F329 Major depressive disorder, single episode, unspecified: Secondary | ICD-10-CM

## 2014-09-15 DIAGNOSIS — Z23 Encounter for immunization: Secondary | ICD-10-CM

## 2014-09-15 DIAGNOSIS — N921 Excessive and frequent menstruation with irregular cycle: Secondary | ICD-10-CM | POA: Insufficient documentation

## 2014-09-15 LAB — GLUCOSE, POCT (MANUAL RESULT ENTRY): POC GLUCOSE: 147 mg/dL — AB (ref 70–99)

## 2014-09-15 MED ORDER — VARENICLINE TARTRATE 0.5 MG X 11 & 1 MG X 42 PO MISC: ORAL | Status: DC

## 2014-09-15 MED ORDER — METFORMIN HCL 1000 MG PO TABS: 1000.0000 mg | ORAL_TABLET | Freq: Two times a day (BID) | ORAL | Status: DC

## 2014-09-15 MED ORDER — VARENICLINE TARTRATE 1 MG PO TABS: 1.0000 mg | ORAL_TABLET | Freq: Two times a day (BID) | ORAL | Status: DC

## 2014-09-15 MED ORDER — PREGABALIN 75 MG PO CAPS: 75.0000 mg | ORAL_CAPSULE | Freq: Two times a day (BID) | ORAL | Status: DC

## 2014-09-15 MED ORDER — CITALOPRAM HYDROBROMIDE 20 MG PO TABS: 20.0000 mg | ORAL_TABLET | Freq: Every day | ORAL | Status: DC

## 2014-09-15 NOTE — Patient Instructions (Addendum)
Joanna [name],  Thank you for coming in today. It was a pleasure meeting you. I look forward to being your primary doctor.   1. Diabetes:  Diabetes blood sugar goals  Fasting (in AM before breakfast, 8 hrs of no eating or drinking (except water or unsweetened coffee or tea): 90-110 2 hrs after meals: < 160,   No low sugars: nothing < 70  Increase metformin to 1000 mg twice daily  2. Smoking: Smoking cessation support: smoking cessation hotline: 1-800-QUIT-NOW.  Smoking cessation classes are available through [facility]. Call [phone] or visit our website at [email or web address] Start chantix: Take one 0.5 mg tablet by mouth once daily for 3 days, then increase to one 0.5 mg tablet twice daily for 4 days, then increase to one 1 mg tablet twice daily. Set quit date for 8-35 days after starting chantix.  3.  interstitial lung disease: Autoimmune markers drawn today Keep pulmonology appt   F/u in 6 weeks with RN for blood sugar review  F/u with me in 3 months for diabetes   Dr. Adrian Blackwater

## 2014-09-15 NOTE — Assessment & Plan Note (Signed)
Screening HIV ordered  

## 2014-09-15 NOTE — Assessment & Plan Note (Signed)
Diabetes:  Diabetes blood sugar goals  Fasting (in AM before breakfast, 8 hrs of no eating or drinking (except water or unsweetened coffee or tea): 90-110 2 hrs after meals: < 160,   No low sugars: nothing < 70  Increase metformin to 1000 mg twice daily

## 2014-09-15 NOTE — Assessment & Plan Note (Signed)
Smoking: Smoking cessation support: smoking cessation hotline: 1-800-QUIT-NOW.  Smoking cessation classes are available through [facility]. Call [phone] or visit our website at [email or web address] Start chantix: Take one 0.5 mg tablet by mouth once daily for 3 days, then increase to one 0.5 mg tablet twice daily for 4 days, then increase to one 1 mg tablet twice daily. Set quit date for 8-35 days after starting chantix.

## 2014-09-15 NOTE — Assessment & Plan Note (Addendum)
Chronic  interstitial lung disease: without respiratory distress  Autoimmune markers drawn today Keep pulmonology appt

## 2014-09-15 NOTE — Progress Notes (Signed)
   Subjective:    Patient ID: Joanna [name], female    DOB: 1970-08-01, 44 y.o.   MRN: [id number] CC: DM  HPI 44 yo F f/u for diabetes:  CHRONIC DIABETES dx in 2010, patient has PCOS  Disease Monitoring  Blood Sugar Ranges: 110-140s  Polyuria: no   Visual problems: no   Medication Compliance: yes  Medication Side Effects  Hypoglycemia: no   Preventitive Health Care  Eye Exam: due   Foot Exam: done today     2. Smoking: has CAD, recent MI. Working to quit smoking. Down from 2.5 PPD to .5 PPD. Family members smoke outside. She has interstitial lung disease of unknown etiology.  3. ILD: etiology unknown. Was followed by a pulmonologist in [place], [place]. Records requested but not yet received. No SOB or chronic cough. Working to quit heavy smoking.    History  Substance Use Topics  . Smoking status: Current Every Day Smoker -- 0.50 packs/day    Types: Cigarettes    Start date: 08/04/1983  . Smokeless tobacco: Never Used  . Alcohol Use: No   Review of Systems  Constitutional: Negative for fever and chills.  Eyes: Negative for visual disturbance.  Respiratory: Negative for cough and shortness of breath.   Cardiovascular: Negative for chest pain.  Gastrointestinal: Negative for abdominal pain and blood in stool.  Musculoskeletal: Positive for myalgias and arthralgias. Negative for back pain.  Skin: Negative for rash.  Allergic/Immunologic: Negative for immunocompromised state.  Neurological: Positive for dizziness.  Hematological: Negative for adenopathy. Does not bruise/bleed easily.  Psychiatric/Behavioral: Negative for suicidal ideas and dysphoric mood.  GAD-7: score of 12. 1-4,5. 2 to all other     Objective:   Physical Exam  Constitutional: She is oriented to person, place, and time. She appears well-developed and well-nourished. No distress.  HENT:  Head: Normocephalic and atraumatic.    Cardiovascular: Normal rate, regular rhythm, normal heart sounds and intact  distal pulses.   Pulmonary/Chest: Effort normal and breath sounds normal.  Musculoskeletal: She exhibits no edema.  Neurological: She is alert and oriented to person, place, and time.  Skin: Skin is warm and dry. No rash noted.     Psychiatric: She has a normal mood and affect.  Ht 5\' 5"  (1.651 m)  Wt 296 lb (134.265 kg)  BMI 49.26 kg/m2  Lab Results  Component Value Date   HGBA1C 11.9* 07/23/2014  CBG 174   Depression screen [facility] 2/9 09/15/2014 08/03/2014  Decreased Interest 1 1  Down, Depressed, Hopeless 2 2  PHQ - 2 Score 3 3  Altered sleeping 2 2  Tired, decreased energy 2 3  Change in appetite 1 2  Feeling bad or failure about yourself  2 2  Trouble concentrating 1 1  Moving slowly or fidgety/restless 1 2  Suicidal thoughts 1 1  PHQ-9 Score 13 16         Assessment & Plan:

## 2014-09-15 NOTE — Progress Notes (Signed)
Establish Care F/U DM  Glucose running lowest 93-hight 153 Hx tobacco 15 cigarette per day

## 2014-09-16 LAB — ANA: ANA: NEGATIVE

## 2014-09-16 LAB — JO-1 ANTIBODY-IGG: JO-1 ANTIBODY, IGG: NEGATIVE

## 2014-09-16 LAB — SJOGRENS SYNDROME-B EXTRACTABLE NUCLEAR ANTIBODY: SSB (La) (ENA) Antibody, IgG: <1

## 2014-09-16 LAB — ANTI-SCLERODERMA ANTIBODY: Scleroderma (Scl-70) (ENA) Antibody, IgG: <1

## 2014-09-16 LAB — SJOGRENS SYNDROME-A EXTRACTABLE NUCLEAR ANTIBODY: SSA (RO) (ENA) ANTIBODY, IGG: NEGATIVE

## 2014-09-16 LAB — MICROALBUMIN, URINE: MICROALB UR: 0.3 mg/dL (ref <2.0)

## 2014-09-16 LAB — HIV ANTIBODY (ROUTINE TESTING W REFLEX): HIV: NONREACTIVE

## 2014-09-16 LAB — CYCLIC CITRUL PEPTIDE ANTIBODY, IGG: Cyclic Citrullin Peptide Ab: 3.8 U/mL (ref 0.0–5.0)

## 2014-09-19 LAB — ALDOLASE: Aldolase: 6.2 U/L (ref <=8.1)

## 2014-09-20 LAB — RHEUMATOID FACTOR: Rhuematoid fact SerPl-aCnc: <10 IU/mL (ref <=14)

## 2014-09-21 LAB — CK TOTAL AND CKMB (NOT AT ARMC)

## 2014-09-28 ENCOUNTER — Ambulatory Visit (INDEPENDENT_AMBULATORY_CARE_PROVIDER_SITE_OTHER): Payer: Self-pay | Admitting: Pulmonary Disease

## 2014-09-28 ENCOUNTER — Other Ambulatory Visit: Payer: Self-pay

## 2014-09-28 ENCOUNTER — Encounter: Payer: Self-pay | Admitting: Pulmonary Disease

## 2014-09-28 VITALS — BP 118/60 | HR 65 | Ht 65.0 in | Wt 294.6 lb

## 2014-09-28 DIAGNOSIS — J849 Interstitial pulmonary disease, unspecified: Secondary | ICD-10-CM

## 2014-09-28 DIAGNOSIS — F4000 Agoraphobia, unspecified: Secondary | ICD-10-CM | POA: Insufficient documentation

## 2014-09-28 DIAGNOSIS — F17200 Nicotine dependence, unspecified, uncomplicated: Secondary | ICD-10-CM

## 2014-09-28 DIAGNOSIS — Z720 Tobacco use: Secondary | ICD-10-CM

## 2014-09-28 DIAGNOSIS — K219 Gastro-esophageal reflux disease without esophagitis: Secondary | ICD-10-CM

## 2014-09-28 DIAGNOSIS — F411 Generalized anxiety disorder: Secondary | ICD-10-CM | POA: Insufficient documentation

## 2014-09-28 DIAGNOSIS — G4730 Sleep apnea, unspecified: Secondary | ICD-10-CM

## 2014-09-28 MED ORDER — PANTOPRAZOLE SODIUM 40 MG PO TBEC: 40.0000 mg | DELAYED_RELEASE_TABLET | Freq: Every day | ORAL | Status: DC

## 2014-09-28 NOTE — Patient Instructions (Signed)
1.  We are obtaining records from your lung doctor in [place]. 2. We are going to do breathing tests before your next appointment to see how you're lungs are functioning. 3. I'm checking a CT scan of your chest to better characterize your pulmonary fibrosis. 4. Please try to avoid eating within 3 hours of bedtime as well as chocolate, caffeine, carbonated beverages, and acidic foods which could worsen her reflux. 5. We are starting you back on her Protonix today. 6. Please try to taper your use of tobacco as you start Chantix as this could be causing your lung disease. 7. You will return to clinic in 4 weeks but please contact my office if you have any further questions or concerns.

## 2014-09-28 NOTE — Progress Notes (Signed)
Subjective:    Patient ID: Joanna [name], female    DOB: Jun 17, 1970, 44 y.o.   MRN: [id number]  HPI She reports she was diagnosed in 2011 by a pulmonologist in [place], [place]. She presented to the ED with dyspnea and was found to have abnormal imaging. She was subsequently referred to a local pulmonologist.  She reports she has never had a lung biopsy. She reports she was never told what could be causing her lung disease. She reports she continues to have dyspnea on exertion. She reports she was able to walk from the parking lot into her office but did have significant dyspnea even after riding the elevator. She reports her last home had 15 steps in her last home but had to climb them in stages and stop intermittently due to pain in her legs due to neuropathy and arthritis. She reports an intermittent cough that doesn't occur on a daily basis. She does find she coughs more in the morning and it's productive of a white mucus. She denies any hemoptysis. She does have occasional wheezing. She reports she does have tightness and pain in her chest that happens intermittently. She is not clear on whether it is coming from her underlying panic disorder. She reports she does occasionally have pleurisy. She reports this discomfort resolves with a nebulizer treatment. She has only been prescribed a rescue inhaler in the past (Ventolin). She denies any rashes but does bruise easily. She reports chronic pain in her knees, ankles, shoulders, & wrists. She denies any joint swelling or erythema. She reports pain in her hands with repetitive movement as well as stiffness. She denies any dry eyes. She denies any oral ulcers but does have dry mouth. No fever, chills, or sweats.  No Raynaud's. Reports she was previously diagnosed with sleep apnea but never started on CPAP.  Review of Systems She has reflux at least a couple of times a week. Previously was on Protonix but doesn't take anything recently. She does have morning brash  water taste. No dysphagia or odynophagia. No nausea or emesis. She does have frequent diarrhea that she attributes to IBS. No dysuria or hematuria. A pertinent 14 point review of systems is negative except as per the history of presenting illness.  Allergies  Allergen Reactions  . Wellbutrin [Bupropion] Rash   Current Outpatient Prescriptions on File Prior to Visit  Medication Sig Dispense Refill  . amLODipine (NORVASC) 2.5 MG tablet Take 1 tablet (2.5 mg total) by mouth daily. 30 tablet 11  . aspirin EC 81 MG tablet Take 1 tablet (81 mg total) by mouth daily. 30 tablet 11  . citalopram (CELEXA) 20 MG tablet Take 1 tablet (20 mg total) by mouth daily. 30 tablet 2  . levonorgestrel (MIRENA) 20 MCG/24HR IUD 1 each by Intrauterine route once.     Marland Kitchen lisinopril (PRINIVIL,ZESTRIL) 20 MG tablet Take 1 tablet (20 mg total) by mouth daily. 30 tablet 6  . metFORMIN (GLUCOPHAGE) 1000 MG tablet Take 1 tablet (1,000 mg total) by mouth 2 (two) times daily with a meal. 60 tablet 5  . metoprolol (LOPRESSOR) 50 MG tablet Take 1 tablet (50 mg total) by mouth 2 (two) times daily. 60 tablet 6  . nitroGLYCERIN (NITROSTAT) 0.4 MG SL tablet Place 1 tablet (0.4 mg total) under the tongue every 5 (five) minutes as needed for chest pain (up to 3 doses). 25 tablet 3  . pravastatin (PRAVACHOL) 80 MG tablet Take 1 tablet (80 mg total) by mouth every evening.  30 tablet 6  . ticagrelor (BRILINTA) 90 MG TABS tablet Take 1 tablet (90 mg total) by mouth 2 (two) times daily. 60 tablet 1  . Blood Glucose Monitoring Suppl (TRUE METRIX METER) W/DEVICE KIT 1 each by Does not apply route 3 (three) times daily. 1 kit 0  . glucose blood (TRUE METRIX BLOOD GLUCOSE TEST) test strip Use as instructed 100 each 12  . pregabalin (LYRICA) 75 MG capsule Take 1 capsule (75 mg total) by mouth 2 (two) times daily. (Patient not taking: Reported on 09/28/2014) 60 capsule 2  . TRUEPLUS LANCETS 28G MISC 1 each by Does not apply route 3 (three) times  daily. 100 each 5  . varenicline (CHANTIX CONTINUING MONTH PAK) 1 MG tablet Take 1 tablet (1 mg total) by mouth 2 (two) times daily. (Patient not taking: Reported on 09/28/2014) 60 tablet 2  . varenicline (CHANTIX STARTING MONTH PAK) 0.5 MG X 11 & 1 MG X 42 tablet Taper per packet insert (Patient not taking: Reported on 09/28/2014) 53 tablet 0   No current facility-administered medications on file prior to visit.   Past Medical History  Diagnosis Date  . Diabetes type 2, uncontrolled Dx 2010  . Morbid obesity   . Non compliance w medication regimen   . STEMI (ST elevation myocardial infarction) 07/23/14  . Tobacco abuse   . CAD (coronary artery disease)     a. Inferior STEMI 07/2014 - s/p thrombectomy of RCA, no stenting.  . Depression     at age 21  . Anxiety     Dx at age 29  . Hypertension     Dx at age 30  . Lung disorder Dx 2011  . Hyperlipidemia Dx 2010  . Diabetes mellitus without complication   . PCOS (polycystic ovarian syndrome) 1988    no children   . Interstitial lung disease Dx 2011    No biopsy  . IBS (irritable bowel syndrome)   . Gout    Past Surgical History  Procedure Laterality Date  . Cardiac catheterization N/A 07/23/2014    Procedure: Left Heart Cath and Coronary Angiography;  Surgeon: Peter M Martinique, MD;  Location: [facility];  Service: Cardiovascular;  Laterality: N/A;  . Cardiac catheterization N/A 07/27/2014    Procedure: Left Heart Cath and Coronary Angiography;  Surgeon: Jettie Booze, MD;  Location: [facility];  Service: Cardiovascular;  Laterality: N/A;  . Cardiac catheterization N/A 07/27/2014    Procedure: Coronary Balloon Angioplasty;  Surgeon: Jettie Booze, MD;  Location: [facility];  Service: Cardiovascular;  Laterality: N/A;  thrombectomy only   Family History  Problem Relation Age of Onset  . Coronary artery disease Father 84    CABG  . Lung cancer Father   . Heart disease Father   . Diabetes Father   .  Lung cancer Mother   . Diabetes Sister   . Thyroid disease Sister   . Liver disease Brother     alcoholic cirrhosis >  HCC  . COPD Brother   . Asthma Brother   . Cirrhosis Maternal Aunt   . Cirrhosis Maternal Uncle   . Breast cancer Paternal Aunt   . Cancer Maternal Grandmother   . Emphysema Maternal Grandfather   . COPD Paternal Grandmother   . Rheumatologic disease Neg Hx    Social History   Social History  . Marital Status: Single    Spouse Name: N/A  . Number of Children: 0  .  Years of Education: N/A   Occupational History  . unemployed    Social History Main Topics  . Smoking status: Current Every Day Smoker -- 0.50 packs/day for 31 years    Types: Cigarettes    Start date: 08/04/1983  . Smokeless tobacco: Never Used     Comment: about 15 cigs daily 09/28/14 - peak 2ppd  . Alcohol Use: No  . Drug Use: No  . Sexual Activity: Not Asked   Other Topics Concern  . None   Social History Narrative   Originally from [place], [place]. She has only lived in [place]. She moved to [facility] in 30-Apr-2014 after her brother died. Currently lives with her sister. She has traveled to [place], [place], [place], [place], [place], [place]. Previously has worked as a Corporate investment banker, in [employer], & also in Data processing manager. She has not been able to keep a job because of her panic attacks. No international travel. Currently has a dog & cat in her residence. No bird, mold, or hot tub exposure. No asbestos exposure.          Objective:   Physical Exam Blood pressure 118/60, pulse 65, height 5' 5"  (1.651 m), weight 294 lb 9.6 oz (133.63 kg), SpO2 97 %. General:  Awake. Alert. No acute distress. Obese Caucasian female..  Integument:  Warm & dry. No rash on exposed skin. Tattoo noted on left wrist. Skin lichenification noted on bilateral lower extremities. Lymphatics:  No appreciated cervical or supraclavicular lymphadenoapthy. HEENT:  Moist mucus membranes. No oral ulcers. No scleral injection or icterus.  PERRL. Cardiovascular:  Regular rate. Regular rhythm. No appreciable JVD given body habitus.  Pulmonary:  Good aeration & clear to auscultation bilaterally. Symmetric chest wall expansion. No accessory muscle use. Abdomen: Soft. Normal bowel sounds. Protuberant. Grossly nontender. Musculoskeletal:  Normal bulk and tone. Hand grip strength 5/5 bilaterally. No joint deformity or effusion appreciated. Neurological:  CN 2-12 grossly in tact. No meningismus. Moving all 4 extremities equally. Symmetric patellar deep tendon reflexes. Psychiatric:  Mood and affect congruent and somewhat flat. Speech normal rhythm, rate & tone.   IMAGING PORTABLE CXR 07/23/14 (personally reviewed by me): Poor quality film. Bilateral interstitial prominence. Unable to appreciate any focal opacity. No appreciable pleural effusion but again poor quality film. No obvious cardiomegaly.  CARDIAC TTE (07/24/14): LV not well visualized with normal cavity size. LVEF 55-60%. No regional wall motion abnormalities. Insufficient to evaluate diastolic function. LA mildly dilated. RA normal in size. RV size upper limits of normal with normal systolic function. Pulmonary artery pressure within normal range reportedly. Pulmonary artery normal in size. IVC normal in size. No pericardial effusion. No aortic stenosis or regurgitation. Trivial mitral regurgitation without stenosis. No pulmonic stenosis or regurgitation. No tricuspid regurgitation.  LABS 09/15/14 ANA: Negative Rheumatoid factor: 3.8 (Negative) Anti-CCP:  3.8 (negative) Jo 1 antibody:  <1.0 (negative) SSA:  Negative SSB:  Negative SCL-70:  Negative HIV:  Negative  09/03/14 CBC: 15.5/13.9/42.0/301 BMP: 137/3.6/102/26/9/0.78/117/8.9 LFT: 3.5/7.4/0.4/86/18/14    Assessment & Plan:  44 year old female with ongoing tobacco use diagnosed with interstitial lung disease in Apr 30, 2009 by her pulmonologist in [place]. The patient also reportedly was diagnosed with sleep apnea  but never started on home CPAP. I spent a significant amount time today discussing the patient's previous diagnosis and explaining that there are multitude of illnesses that can cause scarring within the lungs. Certainly her ongoing tobacco use puts her at risk for DS IP, RB ILD, and Langerhans cell histiocytosis.  Complicating the fact is her ongoing reflux which can contribute to potential flares of interstitial lung disease. I instructed the patient to contact my office if she had any further questions or concerns before her next appointment otherwise we will review her test results I acquire records from her previous office.  1. Interstitial lung disease: Obtaining records from prior pulmonologist. Checking high-resolution CT scan of the chest. Checking for pulmonary function testing with 6 minute walk test before next appointment. Checking hypersensitivity pneumonitis panel today. Deferring surgical lung biopsy at this time. 2. Ongoing tobacco use disorder: Spelled over 3 minutes counseling the patient will need to completely quit smoking tobacco. Previously prescribed Chantix and I today recommended tapering use of cigarettes. Previously had allergic reaction to nicotine patches and has known underlying coronary artery disease. 3. GERD: Restarting the patient on Protonix. Spent a significant amount time today educating the patient on dietary and lifestyle modifications. 4. Sleep apnea: Obtaining prior pulmonologist records from polysomnogram to determine whether or not we need to start CPAP therapy. 5. Follow-up: Patient to return to clinic in 4 weeks or sooner if needed.

## 2014-10-05 LAB — HYPERSENSITIVITY PNUEMONITIS PROFILE

## 2014-10-06 ENCOUNTER — Ambulatory Visit (INDEPENDENT_AMBULATORY_CARE_PROVIDER_SITE_OTHER)
Admission: RE | Admit: 2014-10-06 | Discharge: 2014-10-06 | Disposition: A | Discharge status: Home / Self Care | Payer: Self-pay | Source: Ambulatory Visit | Attending: Pulmonary Disease | Admitting: Pulmonary Disease

## 2014-10-06 DIAGNOSIS — J849 Interstitial pulmonary disease, unspecified: Secondary | ICD-10-CM

## 2014-10-07 ENCOUNTER — Telehealth: Payer: Self-pay | Admitting: Pulmonary Disease

## 2014-10-07 NOTE — Telephone Encounter (Signed)
Patient notified of results of HRCT. Recommended she try to completely quit smoking as this is the most likely cause for her findings.

## 2014-10-21 ENCOUNTER — Telehealth: Payer: Self-pay | Admitting: Pulmonary Disease

## 2014-10-21 NOTE — Telephone Encounter (Signed)
Received records from [facility] forwarded 6 pages to Dr. Tera Partridge 10/21/14 fbg.

## 2014-10-26 ENCOUNTER — Ambulatory Visit (INDEPENDENT_AMBULATORY_CARE_PROVIDER_SITE_OTHER): Payer: Self-pay | Admitting: Pulmonary Disease

## 2014-10-26 ENCOUNTER — Encounter: Payer: Self-pay | Admitting: Pulmonary Disease

## 2014-10-26 VITALS — BP 122/70 | HR 88 | Ht 65.5 in | Wt 293.0 lb

## 2014-10-26 DIAGNOSIS — Z23 Encounter for immunization: Secondary | ICD-10-CM

## 2014-10-26 DIAGNOSIS — G4733 Obstructive sleep apnea (adult) (pediatric): Secondary | ICD-10-CM

## 2014-10-26 DIAGNOSIS — F17200 Nicotine dependence, unspecified, uncomplicated: Secondary | ICD-10-CM

## 2014-10-26 DIAGNOSIS — J849 Interstitial pulmonary disease, unspecified: Secondary | ICD-10-CM

## 2014-10-26 DIAGNOSIS — K219 Gastro-esophageal reflux disease without esophagitis: Secondary | ICD-10-CM | POA: Insufficient documentation

## 2014-10-26 DIAGNOSIS — Z720 Tobacco use: Secondary | ICD-10-CM

## 2014-10-26 LAB — PULMONARY FUNCTION TEST
DL/VA % PRED: 125 %
DL/VA: 6.24 ml/min/mmHg/L
DLCO UNC % PRED: 99 %
DLCO unc: 26.23 ml/min/mmHg
FEF 25-75 POST: 3.29 L/s
FEF 25-75 PRE: 2.87 L/s
FEF2575-%CHANGE-POST: 14 %
FEF2575-%Pred-Post: 109 %
FEF2575-%Pred-Pre: 95 %
FEV1-%Change-Post: 4 %
FEV1-%Pred-Post: 85 %
FEV1-%Pred-Pre: 82 %
FEV1-PRE: 2.5 L
FEV1-Post: 2.61 L
FEV1FVC-%CHANGE-POST: 3 %
FEV1FVC-%Pred-Pre: 104 %
FEV6-%Change-Post: 0 %
FEV6-%PRED-POST: 79 %
FEV6-%Pred-Pre: 79 %
FEV6-Post: 2.96 L
FEV6-Pre: 2.95 L
FEV6FVC-%Pred-Post: 102 %
FEV6FVC-%Pred-Pre: 102 %
FVC-%Change-Post: 1 %
FVC-%Pred-Post: 78 %
FVC-%Pred-Pre: 77 %
FVC-Post: 2.99 L
FVC-Pre: 2.95 L
POST FEV1/FVC RATIO: 87 %
PRE FEV1/FVC RATIO: 84 %
Post FEV6/FVC ratio: 100 %
Pre FEV6/FVC Ratio: 100 %
RV % pred: 104 %
RV: 1.86 L
TLC % pred: 86 %
TLC: 4.59 L

## 2014-10-26 NOTE — Progress Notes (Signed)
PFT done today. 

## 2014-10-26 NOTE — Progress Notes (Signed)
Subjective:    Patient ID: Joanna [name], female    DOB: 08-29-1970, 44 y.o.   MRN: [id number]  C.C.:  Follow-up for ILD, OSA, GERD, & Ongoing Tobacco Use.  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 She reports dyspnea on exertion. Reports only a rare, early morning cough that produces a white mucus. No hemoptysis. No wheezing.  OSA:  Reports she has problems falling asleep. She reports "bouts of insomnia" that lasts 2-3 days. She feels like she is always fatigued. Frequent daytime naps. She has frequent headaches.  GERD: Restarted Protonix at last appointment. She reports her reflux has significant improved. No morning brash water taste.  Ongoing tobacco use: Previously attempted to quit using Chantix. Patient has had prior allergic reaction to nicotine patches and known underlying coronary artery disease. Recommended to try tapering cigarette use at last appointment. Hasn't restarted Chantix yet. Smoking 10-15 cigarettes daily.  Review of Systems No chest pain or pressure. No fever, chills, or sweats. No rashes or bruising.  Allergies  Allergen Reactions  . Wellbutrin [Bupropion] Rash   Current Outpatient Prescriptions on File Prior to Visit  Medication Sig Dispense Refill  . amLODipine (NORVASC) 2.5 MG tablet Take 1 tablet (2.5 mg total) by mouth daily. 30 tablet 11  . aspirin EC 81 MG tablet Take 1 tablet (81 mg total) by mouth daily. 30 tablet 11  . citalopram (CELEXA) 20 MG tablet Take 1 tablet (20 mg total) by mouth daily. 30 tablet 2  . levonorgestrel (MIRENA) 20 MCG/24HR IUD 1 each by Intrauterine route once.     Marland Kitchen lisinopril (PRINIVIL,ZESTRIL) 20 MG tablet Take 1 tablet (20 mg total) by mouth daily. 30 tablet 6  . metFORMIN (GLUCOPHAGE) 1000 MG tablet Take 1 tablet (1,000 mg total) by mouth 2 (two) times daily with a meal. 60 tablet 5  . metoprolol (LOPRESSOR) 50 MG tablet Take 1 tablet (50 mg total) by mouth 2 (two) times daily. 60  tablet 6  . nitroGLYCERIN (NITROSTAT) 0.4 MG SL tablet Place 1 tablet (0.4 mg total) under the tongue every 5 (five) minutes as needed for chest pain (up to 3 doses). 25 tablet 3  . pantoprazole (PROTONIX) 40 MG tablet Take 1 tablet (40 mg total) by mouth daily. 30 tablet 3  . pravastatin (PRAVACHOL) 80 MG tablet Take 1 tablet (80 mg total) by mouth every evening. 30 tablet 6  . ticagrelor (BRILINTA) 90 MG TABS tablet Take 1 tablet (90 mg total) by mouth 2 (two) times daily. 60 tablet 1  . Blood Glucose Monitoring Suppl (TRUE METRIX METER) W/DEVICE KIT 1 each by Does not apply route 3 (three) times daily. 1 kit 0  . glucose blood (TRUE METRIX BLOOD GLUCOSE TEST) test strip Use as instructed 100 each 12  . pregabalin (LYRICA) 75 MG capsule Take 1 capsule (75 mg total) by mouth 2 (two) times daily. (Patient not taking: Reported on 10/26/2014) 60 capsule 2  . TRUEPLUS LANCETS 28G MISC 1 each by Does not apply route 3 (three) times daily. 100 each 5  . varenicline (CHANTIX CONTINUING MONTH PAK) 1 MG tablet Take 1 tablet (1 mg total) by mouth 2 (two) times daily. (Patient not taking: Reported on 09/28/2014) 60 tablet 2  . varenicline (CHANTIX STARTING MONTH PAK) 0.5 MG X 11 & 1 MG X 42 tablet Taper per packet insert (Patient not taking: Reported on 09/28/2014) 53 tablet 0   No current facility-administered medications on file  prior to visit.   Past Medical History  Diagnosis Date  . Diabetes type 2, uncontrolled Dx 04/07/2008  . Morbid obesity   . Non compliance w medication regimen   . STEMI (ST elevation myocardial infarction) 07/23/14  . Tobacco abuse   . CAD (coronary artery disease)     a. Inferior STEMI 07/2014 - s/p thrombectomy of RCA, no stenting.  . Depression     at age 71  . Anxiety     Dx at age 78  . Hypertension     Dx at age 14  . Lung disorder Dx 04/07/2009  . Hyperlipidemia Dx 2008/04/07  . Diabetes mellitus without complication   . PCOS (polycystic ovarian syndrome) 1988    no children   .  Interstitial lung disease Dx 07-Apr-2009    No biopsy  . IBS (irritable bowel syndrome)   . Gout   . Sleep apnea    Past Surgical History  Procedure Laterality Date  . Cardiac catheterization N/A 07/23/2014    Procedure: Left Heart Cath and Coronary Angiography;  Surgeon: Peter M Martinique, MD;  Location: [facility];  Service: Cardiovascular;  Laterality: N/A;  . Cardiac catheterization N/A 07/27/2014    Procedure: Left Heart Cath and Coronary Angiography;  Surgeon: Jettie Booze, MD;  Location: [facility];  Service: Cardiovascular;  Laterality: N/A;  . Cardiac catheterization N/A 07/27/2014    Procedure: Coronary Balloon Angioplasty;  Surgeon: Jettie Booze, MD;  Location: [facility];  Service: Cardiovascular;  Laterality: N/A;  thrombectomy only   Family History  Problem Relation Age of Onset  . Coronary artery disease Father 51    CABG  . Lung cancer Father   . Heart disease Father   . Diabetes Father   . Lung cancer Mother   . Diabetes Sister   . Thyroid disease Sister   . Liver disease Brother     alcoholic cirrhosis >  HCC  . COPD Brother   . Asthma Brother   . Cirrhosis Maternal Aunt   . Cirrhosis Maternal Uncle   . Breast cancer Paternal Aunt   . Cancer Maternal Grandmother   . Emphysema Maternal Grandfather   . COPD Paternal Grandmother   . Rheumatologic disease Neg Hx    Social History   Social History  . Marital Status: Single    Spouse Name: N/A  . Number of Children: 0  . Years of Education: N/A   Occupational History  . unemployed    Social History Main Topics  . Smoking status: Current Every Day Smoker -- 0.50 packs/day for 31 years    Types: Cigarettes    Start date: 08/04/1983  . Smokeless tobacco: Never Used     Comment: about 10-15 cigs daily 09/28/14 - peak 2ppd  . Alcohol Use: No  . Drug Use: No  . Sexual Activity: Not Asked   Other Topics Concern  . None   Social History Narrative   Originally from [place], [place].  She has only lived in [place]. She moved to [facility] in 04/07/2014 after her brother died. Currently lives with her sister. She has traveled to [place], [place], [place], [place], [place], [place]. Previously has worked as a Corporate investment banker, in [employer], & also in Data processing manager. She has not been able to keep a job because of her panic attacks. No international travel. Currently has a dog & cat in her residence. No bird, mold, or hot tub exposure.  No asbestos exposure.          Objective:   Physical Exam Blood pressure 122/70, pulse 88, height 5' 5.5" (1.664 m), weight 293 lb (132.904 kg), SpO2 95 %. General:  Awake. Alert. No acute distress. Obese Caucasian female..  Integument:  Warm & dry. No rash or bruising on exposed skin. Tattoo noted on left wrist.  HEENT:  Moist mucus membranes. No oral ulcers. Minimal bilateral nasal turbinate swelling Cardiovascular: Regular  rhythm. trace lower extremity edema.  Pulmonary:  Good aeration & clear to auscultation bilaterally. Symmetric chest wall expansion.  Abdomen: Soft. Normal bowel sounds. Nontender. Musculoskeletal:  Normal bulk and tone. Hand grip strength 5/5 bilaterally. No joint deformity or effusion appreciated.  PFT 10/26/14: FVC 2.95 L (77%) FEV1 2.50 L (82%) FEV1/FVC 0.84 FEF 25-75 2.87 L (95%) no bronchodilator response TLC 4.59 L (86%) RV 104% ERV 40% DLCO uncorrected 99%  6MWT 10/26/14: Walked 274 meters / Baseline Sat 97% on RA / Nadir Sat 98% on RA (c/o Leg & knee pain w/o pause)  IMAGING HRCT CHEST W/O 10/06/14 (personally reviewed by me): No pathologic mediastinal adenopathy or mediastinal mass. No pericardial effusion. No pleural effusion or thickening. Patchy groundglass throughout both lungs with mild bronchial wall thickening. Centrilobular micronodules there and he noted. No honeycombing changes or intralobular septal thickening. Subcentimeter areas of calcification consistent with prior granulomatous disease. Pattern favors DIP.  PORTABLE CXR 07/23/14  (previously reviewed by me): Poor quality film. Bilateral interstitial prominence. Unable to appreciate any focal opacity. No appreciable pleural effusion but again poor quality film. No obvious cardiomegaly.  CARDIAC TTE (07/24/14): LV not well visualized with normal cavity size. LVEF 55-60%. No regional wall motion abnormalities. Insufficient to evaluate diastolic function. LA mildly dilated. RA normal in size. RV size upper limits of normal with normal systolic function. Pulmonary artery pressure within normal range reportedly. Pulmonary artery normal in size. IVC normal in size. No pericardial effusion. No aortic stenosis or regurgitation. Trivial mitral regurgitation without stenosis. No pulmonic stenosis or regurgitation. No tricuspid regurgitation.  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 I reviewed this with the patient today. Her prior autoimmune workup has been negative and given this pattern on CT imaging I do not feel immunosuppression is necessary at this time. I reviewed her pulmonary function testing today which shows no evidence of obstruction with normal lung volumes and carbon monoxide diffusion capacity. Additionally, despite her report of some mild dyspnea with exertion for 6 minute walk test shows no evidence of significant desaturation that would indicate the need for oxygen therapy. She continues to have ongoing issues with insomnia and probable underlying OSA but I do not have the records from her prior pulmonologist office to review at this time. We did  spend a significant amount of time today discussing her need for complete tobacco cessation giving her underlying ILD. I instructed the patient to notify my office if she had any new breathing problems before next appointment. She will continue on GERD as this seems to significantly help control her underlying reflux.  1. ILD/DIP: Repeat spirometry with DLCO and 6 been walk test at next appointment. 2.  OSA: Awaiting results from outside pulmonologist for review. Patient may need repeat polysomnogram/referral to one of my partners who deals more with insomnia and sleep related breathing disorders. 3. GERD: Well controlled. Continuing on Protonix.  4. Ongoing tobacco use: Spelled over 3 minutes today counseling the patient will need for complete tobacco cessation. She is going to start Chantix once she is able to afford it. 5. Health maintenance: Administering Pneumovax today. Recommended obtaining influenza vaccine and 3-4 weeks. 6. Follow-up: Patient return to clinic in 3 months or sooner if needed.

## 2014-10-26 NOTE — Patient Instructions (Signed)
1. Try to continue to wean herself off of cigarette use completely as this is causing her lung disease. 2. Remember to get sure influenza vaccine and 3-4 weeks. 3. Please contact my office if you have any new breathing problems before your next appointment. 4. You will return to clinic in 3 months but please call me if he needs to be seen sooner.

## 2014-10-26 NOTE — Addendum Note (Signed)
Addended by: Inge Rise on: 10/26/2014 03:12 PM   Modules accepted: Orders

## 2014-10-26 NOTE — Progress Notes (Signed)
PFT 10/26/14: FVC 2.95 L (77%) FEV1 2.50 L (82%) FEV1/FVC 0.84 FEF 25-75 2.87 L (95%) no bronchodilator response TLC 4.59 L (86%) RV 104% ERV 40% DLCO uncorrected 99%  6MWT 10/26/14:  Walked 274 meters / Baseline Sat 97% on RA / Nadir Sat 98% on RA (c/o Leg & knee pain w/o pause)

## 2014-10-30 ENCOUNTER — Emergency Department (HOSPITAL_COMMUNITY)
Admission: EM | Admit: 2014-10-30 | Discharge: 2014-10-30 | Disposition: A | Discharge status: Home / Self Care | Payer: Medicaid Other | Attending: Emergency Medicine | Admitting: Emergency Medicine

## 2014-10-30 ENCOUNTER — Encounter (HOSPITAL_COMMUNITY): Payer: Self-pay | Admitting: Emergency Medicine

## 2014-10-30 ENCOUNTER — Emergency Department (HOSPITAL_COMMUNITY): Payer: Medicaid Other

## 2014-10-30 DIAGNOSIS — Z79899 Other long term (current) drug therapy: Secondary | ICD-10-CM | POA: Diagnosis not present

## 2014-10-30 DIAGNOSIS — F419 Anxiety disorder, unspecified: Secondary | ICD-10-CM | POA: Diagnosis not present

## 2014-10-30 DIAGNOSIS — I252 Old myocardial infarction: Secondary | ICD-10-CM | POA: Diagnosis not present

## 2014-10-30 DIAGNOSIS — I2510 Atherosclerotic heart disease of native coronary artery without angina pectoris: Secondary | ICD-10-CM | POA: Diagnosis not present

## 2014-10-30 DIAGNOSIS — Z8669 Personal history of other diseases of the nervous system and sense organs: Secondary | ICD-10-CM | POA: Insufficient documentation

## 2014-10-30 DIAGNOSIS — R079 Chest pain, unspecified: Secondary | ICD-10-CM | POA: Insufficient documentation

## 2014-10-30 DIAGNOSIS — Z7982 Long term (current) use of aspirin: Secondary | ICD-10-CM | POA: Insufficient documentation

## 2014-10-30 DIAGNOSIS — Z8709 Personal history of other diseases of the respiratory system: Secondary | ICD-10-CM | POA: Insufficient documentation

## 2014-10-30 DIAGNOSIS — F329 Major depressive disorder, single episode, unspecified: Secondary | ICD-10-CM | POA: Diagnosis not present

## 2014-10-30 DIAGNOSIS — Z720 Tobacco use: Secondary | ICD-10-CM | POA: Insufficient documentation

## 2014-10-30 DIAGNOSIS — Z9114 Patient's other noncompliance with medication regimen: Secondary | ICD-10-CM | POA: Insufficient documentation

## 2014-10-30 DIAGNOSIS — Z8719 Personal history of other diseases of the digestive system: Secondary | ICD-10-CM | POA: Insufficient documentation

## 2014-10-30 DIAGNOSIS — I10 Essential (primary) hypertension: Secondary | ICD-10-CM | POA: Insufficient documentation

## 2014-10-30 DIAGNOSIS — Z7902 Long term (current) use of antithrombotics/antiplatelets: Secondary | ICD-10-CM | POA: Insufficient documentation

## 2014-10-30 DIAGNOSIS — Z9889 Other specified postprocedural states: Secondary | ICD-10-CM | POA: Diagnosis not present

## 2014-10-30 DIAGNOSIS — E785 Hyperlipidemia, unspecified: Secondary | ICD-10-CM | POA: Diagnosis not present

## 2014-10-30 DIAGNOSIS — E119 Type 2 diabetes mellitus without complications: Secondary | ICD-10-CM | POA: Diagnosis not present

## 2014-10-30 DIAGNOSIS — M25511 Pain in right shoulder: Secondary | ICD-10-CM | POA: Diagnosis not present

## 2014-10-30 LAB — BASIC METABOLIC PANEL
Anion gap: 6 (ref 5–15)
BUN: 13 mg/dL (ref 6–20)
CO2: 27 mmol/L (ref 22–32)
Calcium: 8.5 mg/dL — ABNORMAL LOW (ref 8.9–10.3)
Chloride: 103 mmol/L (ref 101–111)
Creatinine, Ser: 0.78 mg/dL (ref 0.44–1.00)
GFR calc Af Amer: >60 mL/min (ref >60)
GFR calc non Af Amer: >60 mL/min (ref >60)
Glucose, Bld: 136 mg/dL — ABNORMAL HIGH (ref 65–99)
Potassium: 4.1 mmol/L (ref 3.5–5.1)
Sodium: 136 mmol/L (ref 135–145)

## 2014-10-30 LAB — CBC
HCT: 42.1 % (ref 36.0–46.0)
Hemoglobin: 13.7 g/dL (ref 12.0–15.0)
MCH: 27.2 pg (ref 26.0–34.0)
MCHC: 32.5 g/dL (ref 30.0–36.0)
MCV: 83.5 fL (ref 78.0–100.0)
Platelets: 308 10*3/uL (ref 150–400)
RBC: 5.04 MIL/uL (ref 3.87–5.11)
RDW: 15.2 % (ref 11.5–15.5)
WBC: 14 10*3/uL — ABNORMAL HIGH (ref 4.0–10.5)

## 2014-10-30 LAB — TROPONIN I: Troponin I: <0.03 ng/mL (ref <0.031)

## 2014-10-30 MED ORDER — MORPHINE SULFATE (PF) 4 MG/ML IV SOLN
6.0000 mg | Freq: Once | INTRAVENOUS | Status: AC
  Administered 2014-10-30: 6 mg via INTRAVENOUS
  Filled 2014-10-30: qty 2

## 2014-10-30 MED ORDER — KETOROLAC TROMETHAMINE 30 MG/ML IJ SOLN
15.0000 mg | Freq: Once | INTRAMUSCULAR | Status: AC
Start: 2014-10-30 — End: 2014-10-30
  Administered 2014-10-30: 15 mg via INTRAVENOUS
  Filled 2014-10-30: qty 1

## 2014-10-30 NOTE — ED Notes (Signed)
Patient complaining of right sided chest pain radiating down right arm and into right shoulder starting at 2200 last night. Per EMS, patient took 2 nitro with some relief and 4 baby aspirin prior to arrival to ED.

## 2014-10-30 NOTE — ED Provider Notes (Signed)
CSN: [id number]     Arrival date & time 10/30/14  0531 History   First MD Initiated Contact with Patient 10/30/14 0544     Chief Complaint  Patient presents with  . Chest Pain     (Consider location/radiation/quality/duration/timing/severity/associated sxs/prior Treatment) HPI   44y female with right shoulder/right upper chest pain. Onset around 2200 yesterday. Has been persistent since then. Pain is in the right shoulder, right upper/lateral chest and into the right axilla. No appreciable exacerbating or relieving factors. Describes the pain as a "ache." No other associated symptoms such as dyspnea, nausea or diaphoresis. Patient has a past history of coronary artery disease. She had inferior MI in June with thrombectomy of RCA. She states that current symptoms feel different. She has been doing well since his hospitalization. She reports compliance with her medications. No unusual leg pain or swelling. No fevers or chills. No new cough.  Past Medical History  Diagnosis Date  . Diabetes type 2, uncontrolled Dx 2010  . Morbid obesity   . Non compliance w medication regimen   . STEMI (ST elevation myocardial infarction) 07/23/14  . Tobacco abuse   . CAD (coronary artery disease)     a. Inferior STEMI 07/2014 - s/p thrombectomy of RCA, no stenting.  . Depression     at age 44  . Anxiety     Dx at age 44  . Hypertension     Dx at age 44  . Lung disorder Dx 2011  . Hyperlipidemia Dx 2010  . Diabetes mellitus without complication   . PCOS (polycystic ovarian syndrome) 1988    no children   . Interstitial lung disease Dx 2011    No biopsy  . IBS (irritable bowel syndrome)   . Gout   . Sleep apnea    Past Surgical History  Procedure Laterality Date  . Cardiac catheterization N/A 07/23/2014    Procedure: Left Heart Cath and Coronary Angiography;  Surgeon: Peter M Martinique, MD;  Location: [facility];  Service: Cardiovascular;  Laterality: N/A;  . Cardiac catheterization N/A  07/27/2014    Procedure: Left Heart Cath and Coronary Angiography;  Surgeon: Jettie Booze, MD;  Location: [facility];  Service: Cardiovascular;  Laterality: N/A;  . Cardiac catheterization N/A 07/27/2014    Procedure: Coronary Balloon Angioplasty;  Surgeon: Jettie Booze, MD;  Location: [facility];  Service: Cardiovascular;  Laterality: N/A;  thrombectomy only   Family History  Problem Relation Age of Onset  . Coronary artery disease Father 59    CABG  . Lung cancer Father   . Heart disease Father   . Diabetes Father   . Lung cancer Mother   . Diabetes Sister   . Thyroid disease Sister   . Liver disease Brother     alcoholic cirrhosis >  HCC  . COPD Brother   . Asthma Brother   . Cirrhosis Maternal Aunt   . Cirrhosis Maternal Uncle   . Breast cancer Paternal Aunt   . Cancer Maternal Grandmother   . Emphysema Maternal Grandfather   . COPD Paternal Grandmother   . Rheumatologic disease Neg Hx    Social History  Substance Use Topics  . Smoking status: Current Every Day Smoker -- 0.50 packs/day for 31 years    Types: Cigarettes    Start date: 08/04/1983  . Smokeless tobacco: Never Used     Comment: about 10-15 cigs daily 09/28/14 - peak 2ppd  . Alcohol Use: No  OB History    No data available     Review of Systems  All systems reviewed and negative, other than as noted in HPI.   Allergies  Wellbutrin  Home Medications   Prior to Admission medications   Medication Sig Start Date End Date Taking? Authorizing Provider  amLODipine (NORVASC) 2.5 MG tablet Take 1 tablet (2.5 mg total) by mouth daily. 08/04/14   Lendon Colonel, NP  aspirin EC 81 MG tablet Take 1 tablet (81 mg total) by mouth daily. 07/28/14   Dayna N Dunn, PA-C  Blood Glucose Monitoring Suppl (TRUE METRIX METER) W/DEVICE KIT 1 each by Does not apply route 3 (three) times daily. 08/03/14   Arnoldo Morale, MD  citalopram (CELEXA) 20 MG tablet Take 1 tablet (20 mg total) by mouth  daily. 09/15/14   Josalyn Funches, MD  glucose blood (TRUE METRIX BLOOD GLUCOSE TEST) test strip Use as instructed 08/03/14   Arnoldo Morale, MD  levonorgestrel (MIRENA) 20 MCG/24HR IUD 1 each by Intrauterine route once.  08/28/12   Historical Provider, MD  lisinopril (PRINIVIL,ZESTRIL) 20 MG tablet Take 1 tablet (20 mg total) by mouth daily. 07/28/14   Dayna N Dunn, PA-C  metFORMIN (GLUCOPHAGE) 1000 MG tablet Take 1 tablet (1,000 mg total) by mouth 2 (two) times daily with a meal. 09/15/14   Boykin Nearing, MD  metoprolol (LOPRESSOR) 50 MG tablet Take 1 tablet (50 mg total) by mouth 2 (two) times daily. 07/28/14   Dayna N Dunn, PA-C  nitroGLYCERIN (NITROSTAT) 0.4 MG SL tablet Place 1 tablet (0.4 mg total) under the tongue every 5 (five) minutes as needed for chest pain (up to 3 doses). 07/28/14   Dayna N Dunn, PA-C  pantoprazole (PROTONIX) 40 MG tablet Take 1 tablet (40 mg total) by mouth daily. 09/28/14   Javier Glazier, MD  pravastatin (PRAVACHOL) 80 MG tablet Take 1 tablet (80 mg total) by mouth every evening. 07/28/14   Dayna N Dunn, PA-C  pregabalin (LYRICA) 75 MG capsule Take 1 capsule (75 mg total) by mouth 2 (two) times daily. Patient not taking: Reported on 10/26/2014 09/15/14   Boykin Nearing, MD  ticagrelor (BRILINTA) 90 MG TABS tablet Take 1 tablet (90 mg total) by mouth 2 (two) times daily. 07/28/14   Dayna N Dunn, PA-C  TRUEPLUS LANCETS 28G MISC 1 each by Does not apply route 3 (three) times daily. 09/03/14   Arnoldo Morale, MD  varenicline (CHANTIX CONTINUING MONTH PAK) 1 MG tablet Take 1 tablet (1 mg total) by mouth 2 (two) times daily. Patient not taking: Reported on 09/28/2014 09/15/14   Boykin Nearing, MD  varenicline (CHANTIX STARTING MONTH PAK) 0.5 MG X 11 & 1 MG X 42 tablet Taper per packet insert Patient not taking: Reported on 09/28/2014 09/15/14   Josalyn Funches, MD   BP 121/53 mmHg  Pulse 74  Temp(Src) 98.1 F (36.7 C) (Oral)  Resp 20  Ht 5' 5"  (1.651 m)  Wt 293 lb (132.904 kg)  BMI  48.76 kg/m2  SpO2 97% Physical Exam  Constitutional: She appears well-developed and well-nourished. No distress.  Laying in bed. No acute distress. Obese.  HENT:  Head: Normocephalic and atraumatic.  Eyes: Conjunctivae are normal. Right eye exhibits no discharge. Left eye exhibits no discharge.  Neck: Neck supple.  Cardiovascular: Normal rate, regular rhythm and normal heart sounds.  Exam reveals no gallop and no friction rub.   No murmur heard. Pulmonary/Chest: Effort normal and breath sounds normal. No respiratory distress.  Abdominal:  Soft. She exhibits no distension. There is no tenderness.  Musculoskeletal: She exhibits no edema or tenderness.  Lower extremities symmetric as compared to each other. No calf tenderness. Negative Homan's. No palpable cords.   Neurological: She is alert.  Skin: Skin is warm and dry.  Psychiatric: She has a normal mood and affect. Her behavior is normal. Thought content normal.  Nursing note and vitals reviewed.   ED Course  Procedures (including critical care time) Labs Review Labs Reviewed  BASIC METABOLIC PANEL - Abnormal; Notable for the following:    Glucose, Bld 136 (*)    Calcium 8.5 (*)    All other components within normal limits  CBC - Abnormal; Notable for the following:    WBC 14.0 (*)    All other components within normal limits  TROPONIN I    Imaging Review Dg Chest 2 View  10/30/2014   CLINICAL DATA:  Chest pain and shortness of breath this morning.  EXAM: CHEST  2 VIEW  COMPARISON:  07/13/2014  FINDINGS: Normal heart size and pulmonary vascularity. Persistent diffuse interstitial pattern to the lungs. Findings suggest interstitial lung disease of nonspecific etiology. This was previously evaluated with high-resolution CT chest on 10/06/2014. See additional report. No focal consolidation or airspace disease in the lungs. No blunting of costophrenic angles. No pneumothorax. Mediastinal contours appear intact.  IMPRESSION: Reticular  nodular interstitial pattern to the lungs consistent with chronic interstitial lung disease. Similar appearance to previous studies. No acute consolidation.   Electronically Signed   By: Lucienne Capers M.D.   On: 10/30/2014 06:30   I have personally reviewed and evaluated these images and lab results as part of my medical decision-making.   EKG Interpretation   Date/Time:  Friday October 30 2014 05:40:10 EDT Ventricular Rate:  77 PR Interval:  150 QRS Duration: 85 QT Interval:  404 QTC Calculation: 457 R Axis:   45 Text Interpretation:  Sinus rhythm Low voltage, precordial leads  Non-specific ST-t changes Confirmed by Wilson Singer  MD, STEPHEN ([id number]) on  10/30/2014 5:57:59 AM      MDM   Final diagnoses:  Right-sided chest pain    44 year old female with right shoulder/right upper chest pain. Known CAD. Atypical for ACS in that pain has been constant for over 8 hours at this point. Troponin at approximately 8 hours after symptom onset is normal. EKG does not look markedly changed from most recent prior EKG. She is afebrile, hemodynamically stable and has no increased work of breathing. Chest x-ray with chronic appearing changes. She has no acute respiratory complaints. Doubt pulmonary embolism, dissection or serious infection.    Virgel Manifold, MD 11/07/14 [id number]

## 2014-10-30 NOTE — Discharge Instructions (Signed)

## 2014-10-30 NOTE — ED Notes (Signed)
Patient is resting comfortably. 

## 2014-11-05 ENCOUNTER — Ambulatory Visit (INDEPENDENT_AMBULATORY_CARE_PROVIDER_SITE_OTHER): Payer: Self-pay | Admitting: Cardiovascular Disease

## 2014-11-05 ENCOUNTER — Encounter: Payer: Self-pay | Admitting: Cardiovascular Disease

## 2014-11-05 VITALS — BP 124/70 | HR 83 | Ht 65.0 in | Wt 293.3 lb

## 2014-11-05 DIAGNOSIS — Z87898 Personal history of other specified conditions: Secondary | ICD-10-CM

## 2014-11-05 DIAGNOSIS — I10 Essential (primary) hypertension: Secondary | ICD-10-CM

## 2014-11-05 DIAGNOSIS — Z9289 Personal history of other medical treatment: Secondary | ICD-10-CM

## 2014-11-05 DIAGNOSIS — I252 Old myocardial infarction: Secondary | ICD-10-CM

## 2014-11-05 DIAGNOSIS — I25118 Atherosclerotic heart disease of native coronary artery with other forms of angina pectoris: Secondary | ICD-10-CM

## 2014-11-05 DIAGNOSIS — E785 Hyperlipidemia, unspecified: Secondary | ICD-10-CM

## 2014-11-05 NOTE — Patient Instructions (Signed)

## 2014-11-05 NOTE — Progress Notes (Signed)
Patient ID: Joanna [name], female   DOB: 05/12/1970, 44 y.o.   MRN: [id number]      SUBJECTIVE: The patient is a 44 year old woman with a history of obesity, coronary artery disease with percutaneous coronary intervention for an inferior ST elevation myocardial infarction, hypertension, diabetes mellitus, hyperlipidemia , an obstructive sleep apnea. This is my first time meeting her.  On 07/27/14, a 90% proximal to mid RCA lesion was found for which she underwent successful percutaneous thrombectomy with a large degree of debris removal. LV EDP was elevated. A decision was made to continue dual antiplatelet therapy although no stent was placed.  This was a relook cath after undergoing coronary angiography on 6/16.  Echocardiogram on 07/24/14 demonstrated normal left ventricular systolic function and regional wall motion, LVEF 55-60%.  Lipids on 09/03/14 showed total cholesterol 165, triglycerides 196, HDL 22, LDL 104.  She has had to use SL nitroglycerin on three occasions, with chest pain being relieved after one tablet each time.  She was evaluated in the ED for right shoulder and right-sided chest pain recently as well.  She is gradually regaining her energy. Denies bleeding problems (hematochezia, melena, hematuria).   Review of Systems: As per "subjective", otherwise negative.  Allergies  Allergen Reactions  . Wellbutrin [Bupropion] Rash    Current Outpatient Prescriptions  Medication Sig Dispense Refill  . amLODipine (NORVASC) 2.5 MG tablet Take 1 tablet (2.5 mg total) by mouth daily. 30 tablet 11  . aspirin EC 81 MG tablet Take 1 tablet (81 mg total) by mouth daily. 30 tablet 11  . Blood Glucose Monitoring Suppl (TRUE METRIX METER) W/DEVICE KIT 1 each by Does not apply route 3 (three) times daily. 1 kit 0  . citalopram (CELEXA) 20 MG tablet Take 1 tablet (20 mg total) by mouth daily. 30 tablet 2  . glucose blood (TRUE METRIX BLOOD GLUCOSE TEST) test strip Use as instructed 100 each  12  . levonorgestrel (MIRENA) 20 MCG/24HR IUD 1 each by Intrauterine route once.     Marland Kitchen lisinopril (PRINIVIL,ZESTRIL) 20 MG tablet Take 1 tablet (20 mg total) by mouth daily. 30 tablet 6  . metFORMIN (GLUCOPHAGE) 1000 MG tablet Take 1 tablet (1,000 mg total) by mouth 2 (two) times daily with a meal. 60 tablet 5  . metoprolol (LOPRESSOR) 50 MG tablet Take 1 tablet (50 mg total) by mouth 2 (two) times daily. 60 tablet 6  . nitroGLYCERIN (NITROSTAT) 0.4 MG SL tablet Place 1 tablet (0.4 mg total) under the tongue every 5 (five) minutes as needed for chest pain (up to 3 doses). 25 tablet 3  . pantoprazole (PROTONIX) 40 MG tablet Take 1 tablet (40 mg total) by mouth daily. 30 tablet 3  . pravastatin (PRAVACHOL) 80 MG tablet Take 1 tablet (80 mg total) by mouth every evening. 30 tablet 6  . pregabalin (LYRICA) 75 MG capsule Take 1 capsule (75 mg total) by mouth 2 (two) times daily. 60 capsule 2  . ticagrelor (BRILINTA) 90 MG TABS tablet Take 1 tablet (90 mg total) by mouth 2 (two) times daily. 60 tablet 1  . TRUEPLUS LANCETS 28G MISC 1 each by Does not apply route 3 (three) times daily. 100 each 5  . varenicline (CHANTIX CONTINUING MONTH PAK) 1 MG tablet Take 1 tablet (1 mg total) by mouth 2 (two) times daily. 60 tablet 2  . varenicline (CHANTIX STARTING MONTH PAK) 0.5 MG X 11 & 1 MG X 42 tablet Taper per packet insert 53 tablet 0  No current facility-administered medications for this visit.    Past Medical History  Diagnosis Date  . Diabetes type 2, uncontrolled Dx 2008-04-18  . Morbid obesity   . Non compliance w medication regimen   . STEMI (ST elevation myocardial infarction) 07/23/14  . Tobacco abuse   . CAD (coronary artery disease)     a. Inferior STEMI 07/2014 - s/p thrombectomy of RCA, no stenting.  . Depression     at age 27  . Anxiety     Dx at age 6  . Hypertension     Dx at age 19  . Lung disorder Dx 2009/04/18  . Hyperlipidemia Dx Apr 18, 2008  . Diabetes mellitus without complication   . PCOS  (polycystic ovarian syndrome) 1988    no children   . Interstitial lung disease Dx Apr 18, 2009    No biopsy  . IBS (irritable bowel syndrome)   . Gout   . Sleep apnea     Past Surgical History  Procedure Laterality Date  . Cardiac catheterization N/A 07/23/2014    Procedure: Left Heart Cath and Coronary Angiography;  Surgeon: Peter M Martinique, MD;  Location: [facility];  Service: Cardiovascular;  Laterality: N/A;  . Cardiac catheterization N/A 07/27/2014    Procedure: Left Heart Cath and Coronary Angiography;  Surgeon: Jettie Booze, MD;  Location: [facility];  Service: Cardiovascular;  Laterality: N/A;  . Cardiac catheterization N/A 07/27/2014    Procedure: Coronary Balloon Angioplasty;  Surgeon: Jettie Booze, MD;  Location: [facility];  Service: Cardiovascular;  Laterality: N/A;  thrombectomy only    Social History   Social History  . Marital Status: Single    Spouse Name: N/A  . Number of Children: 0  . Years of Education: N/A   Occupational History  . unemployed    Social History Main Topics  . Smoking status: Current Every Day Smoker -- 0.50 packs/day for 31 years    Types: Cigarettes    Start date: 08/04/1983  . Smokeless tobacco: Never Used     Comment: about 10-15 cigs daily 09/28/14 - peak 2ppd  . Alcohol Use: No  . Drug Use: No  . Sexual Activity: Not on file   Other Topics Concern  . Not on file   Social History Narrative   Originally from [place], [place]. She has only lived in [place]. She moved to [facility] in 04-18-2014 after her brother died. Currently lives with her sister. She has traveled to [place], [place], [place], [place], [place], [place]. Previously has worked as a Corporate investment banker, in [employer], & also in Data processing manager. She has not been able to keep a job because of her panic attacks. No international travel. Currently has a dog & cat in her residence. No bird, mold, or hot tub exposure. No asbestos exposure.         Filed Vitals:   11/05/14 1547  BP:  124/70  Pulse: 83  Height: 5' 5"  (1.651 m)  Weight: 293 lb 4.8 oz (133.04 kg)  SpO2: 96%    PHYSICAL EXAM General: NAD HEENT: Normal. Neck: No JVD, no thyromegaly. Lungs: Clear to auscultation bilaterally with normal respiratory effort. CV: Nondisplaced PMI.  Regular rate and rhythm, normal S1/S2, no S3/S4, no murmur. No pretibial or periankle edema.  No carotid bruit.   Abdomen: Obese.  Neurologic: Alert and oriented x 3.  Psych: Normal affect. Skin: Normal. Musculoskeletal: No gross deformities. Extremities: No clubbing or cyanosis.  ECG: Most recent ECG reviewed.      ASSESSMENT AND PLAN: 1. CAD with inferior STEMI s/p percutaneous thrombectomy without stent placement: Stable ischemic heart disease. Continue dual antiplatelet therapy with ASA and Brilinta. Continue metoprolol, lisinopril, and pravastatin.  2. Essential HTN: Controlled on amlodipine and lisinopril. No changes.  3. Hyperlipidemia: Lipids noted above. Continue pravastatin.  Dispo: f/u 6 months.  Time spent: 40 minutes, of which greater than 50% was spent reviewing symptoms, relevant blood tests and studies, and discussing management plan with the patient.   Kate Sable, M.D., F.A.C.C.

## 2015-07-13 ENCOUNTER — Emergency Department (HOSPITAL_COMMUNITY)
Admission: EM | Admit: 2015-07-13 | Discharge: 2015-07-13 | Disposition: A | Discharge status: Home / Self Care | Payer: Medicaid Other | Attending: Emergency Medicine | Admitting: Emergency Medicine

## 2015-07-13 ENCOUNTER — Emergency Department (HOSPITAL_COMMUNITY): Payer: Medicaid Other

## 2015-07-13 ENCOUNTER — Encounter (HOSPITAL_COMMUNITY): Payer: Self-pay | Admitting: Emergency Medicine

## 2015-07-13 DIAGNOSIS — Z79899 Other long term (current) drug therapy: Secondary | ICD-10-CM | POA: Diagnosis not present

## 2015-07-13 DIAGNOSIS — Z7984 Long term (current) use of oral hypoglycemic drugs: Secondary | ICD-10-CM | POA: Diagnosis not present

## 2015-07-13 DIAGNOSIS — R0602 Shortness of breath: Secondary | ICD-10-CM | POA: Diagnosis present

## 2015-07-13 DIAGNOSIS — J40 Bronchitis, not specified as acute or chronic: Secondary | ICD-10-CM | POA: Insufficient documentation

## 2015-07-13 DIAGNOSIS — I10 Essential (primary) hypertension: Secondary | ICD-10-CM | POA: Insufficient documentation

## 2015-07-13 DIAGNOSIS — Z6841 Body Mass Index (BMI) 40.0 and over, adult: Secondary | ICD-10-CM | POA: Insufficient documentation

## 2015-07-13 DIAGNOSIS — I252 Old myocardial infarction: Secondary | ICD-10-CM | POA: Insufficient documentation

## 2015-07-13 DIAGNOSIS — E119 Type 2 diabetes mellitus without complications: Secondary | ICD-10-CM | POA: Insufficient documentation

## 2015-07-13 DIAGNOSIS — F329 Major depressive disorder, single episode, unspecified: Secondary | ICD-10-CM | POA: Diagnosis not present

## 2015-07-13 DIAGNOSIS — Z7982 Long term (current) use of aspirin: Secondary | ICD-10-CM | POA: Diagnosis not present

## 2015-07-13 DIAGNOSIS — I2510 Atherosclerotic heart disease of native coronary artery without angina pectoris: Secondary | ICD-10-CM | POA: Diagnosis not present

## 2015-07-13 DIAGNOSIS — E785 Hyperlipidemia, unspecified: Secondary | ICD-10-CM | POA: Insufficient documentation

## 2015-07-13 DIAGNOSIS — F17210 Nicotine dependence, cigarettes, uncomplicated: Secondary | ICD-10-CM | POA: Insufficient documentation

## 2015-07-13 HISTORY — DX: Unspecified osteoarthritis, unspecified site: M19.90

## 2015-07-13 MED ORDER — DM-GUAIFENESIN ER 30-600 MG PO TB12
1.0000 | ORAL_TABLET | Freq: Two times a day (BID) | ORAL | Status: DC
Start: 2015-07-13 — End: 2016-07-13

## 2015-07-13 MED ORDER — ALBUTEROL SULFATE (2.5 MG/3ML) 0.083% IN NEBU
5.0000 mg | INHALATION_SOLUTION | Freq: Once | RESPIRATORY_TRACT | Status: AC
  Administered 2015-07-13: 5 mg via RESPIRATORY_TRACT
  Filled 2015-07-13: qty 6

## 2015-07-13 MED ORDER — PREDNISONE 10 MG PO TABS: 40.0000 mg | ORAL_TABLET | Freq: Every day | ORAL | Status: DC

## 2015-07-13 MED ORDER — PREDNISONE 50 MG PO TABS
60.0000 mg | ORAL_TABLET | Freq: Once | ORAL | Status: AC
  Administered 2015-07-13: 60 mg via ORAL
  Filled 2015-07-13: qty 1

## 2015-07-13 NOTE — ED Notes (Signed)
Pt with interstitial lung disease. States she's been more short of breath lately. Took a couple of breathing treatments at home but no relief. Pt also with cough.

## 2015-07-13 NOTE — ED Notes (Signed)
Pt reports out of home neb meds x 3 weeks- also states that she has not been able to smoke this week

## 2015-07-13 NOTE — ED Notes (Signed)
Pt ambulated to back- She is morbidly obese- reports increased dyspnea after treatments at home

## 2015-07-13 NOTE — ED Notes (Signed)
Pt now states that the home nebs are prescribed to another family member.

## 2015-07-13 NOTE — ED Provider Notes (Signed)
CSN: [id number]     Arrival date & time 07/13/15  1913 History   First MD Initiated Contact with Patient 07/13/15 2023     Chief Complaint  Patient presents with  . Shortness of Breath     (Consider location/radiation/quality/duration/timing/severity/associated sxs/prior Treatment) Patient is a 45 y.o. female presenting with shortness of breath. The history is provided by the patient.  Shortness of Breath Associated symptoms: cough   Associated symptoms: no abdominal pain, no chest pain, no fever, no headaches, no rash, no sore throat and no vomiting   Patient with one-week history of shortness of breath. Patient with persistent cough occasionally productive. No fevers. Patient's room air sats on presentation 97%. Patient has albuterol at home. Patient's also been taking Mucinex not sure if it's Mucinex DM. Patient states she has a history of interstitial lung disease. No fevers.  Past Medical History  Diagnosis Date  . Diabetes type 2, uncontrolled ([place]) Dx 2010  . Morbid obesity ([place])   . Non compliance w medication regimen   . STEMI (ST elevation myocardial infarction) ([place]) 07/23/14  . Tobacco abuse   . CAD (coronary artery disease)     a. Inferior STEMI 07/2014 - s/p thrombectomy of RCA, no stenting.  . Depression     at age 61  . Anxiety     Dx at age 48  . Hypertension     Dx at age 67  . Lung disorder Dx 2011  . Hyperlipidemia Dx 2010  . Diabetes mellitus without complication ([place])   . PCOS (polycystic ovarian syndrome) 1988    no children   . Interstitial lung disease ([facility]) Dx 2011    No biopsy  . IBS (irritable bowel syndrome)   . Gout   . Sleep apnea   . DJD (degenerative joint disease)    Past Surgical History  Procedure Laterality Date  . Cardiac catheterization N/A 07/23/2014    Procedure: Left Heart Cath and Coronary Angiography;  Surgeon: Peter M Martinique, MD;  Location: [facility];  Service: Cardiovascular;  Laterality: N/A;  . Cardiac catheterization  N/A 07/27/2014    Procedure: Left Heart Cath and Coronary Angiography;  Surgeon: Jettie Booze, MD;  Location: [facility];  Service: Cardiovascular;  Laterality: N/A;  . Cardiac catheterization N/A 07/27/2014    Procedure: Coronary Balloon Angioplasty;  Surgeon: Jettie Booze, MD;  Location: [facility];  Service: Cardiovascular;  Laterality: N/A;  thrombectomy only   Family History  Problem Relation Age of Onset  . Coronary artery disease Father 74    CABG  . Lung cancer Father   . Heart disease Father   . Diabetes Father   . Lung cancer Mother   . Diabetes Sister   . Thyroid disease Sister   . Liver disease Brother     alcoholic cirrhosis >  HCC  . COPD Brother   . Asthma Brother   . Cirrhosis Maternal Aunt   . Cirrhosis Maternal Uncle   . Breast cancer Paternal Aunt   . Cancer Maternal Grandmother   . Emphysema Maternal Grandfather   . COPD Paternal Grandmother   . Rheumatologic disease Neg Hx    Social History  Substance Use Topics  . Smoking status: Current Every Day Smoker -- 0.50 packs/day for 31 years    Types: Cigarettes    Start date: 08/04/1983  . Smokeless tobacco: Never Used     Comment: about 10-15 cigs daily 09/28/14 - peak 2ppd  .  Alcohol Use: No   OB History    No data available     Review of Systems  Constitutional: Negative for fever.  HENT: Positive for congestion. Negative for sore throat.   Eyes: Negative for redness.  Respiratory: Positive for cough and shortness of breath.   Cardiovascular: Negative for chest pain.  Gastrointestinal: Negative for nausea, vomiting and abdominal pain.  Genitourinary: Negative for dysuria.  Musculoskeletal: Negative for back pain.  Skin: Negative for rash.  Neurological: Negative for headaches.  Hematological: Does not bruise/bleed easily.  Psychiatric/Behavioral: Negative for confusion.      Allergies  Wellbutrin  Home Medications   Prior to Admission medications   Medication  Sig Start Date End Date Taking? Authorizing Provider  amLODipine (NORVASC) 2.5 MG tablet Take 1 tablet (2.5 mg total) by mouth daily. 08/04/14   Lendon Colonel, NP  aspirin EC 81 MG tablet Take 1 tablet (81 mg total) by mouth daily. 07/28/14   Dayna N Dunn, PA-C  Blood Glucose Monitoring Suppl (TRUE METRIX METER) W/DEVICE KIT 1 each by Does not apply route 3 (three) times daily. 08/03/14   Arnoldo Morale, MD  citalopram (CELEXA) 20 MG tablet Take 1 tablet (20 mg total) by mouth daily. 09/15/14   Josalyn Funches, MD  dextromethorphan-guaiFENesin (MUCINEX DM) 30-600 MG 12hr tablet Take 1 tablet by mouth 2 (two) times daily. 07/13/15   Fredia Sorrow, MD  glucose blood (TRUE METRIX BLOOD GLUCOSE TEST) test strip Use as instructed 08/03/14   Arnoldo Morale, MD  levonorgestrel (MIRENA) 20 MCG/24HR IUD 1 each by Intrauterine route once.  08/28/12   Historical Provider, MD  lisinopril (PRINIVIL,ZESTRIL) 20 MG tablet Take 1 tablet (20 mg total) by mouth daily. 07/28/14   Dayna N Dunn, PA-C  metFORMIN (GLUCOPHAGE) 1000 MG tablet Take 1 tablet (1,000 mg total) by mouth 2 (two) times daily with a meal. 09/15/14   Boykin Nearing, MD  metoprolol (LOPRESSOR) 50 MG tablet Take 1 tablet (50 mg total) by mouth 2 (two) times daily. 07/28/14   Dayna N Dunn, PA-C  nitroGLYCERIN (NITROSTAT) 0.4 MG SL tablet Place 1 tablet (0.4 mg total) under the tongue every 5 (five) minutes as needed for chest pain (up to 3 doses). 07/28/14   Dayna N Dunn, PA-C  pantoprazole (PROTONIX) 40 MG tablet Take 1 tablet (40 mg total) by mouth daily. 09/28/14   Javier Glazier, MD  pravastatin (PRAVACHOL) 80 MG tablet Take 1 tablet (80 mg total) by mouth every evening. 07/28/14   Dayna N Dunn, PA-C  predniSONE (DELTASONE) 10 MG tablet Take 4 tablets (40 mg total) by mouth daily. 07/13/15   Fredia Sorrow, MD  pregabalin (LYRICA) 75 MG capsule Take 1 capsule (75 mg total) by mouth 2 (two) times daily. 09/15/14   Boykin Nearing, MD  ticagrelor (BRILINTA) 90 MG  TABS tablet Take 1 tablet (90 mg total) by mouth 2 (two) times daily. 07/28/14   Dayna N Dunn, PA-C  TRUEPLUS LANCETS 28G MISC 1 each by Does not apply route 3 (three) times daily. 09/03/14   Arnoldo Morale, MD  varenicline (CHANTIX CONTINUING MONTH PAK) 1 MG tablet Take 1 tablet (1 mg total) by mouth 2 (two) times daily. 09/15/14   Josalyn Funches, MD  varenicline (CHANTIX STARTING MONTH PAK) 0.5 MG X 11 & 1 MG X 42 tablet Taper per packet insert 09/15/14   Josalyn Funches, MD   BP 136/106 mmHg  Pulse 89  Temp(Src) 98.8 F (37.1 C) (Oral)  Resp 20  Ht 5' 5"  (1.651 m)  Wt 140.161 kg  BMI 51.42 kg/m2  SpO2 95% Physical Exam  Constitutional: She is oriented to person, place, and time. She appears well-developed and well-nourished. No distress.  HENT:  Head: Normocephalic and atraumatic.  Mouth/Throat: Oropharynx is clear and moist.  Eyes: Conjunctivae and EOM are normal. Pupils are equal, round, and reactive to light.  Neck: Normal range of motion. Neck supple.  Cardiovascular: Normal rate and regular rhythm.   Pulmonary/Chest: Effort normal and breath sounds normal. No respiratory distress.  Abdominal: Soft. Bowel sounds are normal. There is no tenderness.  Musculoskeletal: Normal range of motion.  Neurological: She is alert and oriented to person, place, and time. No cranial nerve deficit. She exhibits normal muscle tone. Coordination normal.  Skin: Skin is warm.  Nursing note and vitals reviewed.   ED Course  Procedures (including critical care time) Labs Review Labs Reviewed - No data to display  Imaging Review Dg Chest 2 View  07/13/2015  CLINICAL DATA:  Chest pain, a shortness of breath for 1 week EXAM: CHEST  2 VIEW COMPARISON:  October 30 2014 FINDINGS: The heart size and mediastinal contours are within normal limits. Bilateral reticulonodular interstitial pattern are identified in both lungs unchanged. There is no pleural effusion or focal pneumonia. The visualized skeletal  structures are unremarkable. IMPRESSION: Bilateral reticulonodular interstitial pattern, unchanged compared to prior chest x-ray of October 30, 2014, chronic. Electronically Signed   By: Abelardo Diesel M.D.   On: 07/13/2015 19:41   I have personally reviewed and evaluated these images and lab results as part of my medical decision-making.   EKG Interpretation None      MDM   Final diagnoses:  Bronchitis   His x-rays negative for pneumonia. Patient's symptoms consistent with bronchitis. Patient was significant improvement after nebulizer treatment. Patient states that she has albuterol inhalers and all the medication she needs at home. Patient started on prednisone here will be continued on that for the next 5 days. Patient also be started on Mucinex DM. Patient not clear if she was taken the DM type. Patient with overall improvement.    Fredia Sorrow, MD 07/13/15 2138

## 2015-07-13 NOTE — ED Notes (Signed)
Respiratory called

## 2015-07-13 NOTE — Discharge Instructions (Signed)
Continue to use your albuterol inhaler 2 puffs every 6 hours at least for the next 7 days. Take prednisone as directed for the next 5 days. Taking Mucinex D DM for the next 7 days. Make an appointment to follow-up with your doctor. Return for any new or worse symptoms.

## 2015-07-13 NOTE — ED Notes (Signed)
Physician in to assess 

## 2015-07-13 NOTE — ED Notes (Signed)
Respiratory in for treatment 

## 2016-01-07 ENCOUNTER — Other Ambulatory Visit (HOSPITAL_COMMUNITY): Payer: Self-pay | Admitting: Internal Medicine

## 2016-01-07 DIAGNOSIS — Z1231 Encounter for screening mammogram for malignant neoplasm of breast: Secondary | ICD-10-CM

## 2016-01-10 ENCOUNTER — Other Ambulatory Visit (HOSPITAL_COMMUNITY): Payer: Self-pay | Admitting: Internal Medicine

## 2016-01-10 DIAGNOSIS — R202 Paresthesia of skin: Secondary | ICD-10-CM

## 2016-01-10 DIAGNOSIS — M545 Low back pain: Secondary | ICD-10-CM

## 2016-01-18 ENCOUNTER — Ambulatory Visit (HOSPITAL_COMMUNITY): Payer: Medicaid Other

## 2016-01-19 ENCOUNTER — Ambulatory Visit (HOSPITAL_COMMUNITY): Payer: Medicaid Other

## 2016-02-16 ENCOUNTER — Ambulatory Visit (HOSPITAL_COMMUNITY)
Admission: RE | Admit: 2016-02-16 | Discharge: 2016-02-16 | Disposition: A | Discharge status: Home / Self Care | Payer: Medicaid Other | Source: Ambulatory Visit | Attending: Internal Medicine | Admitting: Internal Medicine

## 2016-02-16 DIAGNOSIS — N6459 Other signs and symptoms in breast: Secondary | ICD-10-CM | POA: Insufficient documentation

## 2016-02-16 DIAGNOSIS — Z1231 Encounter for screening mammogram for malignant neoplasm of breast: Secondary | ICD-10-CM | POA: Diagnosis not present

## 2016-02-29 ENCOUNTER — Encounter: Payer: Self-pay | Admitting: *Deleted

## 2016-03-06 ENCOUNTER — Encounter: Payer: Self-pay | Admitting: Obstetrics and Gynecology

## 2016-03-06 ENCOUNTER — Ambulatory Visit (INDEPENDENT_AMBULATORY_CARE_PROVIDER_SITE_OTHER): Payer: Medicaid Other | Admitting: Obstetrics and Gynecology

## 2016-03-06 VITALS — BP 142/84 | HR 83 | Wt 322.2 lb

## 2016-03-06 DIAGNOSIS — N6092 Unspecified benign mammary dysplasia of left breast: Secondary | ICD-10-CM | POA: Diagnosis not present

## 2016-03-06 DIAGNOSIS — N6091 Unspecified benign mammary dysplasia of right breast: Secondary | ICD-10-CM | POA: Diagnosis not present

## 2016-03-06 DIAGNOSIS — N644 Mastodynia: Secondary | ICD-10-CM

## 2016-03-06 DIAGNOSIS — N6081 Other benign mammary dysplasias of right breast: Secondary | ICD-10-CM

## 2016-03-06 DIAGNOSIS — N6002 Solitary cyst of left breast: Secondary | ICD-10-CM | POA: Diagnosis not present

## 2016-03-06 DIAGNOSIS — N6001 Solitary cyst of right breast: Secondary | ICD-10-CM

## 2016-03-06 DIAGNOSIS — N6082 Other benign mammary dysplasias of left breast: Secondary | ICD-10-CM

## 2016-03-06 NOTE — Progress Notes (Signed)
[facility] Visit  03/06/16           Patient name: Joanna [name] MRN [id number]  Date of birth: 1970/05/02  CC & HPI:  Joanna [name] is a 46 y.o. female NEW pt presenting today for evaluation of sebaceous cysts, 1 each to each breast. PT was recommended to get these seen , as they were noted on last mammogram, and are tender under bra line.  ROS:  ROS +sebaceous cyst to bilateral breast  Pertinent History Reviewed:   Reviewed: Significant for  Medical         Past Medical History:  Diagnosis Date  . Anxiety    Dx at age 12  . Arthritis    knee  . CAD (coronary artery disease)    a. Inferior STEMI 07/2014 - s/p thrombectomy of RCA, no stenting.  . Depression    at age 89; bipolar recent  . Diabetes mellitus without complication ([place])   . Diabetes type 2, uncontrolled ([place]) Dx 2010  . DJD (degenerative joint disease)   . Gout   . Hyperlipidemia Dx 2010  . Hypertension    Dx at age 34  . IBS (irritable bowel syndrome)   . Interstitial lung disease ([place]) Dx 2011   No biopsy  . Lung disorder Dx 2011  . Morbid obesity ([place])   . Neuropathy ([place])   . Non compliance w medication regimen   . PCOS (polycystic ovarian syndrome) 1988   no children   . Sleep apnea   . STEMI (ST elevation myocardial infarction) ([place]) 07/23/14  . Tobacco abuse                               Surgical Hx:    Past Surgical History:  Procedure Laterality Date  . CARDIAC CATHETERIZATION N/A 07/23/2014   Procedure: Left Heart Cath and Coronary Angiography;  Surgeon: Peter M Martinique, MD;  Location: [facility];  Service: Cardiovascular;  Laterality: N/A;  . CARDIAC CATHETERIZATION N/A 07/27/2014   Procedure: Left Heart Cath and Coronary Angiography;  Surgeon: Jettie Booze, MD;  Location: [facility];  Service: Cardiovascular;  Laterality: N/A;  . CARDIAC CATHETERIZATION N/A 07/27/2014   Procedure: Coronary Balloon Angioplasty;  Surgeon: Jettie Booze, MD;  Location: [facility];  Service: Cardiovascular;  Laterality: N/A;  thrombectomy only   Medications: Reviewed & Updated - see associated section                       Current Outpatient Prescriptions:  .  albuterol (PROVENTIL HFA;VENTOLIN HFA) 108 (90 Base) MCG/ACT inhaler, Inhale into the lungs every 6 (six) hours as needed for wheezing or shortness of breath., Disp: , Rfl:  .  amLODipine (NORVASC) 2.5 MG tablet, Take 1 tablet (2.5 mg total) by mouth daily. (Patient taking differently: Take 5 mg by mouth daily. ), Disp: 30 tablet, Rfl: 11 .  aspirin EC 81 MG tablet, Take 1 tablet (81 mg total) by mouth daily., Disp: 30 tablet, Rfl: 11 .  baclofen (LIORESAL) 10 MG tablet, Take 10 mg by mouth 3 (three) times daily., Disp: , Rfl:  .  Blood Glucose Monitoring Suppl (TRUE METRIX METER) W/DEVICE KIT, 1 each by Does not apply route 3 (three) times daily., Disp: 1 kit, Rfl: 0 .  cloNIDine (CATAPRES) 0.1 MG tablet, Take 0.1 mg by mouth 2 (two) times daily., Disp: ,  Rfl:  .  gabapentin (NEURONTIN) 300 MG capsule, Take 300 mg by mouth 3 (three) times daily., Disp: , Rfl:  .  glipiZIDE (GLUCOTROL) 10 MG tablet, Take 10 mg by mouth daily before breakfast., Disp: , Rfl:  .  glucose blood (TRUE METRIX BLOOD GLUCOSE TEST) test strip, Use as instructed, Disp: 100 each, Rfl: 12 .  levonorgestrel (MIRENA) 20 MCG/24HR IUD, 1 each by Intrauterine route once. , Disp: , Rfl:  .  lisinopril (PRINIVIL,ZESTRIL) 20 MG tablet, Take 1 tablet (20 mg total) by mouth daily., Disp: 30 tablet, Rfl: 6 .  lisinopril-hydrochlorothiazide (PRINZIDE,ZESTORETIC) 20-25 MG tablet, Take 1 tablet by mouth daily., Disp: , Rfl:  .  metFORMIN (GLUCOPHAGE) 1000 MG tablet, Take 1 tablet (1,000 mg total) by mouth 2 (two) times daily with a meal., Disp: 60 tablet, Rfl: 5 .  metoprolol (LOPRESSOR) 50 MG tablet, Take 1 tablet (50 mg total) by mouth 2 (two) times daily., Disp: 60 tablet, Rfl: 6 .  nitroGLYCERIN (NITROSTAT) 0.4 MG SL tablet, Place 1 tablet  (0.4 mg total) under the tongue every 5 (five) minutes as needed for chest pain (up to 3 doses)., Disp: 25 tablet, Rfl: 3 .  pantoprazole (PROTONIX) 40 MG tablet, Take 1 tablet (40 mg total) by mouth daily., Disp: 30 tablet, Rfl: 3 .  pravastatin (PRAVACHOL) 80 MG tablet, Take 1 tablet (80 mg total) by mouth every evening., Disp: 30 tablet, Rfl: 6 .  triamcinolone ointment (KENALOG) 0.5 %, Apply 1 application topically 2 (two) times daily., Disp: , Rfl:  .  TRUEPLUS LANCETS 28G MISC, 1 each by Does not apply route 3 (three) times daily., Disp: 100 each, Rfl: 5 .  venlafaxine (EFFEXOR) 75 MG tablet, Take 75 mg by mouth 2 (two) times daily., Disp: , Rfl:  .  citalopram (CELEXA) 20 MG tablet, Take 1 tablet (20 mg total) by mouth daily. (Patient not taking: Reported on 03/06/2016), Disp: 30 tablet, Rfl: 2 .  dextromethorphan-guaiFENesin (MUCINEX DM) 30-600 MG 12hr tablet, Take 1 tablet by mouth 2 (two) times daily. (Patient not taking: Reported on 03/06/2016), Disp: 14 tablet, Rfl: 1 .  predniSONE (DELTASONE) 10 MG tablet, Take 4 tablets (40 mg total) by mouth daily. (Patient not taking: Reported on 03/06/2016), Disp: 20 tablet, Rfl: 0 .  pregabalin (LYRICA) 75 MG capsule, Take 1 capsule (75 mg total) by mouth 2 (two) times daily. (Patient not taking: Reported on 03/06/2016), Disp: 60 capsule, Rfl: 2 .  ticagrelor (BRILINTA) 90 MG TABS tablet, Take 1 tablet (90 mg total) by mouth 2 (two) times daily. (Patient not taking: Reported on 03/06/2016), Disp: 60 tablet, Rfl: 1 .  varenicline (CHANTIX CONTINUING MONTH PAK) 1 MG tablet, Take 1 tablet (1 mg total) by mouth 2 (two) times daily. (Patient not taking: Reported on 03/06/2016), Disp: 60 tablet, Rfl: 2 .  varenicline (CHANTIX STARTING MONTH PAK) 0.5 MG X 11 & 1 MG X 42 tablet, Taper per packet insert (Patient not taking: Reported on 03/06/2016), Disp: 53 tablet, Rfl: 0   Social History: Reviewed -  reports that she has been smoking Cigarettes.  She started  smoking about 32 years ago. She has a 15.50 pack-year smoking history. She has never used smokeless tobacco.  Objective Findings:  Vitals: Blood pressure (!) 142/84, pulse 83, weight (!) 322 lb 3.2 oz (146.1 kg).   INCISION AND DRAINAGE # 1 PROCEDURE NOTE: EXCISION Patient identification was confirmed and verbal consent was obtained. This procedure was performed by Jonnie Kind, MD at  11:23 AM. Site: left breast Sterile procedures observed Needle size: 25 Anesthetic used (type and amt): 1% lidocaine with epi, 5 cc Blade size: 15 Drainage: 1 cm sebaceous cyst Complexity: simple Packing used: none Site anesthetized, incision made over site, wound drained and explored loculations, rinsed with copious amounts of normal saline, wound packed with sterile gauze, covered with dry, sterile dressing.  Pt tolerated procedure well without complications.  Instructions for care discussed verbally and pt provided with additional written instructions for homecare and f/u.   INCISION AND DRAINAGE #2 PROCEDURE NOTE:EXCISION Patient identification was confirmed and verbal consent was obtained. This procedure was performed by Jonnie Kind, MD at 11:23 AM  Site: right breast Sterile procedures observed Needle size: 25 Anesthetic used (type and amt): 1% lidocaine with epi, 10 cc Blade size: 15 Drainage: 2 cm x 2 cm sebaceous cyst Complexity: Complex Packing used: none used Site anesthetized, incision made over site, wound drained and explored loculations, rinsed with copious amounts of normal saline, wound packed with sterile gauze, covered with dry, sterile dressing.  Pt tolerated procedure well without complications.  Instructions for care discussed verbally and pt provided with additional written instructions for homecare and f/u.   LACERATION REPAIR #1 PROCEDURE NOTE The patient's identification was confirmed and consent was obtained. This procedure was performed by Jonnie Kind, MD at 11:23  AM  Site: left breast Sterile procedures observed Anesthetic used (type and amt): already anesthetized from I&D, see note above Suture type/size: 4-0 vicryl  Length: 2 cm # of Sutures: 1 Technique: simple interrupted  Complexity: simple Antibx ointment applied Site anesthetized, irrigated with NS, explored without evidence of foreign body, wound well approximated, site covered with dry, sterile dressing.  Patient tolerated procedure well without complications. Instructions for care discussed verbally and patient provided with additional written instructions for homecare and f/u.   LACERATION REPAIR #2 PROCEDURE NOTE The patient's identification was confirmed and consent was obtained. This procedure was performed by Jonnie Kind, MD at 11:15 AM. Site: right breast Sterile procedures observed Anesthetic used (type and amt): already anesthetized from I&D, see note above Suture type/size:4-0 vicryl  Length: 3.5cm # of Sutures: 3 Technique: simple interrupted Complexity: simple Antibx ointment applied Site anesthetized, irrigated with NS, explored without evidence of foreign body, wound well approximated, site covered with dry, sterile dressing.  Patient tolerated procedure well without complications. Instructions for care discussed verbally and patient provided with additional written instructions for homecare and f/u.   Assessment & Plan:   A:  1. Sebaceous cyst  P:  1. Follow up 2 weeks for recheck     By signing my name below, I, Sonum Patel, attest that this documentation has been prepared under the direction and in the presence of Jonnie Kind, MD. Electronically Signed: Sonum Patel, Education administrator. 03/06/16. 10:48 AM.  I personally performed the services described in this documentation, which was SCRIBED in my presence. The recorded information has been reviewed and considered accurate. It has been edited as necessary during review. Jonnie Kind, MD

## 2016-03-07 DIAGNOSIS — N6081 Other benign mammary dysplasias of right breast: Secondary | ICD-10-CM | POA: Insufficient documentation

## 2016-03-07 MED ORDER — CEPHALEXIN 500 MG PO CAPS: 500.0000 mg | ORAL_CAPSULE | Freq: Four times a day (QID) | ORAL | 0 refills | Status: DC

## 2016-03-20 ENCOUNTER — Ambulatory Visit: Payer: Medicaid Other | Admitting: Obstetrics and Gynecology

## 2016-06-13 ENCOUNTER — Encounter: Payer: Self-pay | Admitting: Cardiovascular Disease

## 2016-06-14 ENCOUNTER — Other Ambulatory Visit (HOSPITAL_COMMUNITY): Payer: Self-pay | Admitting: Internal Medicine

## 2016-06-14 ENCOUNTER — Ambulatory Visit (HOSPITAL_COMMUNITY)
Admission: RE | Admit: 2016-06-14 | Discharge: 2016-06-14 | Disposition: A | Discharge status: Home / Self Care | Payer: Medicaid Other | Source: Ambulatory Visit | Attending: Internal Medicine | Admitting: Internal Medicine

## 2016-06-14 DIAGNOSIS — M1711 Unilateral primary osteoarthritis, right knee: Secondary | ICD-10-CM | POA: Diagnosis not present

## 2016-06-14 DIAGNOSIS — M25561 Pain in right knee: Secondary | ICD-10-CM

## 2016-06-14 DIAGNOSIS — M25461 Effusion, right knee: Secondary | ICD-10-CM | POA: Diagnosis not present

## 2016-07-11 ENCOUNTER — Encounter: Payer: Self-pay | Admitting: Orthopaedic Surgery

## 2016-07-11 ENCOUNTER — Ambulatory Visit (INDEPENDENT_AMBULATORY_CARE_PROVIDER_SITE_OTHER): Payer: Medicaid Other | Admitting: Orthopaedic Surgery

## 2016-07-11 VITALS — BP 190/101 | HR 82 | Ht 66.0 in | Wt 316.0 lb

## 2016-07-11 DIAGNOSIS — F17210 Nicotine dependence, cigarettes, uncomplicated: Secondary | ICD-10-CM | POA: Diagnosis not present

## 2016-07-11 DIAGNOSIS — M25561 Pain in right knee: Secondary | ICD-10-CM

## 2016-07-11 DIAGNOSIS — G8929 Other chronic pain: Secondary | ICD-10-CM | POA: Diagnosis not present

## 2016-07-11 NOTE — Patient Instructions (Signed)

## 2016-07-11 NOTE — Progress Notes (Signed)
Subjective:    Patient ID: Joanna [name], female    DOB: [birth year], 46 y.o.   MRN: [id number]  HPI She has pain of the right knee present for many months.  It is getting worse.  She has swelling and popping and some giving way.  It has gotten worse over the last two months.  She has no redness.  She has no trauma.  She saw Dr. Maudie Mercury 06-14-16 and had x-rays of the knee.  I have reviewed his notes and the x-rays.  She cannot take any NSAIDs.  She has used Voltaren Gel, heat, ice and other rubs with little help.  She is getting worse.  She has no numbness.  She is a smoker and willing to cut back.  Review of Systems  HENT: Negative for congestion.   Cardiovascular: Negative for chest pain and leg swelling.  Endocrine: Positive for cold intolerance.  Musculoskeletal: Positive for arthralgias and gait problem.  Allergic/Immunologic: Positive for environmental allergies.  Psychiatric/Behavioral: The patient is nervous/anxious.    Past Medical History:  Diagnosis Date  . Anxiety    Dx at age 62  . Arthritis    knee  . CAD (coronary artery disease)    a. Inferior STEMI 07/2014 - s/p thrombectomy of RCA, no stenting.  . Depression    at age 13; bipolar recent  . Diabetes mellitus without complication ([place])   . Diabetes type 2, uncontrolled ([place]) Dx 2010  . DJD (degenerative joint disease)   . Gout   . Hyperlipidemia Dx 2010  . Hypertension    Dx at age 24  . IBS (irritable bowel syndrome)   . Interstitial lung disease ([place]) Dx 2011   No biopsy  . Lung disorder Dx 2011  . Morbid obesity ([place])   . Neuropathy   . Non compliance w medication regimen   . PCOS (polycystic ovarian syndrome) 1988   no children   . Sleep apnea   . STEMI (ST elevation myocardial infarction) ([place]) 07/23/14  . Tobacco abuse     Past Surgical History:  Procedure Laterality Date  . CARDIAC CATHETERIZATION N/A 07/23/2014   Procedure: Left Heart Cath and Coronary Angiography;  Surgeon: Peter M Martinique, MD;   Location: [facility];  Service: Cardiovascular;  Laterality: N/A;  . CARDIAC CATHETERIZATION N/A 07/27/2014   Procedure: Left Heart Cath and Coronary Angiography;  Surgeon: Jettie Booze, MD;  Location: [facility];  Service: Cardiovascular;  Laterality: N/A;  . CARDIAC CATHETERIZATION N/A 07/27/2014   Procedure: Coronary Balloon Angioplasty;  Surgeon: Jettie Booze, MD;  Location: [facility];  Service: Cardiovascular;  Laterality: N/A;  thrombectomy only    Current Outpatient Prescriptions on File Prior to Visit  Medication Sig Dispense Refill  . albuterol (PROVENTIL HFA;VENTOLIN HFA) 108 (90 Base) MCG/ACT inhaler Inhale into the lungs every 6 (six) hours as needed for wheezing or shortness of breath.    Marland Kitchen amLODipine (NORVASC) 2.5 MG tablet Take 1 tablet (2.5 mg total) by mouth daily. (Patient taking differently: Take 5 mg by mouth daily. ) 30 tablet 11  . aspirin EC 81 MG tablet Take 1 tablet (81 mg total) by mouth daily. 30 tablet 11  . baclofen (LIORESAL) 10 MG tablet Take 10 mg by mouth 3 (three) times daily.    . cloNIDine (CATAPRES) 0.1 MG tablet Take 0.1 mg by mouth 2 (two) times daily.    Marland Kitchen gabapentin (NEURONTIN) 300 MG capsule Take 300 mg by mouth  3 (three) times daily.    Marland Kitchen glipiZIDE (GLUCOTROL) 10 MG tablet Take 10 mg by mouth daily before breakfast.    . lisinopril (PRINIVIL,ZESTRIL) 20 MG tablet Take 1 tablet (20 mg total) by mouth daily. 30 tablet 6  . lisinopril-hydrochlorothiazide (PRINZIDE,ZESTORETIC) 20-25 MG tablet Take 1 tablet by mouth daily.    . metFORMIN (GLUCOPHAGE) 1000 MG tablet Take 1 tablet (1,000 mg total) by mouth 2 (two) times daily with a meal. 60 tablet 5  . metoprolol (LOPRESSOR) 50 MG tablet Take 1 tablet (50 mg total) by mouth 2 (two) times daily. 60 tablet 6  . nitroGLYCERIN (NITROSTAT) 0.4 MG SL tablet Place 1 tablet (0.4 mg total) under the tongue every 5 (five) minutes as needed for chest pain (up to 3 doses). 25 tablet 3   . pantoprazole (PROTONIX) 40 MG tablet Take 1 tablet (40 mg total) by mouth daily. 30 tablet 3  . pravastatin (PRAVACHOL) 80 MG tablet Take 1 tablet (80 mg total) by mouth every evening. 30 tablet 6  . ticagrelor (BRILINTA) 90 MG TABS tablet Take 1 tablet (90 mg total) by mouth 2 (two) times daily. 60 tablet 1  . triamcinolone ointment (KENALOG) 0.5 % Apply 1 application topically 2 (two) times daily.    Marland Kitchen venlafaxine (EFFEXOR) 75 MG tablet Take 75 mg by mouth 2 (two) times daily.    . Blood Glucose Monitoring Suppl (TRUE METRIX METER) W/DEVICE KIT 1 each by Does not apply route 3 (three) times daily. 1 kit 0  . cephALEXin (KEFLEX) 500 MG capsule Take 1 capsule (500 mg total) by mouth 4 (four) times daily. (Patient not taking: Reported on 07/11/2016) 28 capsule 0  . citalopram (CELEXA) 20 MG tablet Take 1 tablet (20 mg total) by mouth daily. (Patient not taking: Reported on 03/06/2016) 30 tablet 2  . dextromethorphan-guaiFENesin (MUCINEX DM) 30-600 MG 12hr tablet Take 1 tablet by mouth 2 (two) times daily. (Patient not taking: Reported on 03/06/2016) 14 tablet 1  . glucose blood (TRUE METRIX BLOOD GLUCOSE TEST) test strip Use as instructed 100 each 12  . levonorgestrel (MIRENA) 20 MCG/24HR IUD 1 each by Intrauterine route once.     . predniSONE (DELTASONE) 10 MG tablet Take 4 tablets (40 mg total) by mouth daily. (Patient not taking: Reported on 03/06/2016) 20 tablet 0  . pregabalin (LYRICA) 75 MG capsule Take 1 capsule (75 mg total) by mouth 2 (two) times daily. (Patient not taking: Reported on 03/06/2016) 60 capsule 2  . TRUEPLUS LANCETS 28G MISC 1 each by Does not apply route 3 (three) times daily. 100 each 5  . varenicline (CHANTIX CONTINUING MONTH PAK) 1 MG tablet Take 1 tablet (1 mg total) by mouth 2 (two) times daily. (Patient not taking: Reported on 03/06/2016) 60 tablet 2  . varenicline (CHANTIX STARTING MONTH PAK) 0.5 MG X 11 & 1 MG X 42 tablet Taper per packet insert (Patient not taking:  Reported on 03/06/2016) 53 tablet 0   No current facility-administered medications on file prior to visit.     Social History   Social History  . Marital status: Single    Spouse name: N/A  . Number of children: 0  . Years of education: N/A   Occupational History  . unemployed    Social History Main Topics  . Smoking status: Current Every Day Smoker    Packs/day: 0.50    Years: 31.00    Types: Cigarettes    Start date: 08/04/1983  . Smokeless tobacco: Never  Used     Comment: about 10-15 cigs daily 09/28/14 - peak 2ppd  . Alcohol use No  . Drug use: No  . Sexual activity: No   Other Topics Concern  . Not on file   Social History Narrative   Originally from [place], [place]. She has only lived in [place]. She moved to [facility] in 04/07/2014 after her brother died. Currently lives with her sister. She has traveled to [place], [place], [place], [place], [place], [place]. Previously has worked as a Corporate investment banker, in [employer], & also in Data processing manager. She has not been able to keep a job because of her panic attacks. No international travel. Currently has a dog & cat in her residence. No bird, mold, or hot tub exposure. No asbestos exposure.        Family History  Problem Relation Age of Onset  . Coronary artery disease Father 31       CABG  . Lung cancer Father   . Heart disease Father   . Diabetes Father   . Hypertension Father   . Hyperlipidemia Father   . Drug abuse Father   . Cancer Father        lung  . Lung cancer Mother   . Depression Mother   . Hypertension Mother   . Drug abuse Mother   . Cancer Mother        lung cancer  . Liver disease Brother        alcoholic cirrhosis >  HCC  . COPD Brother   . Asthma Brother   . Depression Brother   . Stroke Brother   . Drug abuse Brother   . Diabetes Sister   . Thyroid disease Sister   . Depression Sister   . Heart disease Sister   . Hypertension Sister   . Cirrhosis Maternal Aunt   . Cirrhosis Maternal Uncle   . Breast cancer Paternal Aunt    . Cancer Maternal Grandmother   . Emphysema Maternal Grandfather   . COPD Paternal Grandmother   . Rheumatologic disease Neg Hx     BP (!) 190/101   Pulse 82   Ht 5' 6" (1.676 m)   Wt (!) 316 lb (143.3 kg)   BMI 51.00 kg/m      Objective:   Physical Exam  Constitutional: She is oriented to person, place, and time. She appears well-developed and well-nourished.  HENT:  Head: Normocephalic and atraumatic.  Eyes: Conjunctivae and EOM are normal. Pupils are equal, round, and reactive to light.  Neck: Normal range of motion. Neck supple.  Cardiovascular: Normal rate, regular rhythm and intact distal pulses.   Pulmonary/Chest: Effort normal.  Abdominal: Soft.  Musculoskeletal: She exhibits tenderness (Right knee tender, ROM 0 to 110, crepitus present, medial joint line pain and positive medial McMurray, limp right, left knee with crepitus ROM 0 to 115.).  Neurological: She is alert and oriented to person, place, and time. She displays normal reflexes. No cranial nerve deficit. She exhibits normal muscle tone. Coordination normal.  Skin: Skin is warm and dry.  Psychiatric: She has a normal mood and affect. Her behavior is normal. Judgment and thought content normal.  Vitals reviewed.         Assessment & Plan:   Encounter Diagnoses  Name Primary?  . Chronic pain of right knee Yes  . Cigarette nicotine dependence without complication    PROCEDURE NOTE:  The patient requests injections of the right knee , verbal consent  was obtained.  The right knee was prepped appropriately after time out was performed.   Sterile technique was observed and injection of 1 cc of Depo-Medrol 40 mg with several cc's of plain xylocaine. Anesthesia was provided by ethyl chloride and a 20-gauge needle was used to inject the knee area. The injection was tolerated well.  A band aid dressing was applied.  The patient was advised to apply ice later today and tomorrow to the injection sight as  needed.  Return in two weeks.  Consider MRI of the right knee.  Call if any problem.  Precautions discussed.  Electronically Signed Sanjuana Kava, MD 6/5/20188:52 AM

## 2016-07-13 ENCOUNTER — Ambulatory Visit (INDEPENDENT_AMBULATORY_CARE_PROVIDER_SITE_OTHER): Payer: Medicaid Other | Admitting: Cardiovascular Disease

## 2016-07-13 ENCOUNTER — Encounter: Payer: Self-pay | Admitting: Cardiovascular Disease

## 2016-07-13 VITALS — BP 108/62 | HR 73 | Ht 66.0 in | Wt 319.0 lb

## 2016-07-13 DIAGNOSIS — I10 Essential (primary) hypertension: Secondary | ICD-10-CM | POA: Diagnosis not present

## 2016-07-13 DIAGNOSIS — I25118 Atherosclerotic heart disease of native coronary artery with other forms of angina pectoris: Secondary | ICD-10-CM

## 2016-07-13 DIAGNOSIS — I252 Old myocardial infarction: Secondary | ICD-10-CM

## 2016-07-13 DIAGNOSIS — E782 Mixed hyperlipidemia: Secondary | ICD-10-CM

## 2016-07-13 NOTE — Patient Instructions (Addendum)
Your physician wants you to follow-up in: 1 year Dr Virgina Jock will receive a reminder letter in the mail two months in advance. If you don't receive a letter, please call our office to schedule the follow-up appointment.  STOP Clonidine   Continue all other medications   No tests or labs today     Thank you for choosing [facility] !

## 2016-07-13 NOTE — Progress Notes (Signed)
SUBJECTIVE: The patient presents for past due follow-up. I last evaluated her on 11/05/14. She has a history of obesity, coronary artery disease with percutaneous coronary intervention for an inferior ST elevation myocardial infarction, hypertension, diabetes mellitus, hyperlipidemia , an obstructive sleep apnea.   On 07/27/14, a 90% proximal to mid RCA lesion was found for which she underwent successful percutaneous thrombectomy with a large degree of debris removal. LV EDP was elevated. A decision was made to continue dual antiplatelet therapy although no stent was placed. This was a relook cath after undergoing coronary angiography on 6/16.  Echocardiogram on 07/24/14 demonstrated normal left ventricular systolic function and regional wall motion, LVEF 55-60%.  She has had to use nitroglycerin once in the past 4 months for chest pain. This occurred in the context of a high degree of anxiety and stress. She otherwise denies exertional chest pain. Chronic exertional dyspnea from morbid obesity is stable. She denies palpitations, orthopnea, syncope, and paroxysmal nocturnal dyspnea.  ECG performed in the office today which I ordered and personally interpreted demonstrates normal sinus rhythm with no ischemic ST segment or T-wave abnormalities, nor any arrhythmias.    Review of Systems: As per "subjective", otherwise negative.  Allergies  Allergen Reactions  . Wellbutrin [Bupropion] Rash    Current Outpatient Prescriptions  Medication Sig Dispense Refill  . albuterol (PROVENTIL HFA;VENTOLIN HFA) 108 (90 Base) MCG/ACT inhaler Inhale into the lungs every 6 (six) hours as needed for wheezing or shortness of breath.    Marland Kitchen amLODipine (NORVASC) 2.5 MG tablet Take 1 tablet (2.5 mg total) by mouth daily. (Patient taking differently: Take 5 mg by mouth daily. ) 30 tablet 11  . aspirin EC 81 MG tablet Take 1 tablet (81 mg total) by mouth daily. 30 tablet 11  . baclofen (LIORESAL) 10 MG tablet  Take 10 mg by mouth 3 (three) times daily.    . Blood Glucose Monitoring Suppl (TRUE METRIX METER) W/DEVICE KIT 1 each by Does not apply route 3 (three) times daily. 1 kit 0  . cloNIDine (CATAPRES) 0.1 MG tablet Take 0.1 mg by mouth 2 (two) times daily.    Marland Kitchen gabapentin (NEURONTIN) 300 MG capsule Take 300 mg by mouth 3 (three) times daily.    Marland Kitchen glipiZIDE (GLUCOTROL) 10 MG tablet Take 10 mg by mouth daily before breakfast.    . glucose blood (TRUE METRIX BLOOD GLUCOSE TEST) test strip Use as instructed 100 each 12  . levonorgestrel (MIRENA) 20 MCG/24HR IUD 1 each by Intrauterine route once.     . Liraglutide (VICTOZA [place]) Inject 0.06 Units into the skin.    Marland Kitchen lisinopril-hydrochlorothiazide (PRINZIDE,ZESTORETIC) 20-25 MG tablet Take 1 tablet by mouth daily.    Marland Kitchen lurasidone (LATUDA) 20 MG TABS tablet Take 20 mg by mouth.    . metFORMIN (GLUCOPHAGE) 1000 MG tablet Take 1 tablet (1,000 mg total) by mouth 2 (two) times daily with a meal. 60 tablet 5  . metoprolol (LOPRESSOR) 50 MG tablet Take 1 tablet (50 mg total) by mouth 2 (two) times daily. 60 tablet 6  . nitroGLYCERIN (NITROSTAT) 0.4 MG SL tablet Place 1 tablet (0.4 mg total) under the tongue every 5 (five) minutes as needed for chest pain (up to 3 doses). 25 tablet 3  . pantoprazole (PROTONIX) 40 MG tablet Take 1 tablet (40 mg total) by mouth daily. 30 tablet 3  . pravastatin (PRAVACHOL) 80 MG tablet Take 1 tablet (80 mg total) by mouth every evening. 30 tablet 6  .  traZODone (DESYREL) 50 MG tablet Take 50 mg by mouth at bedtime.    . triamcinolone ointment (KENALOG) 0.5 % Apply 1 application topically 2 (two) times daily.    . TRUEPLUS LANCETS 28G MISC 1 each by Does not apply route 3 (three) times daily. 100 each 5  . venlafaxine (EFFEXOR) 75 MG tablet Take 150 mg by mouth 2 (two) times daily.      No current facility-administered medications for this visit.     Past Medical History:  Diagnosis Date  . Anxiety    Dx at age 58  . Arthritis     knee  . CAD (coronary artery disease)    a. Inferior STEMI 07/2014 - s/p thrombectomy of RCA, no stenting.  . Depression    at age 8; bipolar recent  . Diabetes mellitus without complication ([place])   . Diabetes type 2, uncontrolled ([place]) Dx 2010  . DJD (degenerative joint disease)   . Gout   . Hyperlipidemia Dx 2010  . Hypertension    Dx at age 27  . IBS (irritable bowel syndrome)   . Interstitial lung disease ([place]) Dx 2011   No biopsy  . Lung disorder Dx 2011  . Morbid obesity ([place])   . Neuropathy   . Non compliance w medication regimen   . PCOS (polycystic ovarian syndrome) 1988   no children   . Sleep apnea   . STEMI (ST elevation myocardial infarction) ([place]) 07/23/14  . Tobacco abuse     Past Surgical History:  Procedure Laterality Date  . CARDIAC CATHETERIZATION N/A 07/23/2014   Procedure: Left Heart Cath and Coronary Angiography;  Surgeon: Peter M Martinique, MD;  Location: [facility];  Service: Cardiovascular;  Laterality: N/A;  . CARDIAC CATHETERIZATION N/A 07/27/2014   Procedure: Left Heart Cath and Coronary Angiography;  Surgeon: Jettie Booze, MD;  Location: [facility];  Service: Cardiovascular;  Laterality: N/A;  . CARDIAC CATHETERIZATION N/A 07/27/2014   Procedure: Coronary Balloon Angioplasty;  Surgeon: Jettie Booze, MD;  Location: [facility];  Service: Cardiovascular;  Laterality: N/A;  thrombectomy only    Social History   Social History  . Marital status: Single    Spouse name: N/A  . Number of children: 0  . Years of education: N/A   Occupational History  . unemployed    Social History Main Topics  . Smoking status: Current Every Day Smoker    Packs/day: 0.50    Years: 31.00    Types: Cigarettes    Start date: 08/04/1983  . Smokeless tobacco: Never Used     Comment: about 10-15 cigs daily 09/28/14 - peak 2ppd  . Alcohol use No  . Drug use: No  . Sexual activity: No   Other Topics Concern  . Not on file    Social History Narrative   Originally from [place], [place]. She has only lived in [place]. She moved to [facility] in 03-28-2014 after her brother died. Currently lives with her sister. She has traveled to [place], [place], [place], [place], [place], [place]. Previously has worked as a Corporate investment banker, in [employer], & also in Data processing manager. She has not been able to keep a job because of her panic attacks. No international travel. Currently has a dog & cat in her residence. No bird, mold, or hot tub exposure. No asbestos exposure.         Vitals:   07/13/16 0921  BP: 108/62  Pulse: 73  SpO2: 90%  Weight: (!) 319 lb (144.7 kg)  Height: _0  (1.676 m)    Wt Readings from Last 3 Encounters:  07/13/16 (!) 319 lb (144.7 kg)  07/11/16 (!) 316 lb (143.3 kg)  03/06/16 (!) 322 lb 3.2 oz (146.1 kg)     PHYSICAL EXAM General: NAD HEENT: Normal. Neck: No JVD, no thyromegaly. Lungs: Clear to auscultation bilaterally with normal respiratory effort. CV: Nondisplaced PMI.  Regular rate and rhythm, normal S1/S2, no S3/S4, no murmur. No pretibial or periankle edema.  No carotid bruit.   Abdomen: Obese.  Neurologic: Alert and oriented.  Psych: Normal affect. Skin: Normal. Musculoskeletal: No gross deformities.    ECG: Most recent ECG reviewed.   Labs: Lab Results  Component Value Date/Time   K 4.1 10/30/2014 05:45 AM   BUN 13 10/30/2014 05:45 AM   CREATININE 0.78 10/30/2014 05:45 AM   ALT 14 09/03/2014 11:20 AM   TSH 3.540 07/23/2014 04:45 PM   HGB 13.7 10/30/2014 05:45 AM     Lipids: Lab Results  Component Value Date/Time   LDLCALC 104 (H) 09/03/2014 11:20 AM   CHOL 165 09/03/2014 11:20 AM   TRIG 196 (H) 09/03/2014 11:20 AM   HDL 22 (L) 09/03/2014 11:20 AM       ASSESSMENT AND PLAN:  1. CAD with inferior STEMI s/p percutaneous thrombectomy without stent placement: Symptomatically stable. Continue aspirin, metoprolol, lisinopril, and pravastatin.  2. Essential HTN: Controlled on amlodipine, clonidine,  and Prinzide. However, she is on multiple antihypertensive medications. Blood pressure is low normal. I will discontinue clonidine.  3. Hyperlipidemia: Continue pravastatin 80 mg. I will obtain a copy of most recent lipids from PCP.  4. Morbid obesity: Needs significant weight loss with exercise and dietary modification.   Disposition: Follow up 1 yr.  Kate Sable, M.D., F.A.C.C.

## 2016-07-25 ENCOUNTER — Ambulatory Visit (INDEPENDENT_AMBULATORY_CARE_PROVIDER_SITE_OTHER): Payer: Medicaid Other | Admitting: Orthopaedic Surgery

## 2016-07-25 ENCOUNTER — Encounter: Payer: Self-pay | Admitting: Orthopaedic Surgery

## 2016-07-25 VITALS — BP 109/71 | HR 64 | Temp 97.3°F | Ht 66.0 in | Wt 313.0 lb

## 2016-07-25 DIAGNOSIS — F17210 Nicotine dependence, cigarettes, uncomplicated: Secondary | ICD-10-CM | POA: Diagnosis not present

## 2016-07-25 DIAGNOSIS — G8929 Other chronic pain: Secondary | ICD-10-CM

## 2016-07-25 DIAGNOSIS — M25561 Pain in right knee: Secondary | ICD-10-CM

## 2016-07-25 NOTE — Patient Instructions (Signed)
Steps to Quit Smoking Smoking tobacco can be bad for your health. It can also affect almost every organ in your body. Smoking puts you and people around you at risk for many serious [place]-lasting (chronic) diseases. Quitting smoking is hard, but it is one of the best things that you can do for your health. It is never too late to quit. What are the benefits of quitting smoking? When you quit smoking, you lower your risk for getting serious diseases and conditions. They can include:  Lung cancer or lung disease.  Heart disease.  Stroke.  Heart attack.  Not being able to have children (infertility).  Weak bones (osteoporosis) and broken bones (fractures).  If you have coughing, wheezing, and shortness of breath, those symptoms may get better when you quit. You may also get sick less often. If you are pregnant, quitting smoking can help to lower your chances of having a baby of low birth weight. What can I do to help me quit smoking? Talk with your doctor about what can help you quit smoking. Some things you can do (strategies) include:  Quitting smoking totally, instead of slowly cutting back how much you smoke over a period of time.  Going to in-person counseling. You are more likely to quit if you go to many counseling sessions.  Using resources and support systems, such as: ? Online chats with a counselor. ? Phone quitlines. ? Printed self-help materials. ? Support groups or group counseling. ? Text messaging programs. ? Mobile phone apps or applications.  Taking medicines. Some of these medicines may have nicotine in them. If you are pregnant or breastfeeding, do not take any medicines to quit smoking unless your doctor says it is okay. Talk with your doctor about counseling or other things that can help you.  Talk with your doctor about using more than one strategy at the same time, such as taking medicines while you are also going to in-person counseling. This can help make  quitting easier. What things can I do to make it easier to quit? Quitting smoking might feel very hard at first, but there is a lot that you can do to make it easier. Take these steps:  Talk to your family and friends. Ask them to support and encourage you.  Call phone quitlines, reach out to support groups, or work with a counselor.  Ask people who smoke to not smoke around you.  Avoid places that make you want (trigger) to smoke, such as: ? Bars. ? Parties. ? Smoke-break areas at work.  Spend time with people who do not smoke.  Lower the stress in your life. Stress can make you want to smoke. Try these things to help your stress: ? Getting regular exercise. ? Deep-breathing exercises. ? Yoga. ? Meditating. ? Doing a body scan. To do this, close your eyes, focus on one area of your body at a time from head to toe, and notice which parts of your body are tense. Try to relax the muscles in those areas.  Download or buy apps on your mobile phone or tablet that can help you stick to your quit plan. There are many free apps, such as QuitGuide from the CDC (Centers for Disease Control and Prevention). You can find more support from smokefree.gov and other websites.  This information is not intended to replace advice given to you by your health care provider. Make sure you discuss any questions you have with your health care provider. Document Released: 11/19/2008 Document   Revised: 09/21/2015 Document Reviewed: 06/09/2014 Elsevier Interactive Patient Education  2018 Elsevier Inc.  

## 2016-07-25 NOTE — Progress Notes (Signed)
Patient Joanna [name], female DOB:1970-04-07, 46 y.o. PJK:[id number]  Chief Complaint  Patient presents with  . Follow-up    Right Knee    HPI  Joanna [name] is a 46 y.o. female who has chronic pain of the right knee.  She is much improved after the injection last time.  She has little swelling.  She has popping but no giving way.  She is walking better and longer.  She has no new trauma. HPI  Body mass index is 50.52 kg/m.  ROS  Review of Systems  HENT: Negative for congestion.   Cardiovascular: Negative for chest pain and leg swelling.  Endocrine: Positive for cold intolerance.  Musculoskeletal: Positive for arthralgias and gait problem.  Allergic/Immunologic: Positive for environmental allergies.  Psychiatric/Behavioral: The patient is nervous/anxious.     Past Medical History:  Diagnosis Date  . Anxiety    Dx at age 74  . Arthritis    knee  . CAD (coronary artery disease)    a. Inferior STEMI 07/2014 - s/p thrombectomy of RCA, no stenting.  . Depression    at age 87; bipolar recent  . Diabetes mellitus without complication ([place])   . Diabetes type 2, uncontrolled ([place]) Dx 2010  . DJD (degenerative joint disease)   . Gout   . Hyperlipidemia Dx 2010  . Hypertension    Dx at age 21  . IBS (irritable bowel syndrome)   . Interstitial lung disease ([place]) Dx 2011   No biopsy  . Lung disorder Dx 2011  . Morbid obesity ([place])   . Neuropathy   . Non compliance w medication regimen   . PCOS (polycystic ovarian syndrome) 1988   no children   . Sleep apnea   . STEMI (ST elevation myocardial infarction) ([place]) 07/23/14  . Tobacco abuse     Past Surgical History:  Procedure Laterality Date  . CARDIAC CATHETERIZATION N/A 07/23/2014   Procedure: Left Heart Cath and Coronary Angiography;  Surgeon: Peter M Martinique, MD;  Location: [facility];  Service: Cardiovascular;  Laterality: N/A;  . CARDIAC CATHETERIZATION N/A 07/27/2014   Procedure: Left Heart Cath and Coronary  Angiography;  Surgeon: Jettie Booze, MD;  Location: [facility];  Service: Cardiovascular;  Laterality: N/A;  . CARDIAC CATHETERIZATION N/A 07/27/2014   Procedure: Coronary Balloon Angioplasty;  Surgeon: Jettie Booze, MD;  Location: [facility];  Service: Cardiovascular;  Laterality: N/A;  thrombectomy only    Family History  Problem Relation Age of Onset  . Coronary artery disease Father 47       CABG  . Lung cancer Father   . Heart disease Father   . Diabetes Father   . Hypertension Father   . Hyperlipidemia Father   . Drug abuse Father   . Cancer Father        lung  . Lung cancer Mother   . Depression Mother   . Hypertension Mother   . Drug abuse Mother   . Cancer Mother        lung cancer  . Liver disease Brother        alcoholic cirrhosis >  HCC  . COPD Brother   . Asthma Brother   . Depression Brother   . Stroke Brother   . Drug abuse Brother   . Diabetes Sister   . Thyroid disease Sister   . Depression Sister   . Heart disease Sister   . Hypertension Sister   . Cirrhosis Maternal Aunt   .  Cirrhosis Maternal Uncle   . Breast cancer Paternal Aunt   . Cancer Maternal Grandmother   . Emphysema Maternal Grandfather   . COPD Paternal Grandmother   . Rheumatologic disease Neg Hx     Social History Social History  Substance Use Topics  . Smoking status: Current Every Day Smoker    Packs/day: 0.50    Years: 31.00    Types: Cigarettes    Start date: 08/04/1983  . Smokeless tobacco: Never Used     Comment: about 10-15 cigs daily 09/28/14 - peak 2ppd  . Alcohol use No    Allergies  Allergen Reactions  . Wellbutrin [Bupropion] Rash    Current Outpatient Prescriptions  Medication Sig Dispense Refill  . albuterol (PROVENTIL HFA;VENTOLIN HFA) 108 (90 Base) MCG/ACT inhaler Inhale into the lungs every 6 (six) hours as needed for wheezing or shortness of breath.    Marland Kitchen amLODipine (NORVASC) 2.5 MG tablet Take 1 tablet (2.5 mg total) by mouth  daily. (Patient taking differently: Take 5 mg by mouth daily. ) 30 tablet 11  . aspirin EC 81 MG tablet Take 1 tablet (81 mg total) by mouth daily. 30 tablet 11  . baclofen (LIORESAL) 10 MG tablet Take 10 mg by mouth 3 (three) times daily.    . Blood Glucose Monitoring Suppl (TRUE METRIX METER) W/DEVICE KIT 1 each by Does not apply route 3 (three) times daily. 1 kit 0  . gabapentin (NEURONTIN) 300 MG capsule Take 300 mg by mouth 3 (three) times daily.    Marland Kitchen glipiZIDE (GLUCOTROL) 10 MG tablet Take 10 mg by mouth daily before breakfast.    . glucose blood (TRUE METRIX BLOOD GLUCOSE TEST) test strip Use as instructed 100 each 12  . levonorgestrel (MIRENA) 20 MCG/24HR IUD 1 each by Intrauterine route once.     . Liraglutide (VICTOZA [place]) Inject 0.06 Units into the skin.    Marland Kitchen lisinopril-hydrochlorothiazide (PRINZIDE,ZESTORETIC) 20-25 MG tablet Take 1 tablet by mouth daily.    Marland Kitchen lurasidone (LATUDA) 20 MG TABS tablet Take 20 mg by mouth.    . metFORMIN (GLUCOPHAGE) 1000 MG tablet Take 1 tablet (1,000 mg total) by mouth 2 (two) times daily with a meal. 60 tablet 5  . metoprolol (LOPRESSOR) 50 MG tablet Take 1 tablet (50 mg total) by mouth 2 (two) times daily. 60 tablet 6  . nitroGLYCERIN (NITROSTAT) 0.4 MG SL tablet Place 1 tablet (0.4 mg total) under the tongue every 5 (five) minutes as needed for chest pain (up to 3 doses). 25 tablet 3  . pantoprazole (PROTONIX) 40 MG tablet Take 1 tablet (40 mg total) by mouth daily. 30 tablet 3  . pravastatin (PRAVACHOL) 80 MG tablet Take 1 tablet (80 mg total) by mouth every evening. 30 tablet 6  . traZODone (DESYREL) 50 MG tablet Take 50 mg by mouth at bedtime.    . triamcinolone ointment (KENALOG) 0.5 % Apply 1 application topically 2 (two) times daily.    . TRUEPLUS LANCETS 28G MISC 1 each by Does not apply route 3 (three) times daily. 100 each 5  . venlafaxine (EFFEXOR) 75 MG tablet Take 150 mg by mouth 2 (two) times daily.      No current facility-administered  medications for this visit.      Physical Exam  Blood pressure 109/71, pulse 64, temperature 97.3 F (36.3 C), height _0  (1.676 m), weight (!) 313 lb (142 kg).  Constitutional: overall normal hygiene, normal nutrition, well developed, normal grooming, normal body habitus. Assistive  device:none  Musculoskeletal: gait and station Limp none, muscle tone and strength are normal, no tremors or atrophy is present.  .  Neurological: coordination overall normal.  Deep tendon reflex/nerve stretch intact.  Sensation normal.  Cranial nerves II-XII intact.   Skin:   Normal overall no scars, lesions, ulcers or rashes. No psoriasis.  Psychiatric: Alert and oriented x 3.  Recent memory intact, remote memory unclear.  Normal mood and affect. Well groomed.  Good eye contact.  Cardiovascular: overall no swelling, no varicosities, no edema bilaterally, normal temperatures of the legs and arms, no clubbing, cyanosis and good capillary refill.  Lymphatic: palpation is normal.  Her right knee has full motion, crepitus is present and slight effusion, the knee is stable, no limp, no redness, normal strength and tone.  NV intact.  The patient has been educated about the nature of the problem(s) and counseled on treatment options.  The patient appeared to understand what I have discussed and is in agreement with it.  Encounter Diagnoses  Name Primary?  . Chronic pain of right knee Yes  . Cigarette nicotine dependence without complication     PLAN Call if any problems.  Precautions discussed.  Continue current medications.   Return to clinic prn   Electronically Signed Sanjuana Kava, MD 6/19/20188:53 AM

## 2017-01-11 ENCOUNTER — Other Ambulatory Visit (HOSPITAL_COMMUNITY): Payer: Self-pay | Admitting: Internal Medicine

## 2017-01-11 DIAGNOSIS — Z1231 Encounter for screening mammogram for malignant neoplasm of breast: Secondary | ICD-10-CM

## 2017-02-19 ENCOUNTER — Encounter (HOSPITAL_COMMUNITY): Payer: Self-pay

## 2017-02-19 ENCOUNTER — Ambulatory Visit (HOSPITAL_COMMUNITY)
Admission: RE | Admit: 2017-02-19 | Discharge: 2017-02-19 | Disposition: A | Discharge status: Home / Self Care | Payer: Medicaid Other | Source: Ambulatory Visit | Attending: Internal Medicine | Admitting: Internal Medicine

## 2017-02-19 DIAGNOSIS — Z1231 Encounter for screening mammogram for malignant neoplasm of breast: Secondary | ICD-10-CM | POA: Diagnosis present

## 2017-07-17 ENCOUNTER — Ambulatory Visit: Payer: Medicaid Other | Admitting: Cardiovascular Disease

## 2017-07-18 ENCOUNTER — Encounter: Payer: Self-pay | Admitting: Cardiovascular Disease

## 2017-08-29 ENCOUNTER — Other Ambulatory Visit (HOSPITAL_COMMUNITY): Payer: Self-pay | Admitting: Family Medicine

## 2017-08-29 DIAGNOSIS — R1909 Other intra-abdominal and pelvic swelling, mass and lump: Secondary | ICD-10-CM

## 2017-08-29 DIAGNOSIS — R1084 Generalized abdominal pain: Secondary | ICD-10-CM

## 2017-09-14 ENCOUNTER — Ambulatory Visit (HOSPITAL_COMMUNITY)
Admission: RE | Admit: 2017-09-14 | Discharge: 2017-09-14 | Disposition: A | Discharge status: Home / Self Care | Payer: Medicaid Other | Source: Ambulatory Visit | Attending: Family Medicine | Admitting: Family Medicine

## 2017-09-14 ENCOUNTER — Encounter (HOSPITAL_COMMUNITY): Payer: Self-pay

## 2017-09-14 DIAGNOSIS — R1909 Other intra-abdominal and pelvic swelling, mass and lump: Secondary | ICD-10-CM | POA: Diagnosis present

## 2017-09-14 DIAGNOSIS — R918 Other nonspecific abnormal finding of lung field: Secondary | ICD-10-CM | POA: Diagnosis not present

## 2017-09-14 DIAGNOSIS — K5730 Diverticulosis of large intestine without perforation or abscess without bleeding: Secondary | ICD-10-CM | POA: Diagnosis not present

## 2017-09-14 DIAGNOSIS — D1779 Benign lipomatous neoplasm of other sites: Secondary | ICD-10-CM | POA: Diagnosis not present

## 2017-09-14 DIAGNOSIS — R1084 Generalized abdominal pain: Secondary | ICD-10-CM

## 2017-09-14 LAB — POCT I-STAT CREATININE: Creatinine, Ser: 0.9 mg/dL (ref 0.44–1.00)

## 2017-09-14 MED ORDER — IOPAMIDOL (ISOVUE-300) INJECTION 61%
100.0000 mL | Freq: Once | INTRAVENOUS | Status: AC | PRN
  Administered 2017-09-14: 100 mL via INTRAVENOUS

## 2017-09-25 ENCOUNTER — Encounter: Payer: Self-pay | Admitting: Gastroenterology

## 2017-09-26 ENCOUNTER — Emergency Department (HOSPITAL_COMMUNITY): Payer: Medicaid Other

## 2017-09-26 ENCOUNTER — Other Ambulatory Visit: Payer: Self-pay

## 2017-09-26 ENCOUNTER — Encounter (HOSPITAL_COMMUNITY): Payer: Self-pay | Admitting: Emergency Medicine

## 2017-09-26 ENCOUNTER — Emergency Department (HOSPITAL_COMMUNITY)
Admission: EM | Admit: 2017-09-26 | Discharge: 2017-09-26 | Disposition: A | Discharge status: Home / Self Care | Payer: Medicaid Other | Attending: Emergency Medicine | Admitting: Emergency Medicine

## 2017-09-26 DIAGNOSIS — Z79899 Other long term (current) drug therapy: Secondary | ICD-10-CM | POA: Insufficient documentation

## 2017-09-26 DIAGNOSIS — F17210 Nicotine dependence, cigarettes, uncomplicated: Secondary | ICD-10-CM | POA: Diagnosis not present

## 2017-09-26 DIAGNOSIS — I10 Essential (primary) hypertension: Secondary | ICD-10-CM | POA: Diagnosis not present

## 2017-09-26 DIAGNOSIS — J40 Bronchitis, not specified as acute or chronic: Secondary | ICD-10-CM | POA: Diagnosis not present

## 2017-09-26 DIAGNOSIS — Z7984 Long term (current) use of oral hypoglycemic drugs: Secondary | ICD-10-CM | POA: Insufficient documentation

## 2017-09-26 DIAGNOSIS — Z7982 Long term (current) use of aspirin: Secondary | ICD-10-CM | POA: Insufficient documentation

## 2017-09-26 DIAGNOSIS — R1110 Vomiting, unspecified: Secondary | ICD-10-CM | POA: Insufficient documentation

## 2017-09-26 DIAGNOSIS — I252 Old myocardial infarction: Secondary | ICD-10-CM | POA: Insufficient documentation

## 2017-09-26 DIAGNOSIS — I2510 Atherosclerotic heart disease of native coronary artery without angina pectoris: Secondary | ICD-10-CM | POA: Diagnosis not present

## 2017-09-26 DIAGNOSIS — E1140 Type 2 diabetes mellitus with diabetic neuropathy, unspecified: Secondary | ICD-10-CM | POA: Insufficient documentation

## 2017-09-26 DIAGNOSIS — R05 Cough: Secondary | ICD-10-CM | POA: Diagnosis present

## 2017-09-26 LAB — CBG MONITORING, ED: Glucose-Capillary: 139 mg/dL — ABNORMAL HIGH (ref 70–99)

## 2017-09-26 MED ORDER — AZITHROMYCIN 250 MG PO TABS: 250.0000 mg | ORAL_TABLET | Freq: Every day | ORAL | 0 refills | Status: DC

## 2017-09-26 MED ORDER — PREDNISONE 20 MG PO TABS: 40.0000 mg | ORAL_TABLET | Freq: Every day | ORAL | 0 refills | Status: DC

## 2017-09-26 MED ORDER — PREDNISONE 20 MG PO TABS
40.0000 mg | ORAL_TABLET | Freq: Once | ORAL | Status: AC
  Administered 2017-09-26: 40 mg via ORAL
  Filled 2017-09-26: qty 2

## 2017-09-26 NOTE — ED Provider Notes (Signed)
[facility] EMERGENCY DEPARTMENT Provider Note   CSN: [id number] Arrival date & time: 09/26/17  1856     History   Chief Complaint Chief Complaint  Patient presents with  . Cough    HPI Joanna [name] is a 47 y.o. female who presents with a cough and chest pain. PMH significant for CAD, hx of STEMI, Type 2 DM, HTN, HLD, smoking, ILD, obesity.  She states that she has been coughing for the past 2 days.  She reports a sick contact with her nephew who had similar symptoms.  The cough is so intense that it causes headache, chest pain, back and rib pain.  She is coughed so much that she vomits at times.  She tried 1 dose of Mucinex without significant relief so decided to come to the ED.  She also has nasal congestion and sneezing.  No sore throat.  No fever or abdominal pain. She states her most recent A1c was 6.0  HPI  Past Medical History:  Diagnosis Date  . Anxiety    Dx at age 32  . Arthritis    knee  . CAD (coronary artery disease)    a. Inferior STEMI 07/2014 - s/p thrombectomy of RCA, no stenting.  . Depression    at age 47; bipolar recent  . Diabetes mellitus without complication ([place])   . Diabetes type 2, uncontrolled ([place]) Dx 2010  . DJD (degenerative joint disease)   . Gout   . Hyperlipidemia Dx 2010  . Hypertension    Dx at age 5  . IBS (irritable bowel syndrome)   . Interstitial lung disease ([place]) Dx 2011   No biopsy  . Lung disorder Dx 2011  . Morbid obesity ([place])   . Neuropathy   . Non compliance w medication regimen   . PCOS (polycystic ovarian syndrome) 1988   no children   . Sleep apnea   . STEMI (ST elevation myocardial infarction) ([place]) 07/23/14  . Tobacco abuse     Patient Active Problem List   Diagnosis Date Noted  . Chronic pain of right knee 07/11/2016  . Cigarette nicotine dependence without complication 40/98/1191  . Sebaceous cyst of breast, right 03/07/2016  . OSA (obstructive sleep apnea) 10/26/2014  . GERD (gastroesophageal reflux  disease) 10/26/2014  . Agoraphobia 09/28/2014  . Generalized anxiety disorder 09/28/2014  . Screening for HIV (human immunodeficiency virus) 09/15/2014  . Menometrorrhagia 09/15/2014  . PCOS (polycystic ovarian syndrome)   . CAD (coronary artery disease) 07/28/2014  . Leukocytosis 07/28/2014  . Family history of coronary artery disease in father 07/23/2014  . ST elevation myocardial infarction (STEMI) involving right coronary artery with complication ([place]) 47/82/9562  . Interstitial lung disease ([place])   . Uncontrolled diabetes mellitus ([place])   . Hypertension   . Morbid obesity ([place])   . Non compliance w medication regimen   . Depression   . Smoker     Past Surgical History:  Procedure Laterality Date  . CARDIAC CATHETERIZATION N/A 07/23/2014   Procedure: Left Heart Cath and Coronary Angiography;  Surgeon: Peter M Martinique, MD;  Location: [place] [facility];  Service: Cardiovascular;  Laterality: N/A;  . CARDIAC CATHETERIZATION N/A 07/27/2014   Procedure: Left Heart Cath and Coronary Angiography;  Surgeon: Jettie Booze, MD;  Location: [place] [facility];  Service: Cardiovascular;  Laterality: N/A;  . CARDIAC CATHETERIZATION N/A 07/27/2014   Procedure: Coronary Balloon Angioplasty;  Surgeon: Jettie Booze, MD;  Location: [place] [facility];  Service: Cardiovascular;  Laterality: N/A;  thrombectomy only     OB History   None      Home Medications    Prior to Admission medications   Medication Sig Start Date End Date Taking? Authorizing Provider  albuterol (PROVENTIL HFA;VENTOLIN HFA) 108 (90 Base) MCG/ACT inhaler Inhale into the lungs every 6 (six) hours as needed for wheezing or shortness of breath.    [provider]  amLODipine (NORVASC) 2.5 MG tablet Take 1 tablet (2.5 mg total) by mouth daily. Patient taking differently: Take 5 mg by mouth daily.  08/04/14   Lendon Colonel, NP  aspirin EC 81 MG tablet Take 1 tablet (81 mg total) by mouth daily. 07/28/14    Dunn, Nedra Hai, PA-C  baclofen (LIORESAL) 10 MG tablet Take 10 mg by mouth 3 (three) times daily.    [provider]  Blood Glucose Monitoring Suppl (TRUE METRIX METER) W/DEVICE KIT 1 each by Does not apply route 3 (three) times daily. 08/03/14   Charlott Rakes, MD  gabapentin (NEURONTIN) 300 MG capsule Take 300 mg by mouth 3 (three) times daily.    [provider]  glipiZIDE (GLUCOTROL) 10 MG tablet Take 10 mg by mouth daily before breakfast.    [provider]  glucose blood (TRUE METRIX BLOOD GLUCOSE TEST) test strip Use as instructed 08/03/14   Charlott Rakes, MD  levonorgestrel (MIRENA) 20 MCG/24HR IUD 1 each by Intrauterine route once.  08/28/12   [provider]  Liraglutide (VICTOZA Sunflower) Inject 0.06 Units into the skin.    [provider]  lisinopril-hydrochlorothiazide (PRINZIDE,ZESTORETIC) 20-25 MG tablet Take 1 tablet by mouth daily.    [provider]  lurasidone (LATUDA) 20 MG TABS tablet Take 20 mg by mouth.    [provider]  metFORMIN (GLUCOPHAGE) 1000 MG tablet Take 1 tablet (1,000 mg total) by mouth 2 (two) times daily with a meal. 09/15/14   Funches, Josalyn, MD  metoprolol (LOPRESSOR) 50 MG tablet Take 1 tablet (50 mg total) by mouth 2 (two) times daily. 07/28/14   Dunn, Nedra Hai, PA-C  nitroGLYCERIN (NITROSTAT) 0.4 MG SL tablet Place 1 tablet (0.4 mg total) under the tongue every 5 (five) minutes as needed for chest pain (up to 3 doses). 07/28/14   Dunn, Nedra Hai, PA-C  pantoprazole (PROTONIX) 40 MG tablet Take 1 tablet (40 mg total) by mouth daily. 09/28/14   Javier Glazier, MD  pravastatin (PRAVACHOL) 80 MG tablet Take 1 tablet (80 mg total) by mouth every evening. 07/28/14   Dunn, Nedra Hai, PA-C  traZODone (DESYREL) 50 MG tablet Take 50 mg by mouth at bedtime.    [provider]  triamcinolone ointment (KENALOG) 0.5 % Apply 1 application topically 2 (two) times daily.    [provider]  TRUEPLUS  LANCETS 28G MISC 1 each by Does not apply route 3 (three) times daily. 09/03/14   Charlott Rakes, MD  venlafaxine (EFFEXOR) 75 MG tablet Take 150 mg by mouth 2 (two) times daily.     [provider]    Family History Family History  Problem Relation Age of Onset  . Coronary artery disease Father 4       CABG  . Lung cancer Father   . Heart disease Father   . Diabetes Father   . Hypertension Father   . Hyperlipidemia Father   . Drug abuse Father   . Cancer Father        lung  . Lung cancer Mother   .  Depression Mother   . Hypertension Mother   . Drug abuse Mother   . Cancer Mother        lung cancer  . Liver disease Brother        alcoholic cirrhosis >  HCC  . COPD Brother   . Asthma Brother   . Depression Brother   . Stroke Brother   . Drug abuse Brother   . Diabetes Sister   . Thyroid disease Sister   . Depression Sister   . Heart disease Sister   . Hypertension Sister   . Cirrhosis Maternal Aunt   . Cirrhosis Maternal Uncle   . Breast cancer Paternal Aunt   . Cancer Maternal Grandmother   . Emphysema Maternal Grandfather   . COPD Paternal Grandmother   . Rheumatologic disease Neg Hx     Social History Social History   Tobacco Use  . Smoking status: Current Every Day Smoker    Packs/day: 0.50    Years: 31.00    Pack years: 15.50    Types: Cigarettes    Start date: 08/04/1983  . Smokeless tobacco: Never Used  . Tobacco comment: about 10-15 cigs daily 09/28/14 - peak 2ppd  Substance Use Topics  . Alcohol use: No    Alcohol/week: 0.0 standard drinks  . Drug use: No     Allergies   Wellbutrin [bupropion]   Review of Systems Review of Systems  Constitutional: Negative for chills and fever.  HENT: Positive for congestion and sneezing. Negative for ear pain, rhinorrhea and sore throat.   Respiratory: Positive for cough and shortness of breath (With coughing). Negative for wheezing.   Cardiovascular: Positive for chest pain (With coughing).    Gastrointestinal: Positive for vomiting (Posttussive). Negative for abdominal pain and nausea.     Physical Exam Updated Vital Signs BP (!) 157/67 (BP Location: Right Arm)   Pulse 77   Temp 99.3 F (37.4 C) (Oral)   Resp 20   LMP  (LMP Unknown)   SpO2 93%   Physical Exam  Constitutional: She is oriented to person, place, and time. She appears well-developed and well-nourished. No distress.  Morbidly obese, hirsute female.  She appears mildly ill.  Frequent coughing  HENT:  Head: Normocephalic and atraumatic.  Right Ear: Hearing, tympanic membrane, external ear and ear canal normal.  Left Ear: Hearing, tympanic membrane, external ear and ear canal normal.  Nose: Nose normal.  Mouth/Throat: Uvula is midline, oropharynx is clear and moist and mucous membranes are normal.  Eyes: Pupils are equal, round, and reactive to light. Conjunctivae are normal. Right eye exhibits no discharge. Left eye exhibits no discharge. No scleral icterus.  Neck: Normal range of motion.  Cardiovascular: Normal rate and regular rhythm.  Pulmonary/Chest: Effort normal. No stridor. No respiratory distress. She has wheezes (Faint upper lobe wheezes which clears with coughing). She has no rales. She exhibits no tenderness.  Abdominal: She exhibits no distension.  Neurological: She is alert and oriented to person, place, and time.  Skin: Skin is warm and dry.  Psychiatric: She has a normal mood and affect. Her behavior is normal.  Nursing note and vitals reviewed.    ED Treatments / Results  Labs (all labs ordered are listed, but only abnormal results are displayed) Labs Reviewed  CBG MONITORING, ED - Abnormal; Notable for the following components:      Result Value   Glucose-Capillary 139 (*)    All other components within normal limits    EKG None  Radiology Dg Chest 2 View  Result Date: 09/26/2017 CLINICAL DATA:  Cough and shortness of breath. EXAM: CHEST - 2 VIEW COMPARISON:  July 13, 2015  chest radiograph and chest CT October 06, 2014 FINDINGS: Diffuse reticular interstitial prominence is stable. There is no evident edema or consolidation. Heart size and pulmonary vascularity are normal. No adenopathy. No bone lesions. IMPRESSION: Chronic reticular interstitial disease, stable. Etiology uncertain. Suspect chronic inflammatory type change. No frank edema or consolidation. Stable cardiac silhouette. Electronically Signed   By: Lowella Grip III M.D.   On: 09/26/2017 20:23    Procedures Procedures (including critical care time)  Medications Ordered in ED Medications - No data to display   Initial Impression / Assessment and Plan / ED Course  I have reviewed the triage vital signs and the nursing notes.  Pertinent labs & imaging results that were available during my care of the patient were reviewed by me and considered in my medical decision making (see chart for details).  48 year old female presents with cough and congestion for the past 2 days.  She reports multiple other symptoms such as headache, chest pain, vomiting however she relates that this is all secondary to coughing frequently.  She is mildly hypertensive but otherwise vital signs are normal.  Due to her past medical history we will obtain EKG and chest x-ray although exam is consistent with bronchitis.  Her EKG is sinus rhythm.  Her chest x-ray shows chronic changes without acute infiltrate.  She reports that her A1c is 6 and her diabetes is well controlled.  Risks versus benefits of steroid use was discussed with her.  We will give her a short course of steroid and Z-Pak.  She has inhalers at home.  She is advised to follow-up with her primary care provider and return if worsening.  Final Clinical Impressions(s) / ED Diagnoses   Final diagnoses:  Bronchitis    ED Discharge Orders    None       Recardo Evangelist, PA-C 09/26/17 2104    Francine Graven, DO 09/29/17 1237

## 2017-09-26 NOTE — ED Triage Notes (Signed)
Pt c/o cough x 2 days. C/o prod cough. States making her head and chest hurts. Took mucinex earlier. Lung sounds clear in triage

## 2017-09-26 NOTE — Discharge Instructions (Addendum)
Use inhaler as needed for shortness of breath or wheezing Take prednisone once daily for the next 4 days. Please check you sugars closely Start Z-pack Follow up with your doctor

## 2017-12-28 ENCOUNTER — Encounter: Payer: Self-pay | Admitting: Gastroenterology

## 2017-12-28 ENCOUNTER — Telehealth: Payer: Self-pay | Admitting: Gastroenterology

## 2017-12-28 ENCOUNTER — Ambulatory Visit: Payer: Medicaid Other | Admitting: Gastroenterology

## 2017-12-28 NOTE — Telephone Encounter (Signed)
PATIENT WAS A NO SHOW AND LETTER SENT  °

## 2018-09-25 ENCOUNTER — Encounter: Payer: Self-pay | Admitting: Internal Medicine

## 2018-10-09 ENCOUNTER — Other Ambulatory Visit: Payer: Medicaid Other | Admitting: Obstetrics and Gynecology

## 2018-10-22 ENCOUNTER — Ambulatory Visit: Payer: Medicaid Other | Admitting: Nurse Practitioner

## 2018-10-29 NOTE — Progress Notes (Signed)
Referring Provider: Denyce Robert, [place] Primary Care Physician:  Jani Gravel, MD Primary Gastroenterologist:  Dr. Gala Romney  Chief Complaint  Patient presents with  . Gastroesophageal Reflux  . Colonoscopy    never had tcs  . Constipation  . Diarrhea    HPI:   Joanna [name] is a 48 y.o. female presenting today at the request of Denyce Robert, [place] for GERD. Patient has chief complaint of epigastric pain, nausea/vomiting, chronic constipation, and wanting to discuss scheduling colonoscopy.    GERD: Chronic. Present for years. Protonix 40 mg daily. Symptoms well controlled. If she eats something tomato based, then she will have breakthrough. No dysphagia. No black stools. Reports abdominal swelling/hardening after meals, mostly in the epigastric area. This has been present for over 1 year. Not significantly changed. Sometimes worse with constipation. Reports new onset epigastric pain over the last 1-2 months. Often occurs with the abdominal distention after meals. May be present aside from meals. No specific food triggers. Also reports a bulge in her upper abdomen that is tender. Has been present for at least 1 year. CT last year did not reveal anything specific. No NSAIDs. If she needs something, she will take Tylenol.   Constipation: Chronic.  Reports history of IBS. Will have lower abdominal pain, more on the sides, when she is constipated. Pain is improved after a good BM. Typically BMs every 2-4 days. Stools hard at times. Occasional straining. Reports taking stool softener in the past. Never tried MiraLAX. Rare diarrhea. No bright red blood per rectum. Never had a colonoscopy.   Doesn't eat many fruits and vegetables. Drinks a lot of soda. Tries to drink 1-2 bottles of water a day. No regular exercise.   Occasional nausea. Once or twice a week. Present for the last couple of months. May vomit once a week. Will come on out of the blue. Not specifically associated with epigastric pain.  Feels like she gets full quickly. Weight is up and down. No recent weight loss.   Denies fever, chills.  Admits to occasional chest pain in middle and on left side of her chest. Last 10 -15 minutes.  She reports this is chronic and has not changed significantly. No heart palpitations. Swelling in LE at times.  She has not seen cardiology since 2018.  Admits to some shortness of breath with walking but none at rest.  Admits to occasional cough.   Past Medical History:  Diagnosis Date  . Anxiety    Dx at age 55  . Arthritis    knee  . CAD (coronary artery disease)    a. Inferior STEMI 07/2014 - s/p thrombectomy of RCA, no stenting.  . Depression    at age 64; bipolar recent  . Diabetes mellitus without complication ([place])   . Diabetes type 2, uncontrolled ([place]) Dx 2010  . DJD (degenerative joint disease)   . Gout   . Hyperlipidemia Dx 2010  . Hypertension    Dx at age 56  . IBS (irritable bowel syndrome)   . Interstitial lung disease ([place]) Dx 2011   No biopsy  . Lung disorder Dx 2011  . Morbid obesity ([place])   . Neuropathy   . Non compliance w medication regimen   . PCOS (polycystic ovarian syndrome) 1988   no children   . Sleep apnea   . STEMI (ST elevation myocardial infarction) ([place]) 07/23/14  . Tobacco abuse     Past Surgical History:  Procedure Laterality Date  . CARDIAC CATHETERIZATION N/A 07/23/2014  Procedure: Left Heart Cath and Coronary Angiography;  Surgeon: Peter M Martinique, MD;  Location: [facility];  Service: Cardiovascular;  Laterality: N/A;  . CARDIAC CATHETERIZATION N/A 07/27/2014   Procedure: Left Heart Cath and Coronary Angiography;  Surgeon: Jettie Booze, MD;  Location: [facility];  Service: Cardiovascular;  Laterality: N/A;  . CARDIAC CATHETERIZATION N/A 07/27/2014   Procedure: Coronary Balloon Angioplasty;  Surgeon: Jettie Booze, MD;  Location: [facility];  Service: Cardiovascular;  Laterality: N/A;  thrombectomy only     Current Outpatient Medications  Medication Sig Dispense Refill  . albuterol (PROVENTIL HFA;VENTOLIN HFA) 108 (90 Base) MCG/ACT inhaler Inhale into the lungs every 6 (six) hours as needed for wheezing or shortness of breath.    Marland Kitchen amLODipine (NORVASC) 2.5 MG tablet Take 1 tablet (2.5 mg total) by mouth daily. (Patient taking differently: Take 5 mg by mouth daily. ) 30 tablet 11  . aspirin EC 81 MG tablet Take 1 tablet (81 mg total) by mouth daily. 30 tablet 11  . baclofen (LIORESAL) 10 MG tablet Take 10 mg by mouth 3 (three) times daily.    . Blood Glucose Monitoring Suppl (TRUE METRIX METER) W/DEVICE KIT 1 each by Does not apply route 3 (three) times daily. 1 kit 0  . gabapentin (NEURONTIN) 600 MG tablet Take 600 mg by mouth 3 (three) times daily.    Marland Kitchen glipiZIDE (GLUCOTROL) 10 MG tablet Take 10 mg by mouth daily before breakfast.    . glucose blood (TRUE METRIX BLOOD GLUCOSE TEST) test strip Use as instructed 100 each 12  . insulin detemir (LEVEMIR) 100 UNIT/ML injection Inject 10 Units into the skin daily.    Marland Kitchen levonorgestrel (MIRENA) 20 MCG/24HR IUD 1 each by Intrauterine route once.     . Liraglutide (VICTOZA ) Inject 0.06 Units into the skin.    Marland Kitchen lisinopril-hydrochlorothiazide (PRINZIDE,ZESTORETIC) 20-25 MG tablet Take 1 tablet by mouth daily.    . metFORMIN (GLUCOPHAGE) 1000 MG tablet Take 1 tablet (1,000 mg total) by mouth 2 (two) times daily with a meal. 60 tablet 5  . metoprolol (LOPRESSOR) 50 MG tablet Take 1 tablet (50 mg total) by mouth 2 (two) times daily. 60 tablet 6  . nitroGLYCERIN (NITROSTAT) 0.4 MG SL tablet Place 1 tablet (0.4 mg total) under the tongue every 5 (five) minutes as needed for chest pain (up to 3 doses). 25 tablet 3  . pantoprazole (PROTONIX) 40 MG tablet Take 1 tablet (40 mg total) by mouth daily. 30 tablet 3  . traZODone (DESYREL) 50 MG tablet Take 50 mg by mouth at bedtime.    . TRUEPLUS LANCETS 28G MISC 1 each by Does not apply route 3 (three) times daily. 100  each 5  . venlafaxine (EFFEXOR) 75 MG tablet Take 150 mg by mouth daily.     . ondansetron (ZOFRAN) 4 MG tablet Take 1 tablet (4 mg total) by mouth every 8 (eight) hours as needed for nausea or vomiting. 30 tablet 0   No current facility-administered medications for this visit.     Allergies as of 10/30/2018 - Review Complete 10/30/2018  Allergen Reaction Noted  . Wellbutrin [bupropion] Rash 03/03/2014    Family History  Problem Relation Age of Onset  . Coronary artery disease Father 21       CABG  . Lung cancer Father   . Heart disease Father   . Diabetes Father   . Hypertension Father   . Hyperlipidemia Father   .  Drug abuse Father   . Cancer Father        lung  . Lung cancer Mother   . Depression Mother   . Hypertension Mother   . Drug abuse Mother   . Cancer Mother        lung cancer  . Liver disease Brother        alcoholic cirrhosis >  HCC  . COPD Brother   . Asthma Brother   . Depression Brother   . Stroke Brother   . Drug abuse Brother   . Diabetes Sister   . Thyroid disease Sister   . Depression Sister   . Heart disease Sister   . Hypertension Sister   . Cirrhosis Maternal Aunt   . Cirrhosis Maternal Uncle   . Breast cancer Paternal Aunt   . Cancer Maternal Grandmother   . Emphysema Maternal Grandfather   . COPD Paternal Grandmother   . Rheumatologic disease Neg Hx   . Colon cancer Neg Hx   . Colon polyps Neg Hx     Social History   Socioeconomic History  . Marital status: Single    Spouse name: Not on file  . Number of children: 0  . Years of education: Not on file  . Highest education level: Not on file  Occupational History  . Occupation: unemployed  Social Needs  . Financial resource strain: Not on file  . Food insecurity    Worry: Not on file    Inability: Not on file  . Transportation needs    Medical: Not on file    Non-medical: Not on file  Tobacco Use  . Smoking status: Current Every Day Smoker    Packs/day: 0.50    Years:  31.00    Pack years: 15.50    Types: Cigarettes    Start date: 08/04/1983  . Smokeless tobacco: Never Used  . Tobacco comment: about 10-15 cigs daily 09/28/14 - peak 2ppd  Substance and Sexual Activity  . Alcohol use: No    Alcohol/week: 0.0 standard drinks  . Drug use: No  . Sexual activity: Never    Birth control/protection: I.U.D.  Lifestyle  . Physical activity    Days per week: Not on file    Minutes per session: Not on file  . Stress: Not on file  Relationships  . Social Herbalist on phone: Not on file    Gets together: Not on file    Attends religious service: Not on file    Active member of club or organization: Not on file    Attends meetings of clubs or organizations: Not on file    Relationship status: Not on file  . Intimate partner violence    Fear of current or ex partner: Not on file    Emotionally abused: Not on file    Physically abused: Not on file    Forced sexual activity: Not on file  Other Topics Concern  . Not on file  Social History Narrative   Originally from [place], [place]. She has only lived in [place]. She moved to [facility] in 04/19/2014 after her brother died. Currently lives with her sister. She has traveled to [place], [place], [place], [place], [place], [place]. Previously has worked as a Corporate investment banker, in [employer], & also in Data processing manager. She has not been able to keep a job because of her panic attacks. No international travel. Currently has a dog & cat in her residence. No bird,  mold, or hot tub exposure. No asbestos exposure.     Review of Systems: Gen: Admits to lightheadedness with position changes.  Last a couple seconds. HEENT: Denies nasal congestion or sore throat. CV: See HPI Resp: See HPI GI: See HPI GU : Denies urinary burning, urinary frequency, urinary hesitancy MS: Admits to back, knees, and hip pain.  Derm: Reports rash about 1 week ago that went away. On arms and face. Lasted for 4-5 days.  Advise she follow-up with her primary care on this.  Psych: Admits to depression, anxiety but feels her medications are working well. Heme: Denies bruising, bleeding  Physical Exam: BP (!) 135/50   Pulse 67   Temp (!) 96.9 F (36.1 C) (Temporal)   Ht 5' 5"  (1.651 m)   Wt (!) 336 lb (152.4 kg)   BMI 55.91 kg/m  General:   Alert and oriented. Pleasant and cooperative. Well-nourished and well-developed.  Head:  Normocephalic and atraumatic. Eyes:  Without icterus, sclera clear and conjunctiva pink.  Ears:  Normal auditory acuity. Nose:  No deformity, discharge,  or lesions. Lungs:  Clear to auscultation bilaterally. No wheezes, rales, or rhonchi. No distress.  Heart: Heart sounds are distant.  S1, S2 present without murmurs appreciated.  Abdomen:  +BS. Upper abdomen feels tight/firm and is moderately tender to palpation greatest in epigastric area and extending over to LUQ. Soft abdominal protrusion/mass present in the midline above umbilicus. Patient has significant tenderness to palpation of the mass. Can not appreciate if this is a hernia or other mass. Lower abdomen is soft with mild tenderness to palpation in the LLQ and minimal tenderness in the RLQ. No rebound.  Rectal:  Deferred  Msk:  Symmetrical without gross deformities. Normal posture. Extremities:  With minimal LE edema. Neurologic:  Alert and  oriented x4;  grossly normal neurologically. Skin:  No rashes. Significant dry skin with flaking on the lower extremities bilaterally. Psych: Normal mood and affect.

## 2018-10-30 ENCOUNTER — Telehealth: Payer: Self-pay | Admitting: *Deleted

## 2018-10-30 ENCOUNTER — Ambulatory Visit: Payer: Medicaid Other | Admitting: Gastroenterology

## 2018-10-30 ENCOUNTER — Encounter: Payer: Self-pay | Admitting: Gastroenterology

## 2018-10-30 ENCOUNTER — Other Ambulatory Visit: Payer: Self-pay

## 2018-10-30 VITALS — BP 135/50 | HR 67 | Temp 96.9°F | Ht 65.0 in | Wt 336.0 lb

## 2018-10-30 DIAGNOSIS — R1110 Vomiting, unspecified: Secondary | ICD-10-CM | POA: Insufficient documentation

## 2018-10-30 DIAGNOSIS — R1013 Epigastric pain: Secondary | ICD-10-CM | POA: Diagnosis not present

## 2018-10-30 DIAGNOSIS — R112 Nausea with vomiting, unspecified: Secondary | ICD-10-CM | POA: Diagnosis not present

## 2018-10-30 DIAGNOSIS — R1909 Other intra-abdominal and pelvic swelling, mass and lump: Secondary | ICD-10-CM | POA: Diagnosis not present

## 2018-10-30 DIAGNOSIS — K5900 Constipation, unspecified: Secondary | ICD-10-CM | POA: Diagnosis not present

## 2018-10-30 DIAGNOSIS — Z1211 Encounter for screening for malignant neoplasm of colon: Secondary | ICD-10-CM | POA: Insufficient documentation

## 2018-10-30 DIAGNOSIS — K219 Gastro-esophageal reflux disease without esophagitis: Secondary | ICD-10-CM

## 2018-10-30 LAB — CBC WITH DIFFERENTIAL/PLATELET
Absolute Monocytes: 836 cells/uL (ref 200–950)
Basophils Absolute: 115 cells/uL (ref 0–200)
Basophils Relative: 0.7 %
Eosinophils Absolute: 180 cells/uL (ref 15–500)
Eosinophils Relative: 1.1 %
HCT: 42.5 % (ref 35.0–45.0)
Hemoglobin: 13.5 g/dL (ref 11.7–15.5)
Lymphs Abs: 3477 cells/uL (ref 850–3900)
MCH: 26.4 pg — ABNORMAL LOW (ref 27.0–33.0)
MCHC: 31.8 g/dL — ABNORMAL LOW (ref 32.0–36.0)
MCV: 83 fL (ref 80.0–100.0)
MPV: 12.4 fL (ref 7.5–12.5)
Monocytes Relative: 5.1 %
Neutro Abs: 11792 cells/uL — ABNORMAL HIGH (ref 1500–7800)
Neutrophils Relative %: 71.9 %
Platelets: 328 10*3/uL (ref 140–400)
RBC: 5.12 10*6/uL — ABNORMAL HIGH (ref 3.80–5.10)
RDW: 13.5 % (ref 11.0–15.0)
Total Lymphocyte: 21.2 %
WBC: 16.4 10*3/uL — ABNORMAL HIGH (ref 3.8–10.8)

## 2018-10-30 LAB — BASIC METABOLIC PANEL WITH GFR
BUN: 13 mg/dL (ref 7–25)
CO2: 30 mmol/L (ref 20–32)
Calcium: 9 mg/dL (ref 8.6–10.4)
Chloride: 98 mmol/L (ref 98–110)
Creat: 0.89 mg/dL (ref 0.50–1.05)
GFR, Est African American: 88 mL/min/{1.73_m2} (ref >=60)
GFR, Est Non African American: 76 mL/min/{1.73_m2} (ref >=60)
Glucose, Bld: 128 mg/dL — ABNORMAL HIGH (ref 65–99)
Potassium: 4.7 mmol/L (ref 3.5–5.3)
Sodium: 136 mmol/L (ref 135–146)

## 2018-10-30 LAB — LIPASE: Lipase: 29 U/L (ref 7–60)

## 2018-10-30 MED ORDER — ONDANSETRON HCL 4 MG PO TABS: 4.0000 mg | ORAL_TABLET | Freq: Three times a day (TID) | ORAL | 0 refills | Status: DC | PRN

## 2018-10-30 NOTE — Assessment & Plan Note (Addendum)
48 y.o. female presenting with history of GERD well controlled on protonix 40 mg daily and intermittent abdominal distension/hardening after meals now with epigastric pain, nausea and vomiting, and early satiety for the last couple of months. Epigastric pain often occurs in the setting of abdominal distension after meals. No specific food triggers identified. Occasionally worse with constipation, but doesn't necessarily resolve after a BM.  Nausea is random a few times a week with vomiting about once a week. No NSAIDs. No brbpr, melena, fever, or recent weight loss.  On exam, the upper abdomen feels significantly more firm than the lower abdomen with moderate tenderness to palpation greatest in the epigastric area and over to the LUQ. She also has a midline abdominal mass above the umbilicus.  This mass has been present for over 1 year; however, she had significant tenderness to palpation of the mass today. Hard to appreciate what this is. CT in August 2019 reports edema within the subcutaneous fat of the anterior abdominal wall and back.  No focal fluid collection, mass or hernia demonstrated.  No ascites.  The liver, gallbladder, pancreas, and spleen were normal. Recent labs on 09/12/18: CBC with slightly elevated WBC 12.3 otherwise normal. CMP with normal liver function tests.   Differentials include gastritis, duodenitis, ? H. Pylori, malignancy, etiology related to abdominal mass, biliary etiology, or pancreatitis. Suspect nausea/vomiting are related to the underlying process causing epigastric pain.  Update CBC, BMP, and obtain Lipase Obtain CT abdomen and pelvis to evaluate tender abdominal mass as well as upper abdominal pain with increased firmness on exam. Query whether there is a worsening underlying inflammatory process related to the mass as edema was mentioned on prior CT.  With new onset of epigastric pain, nausea and vomiting, and early satiety with well controlled GERD, patient will need EGD for  further evaluation unless etiology is identified on above workup. Will go ahead and hold spot for EGD with propofol with planned screening TCS.  Zofran 4 mg Q8hrs as needed for nausea and vomiting.  Continue Protonix 40 mg daily. Follow-up in 3-4 months.

## 2018-10-30 NOTE — Assessment & Plan Note (Addendum)
48 y.o female who has never had screening colonoscopy. Asking to schedule at this time. Lower GI symptoms significant for chronic constipation with associated lower abdominal pain that improves after BMs. Reports history of IBS.  Has not taken anything regularly for constipation.. No brbpr or melena.  Exam with mild tenderness to palpation in the LLQ and minimal tenderness in the RLQ. She does likely have an acute process involving the upper GI tract as described below which will likely also need EGD for further evaluation.   Suspect lower abdominal pain is related to inadequately controlled constipation. Advised if lower abdominal pain does not improve as constipation improves or if it worsens, she should call us back for further evaluation prior to colonoscopy. Start MiraLAX 1 capful (17 g) daily for constipation.  Patient to call and let me know how this works. Add Metamucil or Benefiber daily. Hold a spot for TCS with propofol with Dr. Gala Romney.  I have requested patient see cardiology prior to scheduling as she reports intermittent chest pain.  Although she reports this is chronic since her STEMI in 2016 and unchanged, she has not seen cardiology since 2018.  If no further evaluation is needed with cardiology, proceed with TCS as planned. The risks, benefits, and alternatives have been discussed in detail with patient. They have stated understanding and desire to proceed.

## 2018-10-30 NOTE — Patient Instructions (Addendum)
Please have CT of your abdomen and pelvis completed.  Please have labs drawn.  Please call cardiology and schedule an appointment ASAP for follow-up of your intermittent chest pain.  We will hold a spot for an upper endoscopy and colonoscopy with Dr. Gala Romney in the meantime. 1 day prior to procedure Take metformin as prescribed. One half dose of Victoza 1/2 tablet of glipizide 1/2 dose of Levemir. Hold diabetes medications the morning of the procedure  Please start taking MiraLAX 1 capful (17 g) daily for constipation.  Please call and let me know how this works.  Please add daily fiber supplement.  Metamucil or Benefiber are good choices.  Start with low-dose and increase slowly.  These may increase gas or bloating.  If they cause worsening abdominal discomfort you may discontinue.  Please call and let us know what cholesterol medication you are taking.   Continue taking Protonix 40 mg daily.  I have sent Zofran to your pharmacy.  May take 1 tablet every 8 hours as needed for nausea and vomiting.  We will plan to see you back in 3-4 months.  Hopefully you will have seen cardiology and also be able to have your procedures by then.  Call if you have questions or concerns or if your symptoms worsen in the meantime.  Aliene Altes, PA-C [facility]

## 2018-10-30 NOTE — Telephone Encounter (Signed)
PA submitted via evicore for CT A/P. It is pending review and will need clinicals faxed. Service order# [id number]. Joanna [name] is aware

## 2018-10-30 NOTE — Assessment & Plan Note (Signed)
Chronic. Symptoms well controlled on Protonix 40 mg daily. Continue present medications and avoid known triggers.

## 2018-10-30 NOTE — Assessment & Plan Note (Signed)
Addressed under abdominal pain, epigastric. 

## 2018-10-30 NOTE — Assessment & Plan Note (Signed)
Chronic constipation with associated lower abdominal pain that improves after BMs. Reports history of IBS.  Has not taken anything regularly for constipation.. No brbpr or melena.  Exam with mild tenderness to palpation in the LLQ and minimal tenderness in the RLQ. Suspect lower abdominal pain is secondary to inadequately managed constipation. Discussed starting prescriptive agent vs MiraLAX. Patient would like to try MiraLAX first.  Start MiraLAX 1 capful (17 g) daily for constipation.  Patient to call let me know how this works. Start Benefiber or Metamucil daily.  Start at low-dose and increase slowly. Advised patient to call our office back if her pain does not improve as constipation improves or if her pain worsens. Plan to follow-up in 3 to 4 months.

## 2018-10-31 NOTE — Telephone Encounter (Signed)
Clinicals faxed

## 2018-11-01 NOTE — Telephone Encounter (Signed)
Checked PA, still pending auth

## 2018-11-04 NOTE — Telephone Encounter (Signed)
LMOVM

## 2018-11-04 NOTE — Telephone Encounter (Signed)
PA approved. Auth# [id number] Dates 10/30/2018-04/28/2019  CT scheduled for 10/2 at 12:00pm, arrival 11:45am, npo 4 hrs prior, p/u oral contrast from [facility] prior.

## 2018-11-05 NOTE — Telephone Encounter (Signed)
Patient called back and is aware of CT appt details. She voiced understanding

## 2018-11-05 NOTE — Telephone Encounter (Signed)
LMOVM for pt 

## 2018-11-08 ENCOUNTER — Other Ambulatory Visit: Payer: Self-pay

## 2018-11-08 ENCOUNTER — Ambulatory Visit (HOSPITAL_COMMUNITY)
Admission: RE | Admit: 2018-11-08 | Discharge: 2018-11-08 | Disposition: A | Discharge status: Home / Self Care | Payer: Medicaid Other | Source: Ambulatory Visit | Attending: Gastroenterology | Admitting: Gastroenterology

## 2018-11-08 DIAGNOSIS — R1909 Other intra-abdominal and pelvic swelling, mass and lump: Secondary | ICD-10-CM | POA: Insufficient documentation

## 2018-11-08 DIAGNOSIS — R112 Nausea with vomiting, unspecified: Secondary | ICD-10-CM | POA: Diagnosis present

## 2018-11-08 DIAGNOSIS — R1013 Epigastric pain: Secondary | ICD-10-CM | POA: Diagnosis present

## 2018-11-08 MED ORDER — IOHEXOL 300 MG/ML  SOLN
100.0000 mL | Freq: Once | INTRAMUSCULAR | Status: AC | PRN
  Administered 2018-11-08: 12:00:00 100 mL via INTRAVENOUS

## 2018-11-11 ENCOUNTER — Other Ambulatory Visit: Payer: Self-pay

## 2018-11-11 ENCOUNTER — Telehealth: Payer: Self-pay

## 2018-11-11 DIAGNOSIS — R109 Unspecified abdominal pain: Secondary | ICD-10-CM

## 2018-11-11 NOTE — Telephone Encounter (Signed)
Spoke with pt. Pt will update labs, lab orders placed. Pt is going to f/u with her PCP. Pt forgot to make an appointment with her cardiologist. Pt will make an appointment when she gets home today. Pt is bloated and fells her abdomen is swollen all the time. The pain seems to come and go and pt's nausea hasn't been bad within the last week per pt. When pt's abdominal pain starts, she doesn't want to eat much. Pt forgot to mention that she is belching a lot and the smell is bad. Pt states the belching smells like passed gas.

## 2018-11-11 NOTE — Telephone Encounter (Signed)
T/C from [facility] with [facility] calling with report on CT done on 11/08/2018.  Forwarding to [employer], PA who ordered. Please note #3 impression.

## 2018-11-11 NOTE — Telephone Encounter (Signed)
Lmom, waiting on a return call.  

## 2018-11-11 NOTE — Telephone Encounter (Signed)
Joanna [name], can you let patient know CT is without explanation of epigastric pain. Mild prominence of part of her liver. I would like to go ahead and get HFP as her LFTs haven't been checked in several years. If normal, we will plan to follow up on this when we see her in office. Her spleen and pancreas look good. She does have a new left renal lesion that likely needs further evaluation with MRI as renal cell carcinoma can not be excluded. Recommended timing of imaging by radiology was 6-12 months or possibly more acutely. Patient will need to follow-up with her PCP on this. We need to fax results to PCP so they can follow- up on this.   How is patient doing? Epigastric pain? Bloating? Nausea? Has she started MiraLAX and fiber supplement? Has constipation improved? Has she scheduled an appointment with cardiology? We need to schedule EGD/TCS, but she has to see cardiology first due to intermittent chest pain.   Manuela Schwartz, can you fax CT results to PCP? Doris [employer]

## 2018-11-12 NOTE — Telephone Encounter (Signed)
cc'ed to pcp °

## 2018-11-14 ENCOUNTER — Other Ambulatory Visit: Payer: Self-pay

## 2018-11-14 DIAGNOSIS — D72829 Elevated white blood cell count, unspecified: Secondary | ICD-10-CM

## 2018-11-14 NOTE — Telephone Encounter (Signed)
Joanna [name], would also like to recheck patients CBC as her WBC count was elevated on prior labs. Seems like this is somewhat chronic, but want to ensure it is not continuing to rise.

## 2018-11-14 NOTE — Telephone Encounter (Signed)
Lets increase Protonix to twice daily (30 minutes before breakfast and 30 minutes before dinner). She also needs to follow gastroparesis diet (low fiber/fat and several small meals a day rather than 3 large meals). Can we mail handout to her? Also needs to avoid foods that cause gas, such as broccoli, cabbage, cauliflower, and baked beans, carbonated drinks, hard candy, and chewing gum.  How is her constipation? Has she started MiraLAX? I would hold off on the fiber supplement for now in the setting of bloating/gas.   Has she scheduled an appointment with cardiology?

## 2018-11-14 NOTE — Telephone Encounter (Signed)
Noted. Pt returned called. Mailed gastroparesis diet and lab orders. Pt hasn't gotten the HFP labs needed from her last apt. Those orders were mailed as well.

## 2018-11-14 NOTE — Telephone Encounter (Signed)
Lmom, waiting on a return call.  

## 2018-11-15 ENCOUNTER — Encounter: Payer: Self-pay | Admitting: Cardiovascular Disease

## 2018-11-15 ENCOUNTER — Ambulatory Visit (INDEPENDENT_AMBULATORY_CARE_PROVIDER_SITE_OTHER): Payer: Medicaid Other | Admitting: Cardiovascular Disease

## 2018-11-15 ENCOUNTER — Other Ambulatory Visit: Payer: Self-pay

## 2018-11-15 VITALS — BP 120/75 | HR 64 | Temp 97.3°F | Ht 65.0 in | Wt 342.0 lb

## 2018-11-15 DIAGNOSIS — E785 Hyperlipidemia, unspecified: Secondary | ICD-10-CM | POA: Diagnosis not present

## 2018-11-15 DIAGNOSIS — I10 Essential (primary) hypertension: Secondary | ICD-10-CM | POA: Diagnosis not present

## 2018-11-15 DIAGNOSIS — Z720 Tobacco use: Secondary | ICD-10-CM

## 2018-11-15 DIAGNOSIS — R600 Localized edema: Secondary | ICD-10-CM | POA: Diagnosis not present

## 2018-11-15 DIAGNOSIS — I25118 Atherosclerotic heart disease of native coronary artery with other forms of angina pectoris: Secondary | ICD-10-CM

## 2018-11-15 MED ORDER — TORSEMIDE 5 MG PO TABS: 5.0000 mg | ORAL_TABLET | Freq: Every day | ORAL | 3 refills | Status: DC

## 2018-11-15 MED ORDER — ATORVASTATIN CALCIUM 40 MG PO TABS: 40.0000 mg | ORAL_TABLET | Freq: Every day | ORAL | 3 refills | Status: DC

## 2018-11-15 NOTE — Patient Instructions (Signed)
Medication Instructions:  Your physician recommends that you continue on your current medications as directed. Please refer to the Current Medication list given to you today.  If you need a refill on your cardiac medications before your next appointment, please call your pharmacy.   Lab work: None today If you have labs (blood work) drawn today and your tests are completely normal, you will receive your results only by: Marland Kitchen MyChart Message (if you have MyChart) OR . A paper copy in the mail If you have any lab test that is abnormal or we need to change your treatment, we will call you to review the results.  Testing/Procedures: Your physician has requested that you have an echocardiogram. Echocardiography is a painless test that uses sound waves to create images of your heart. It provides your doctor with information about the size and shape of your heart and how well your heart's chambers and valves are working. This procedure takes approximately one hour. There are no restrictions for this procedure.    Follow-Up: At [facility], you and your health needs are our priority.  As part of our continuing mission to provide you with exceptional heart care, we have created designated Provider Care Teams.  These Care Teams include your primary Cardiologist (physician) and Advanced Practice Providers (APPs -  Physician Assistants and Nurse Practitioners) who all work together to provide you with the care you need, when you need it. You will need a follow up appointment in 12 months.  Please call our office 2 months in advance to schedule this appointment.  You may see Kate Sable, MD or one of the following Advanced Practice Providers on your designated Care Team:   Bernerd Pho, PA-C [facility]) . Ermalinda Barrios, PA-C ([place])  Any Other Special Instructions Will Be Listed Below (If Applicable).   None   Thank you for choosing [facility] !

## 2018-11-15 NOTE — Addendum Note (Signed)
Addended by: Barbarann Ehlers A on: 11/15/2018 09:51 AM   Modules accepted: Orders

## 2018-11-15 NOTE — Progress Notes (Signed)
SUBJECTIVE: The patient presents for past due follow-up.  I last evaluated her on 07/13/2016. She has a history of obesity, coronary artery disease with percutaneous coronary intervention for an inferior ST elevation myocardial infarction, hypertension, diabetes mellitus, hyperlipidemia, interstitial lung disease, chronic lower extremity edema and obstructive sleep apnea.   On 07/27/14, a 90% proximal to mid RCA lesion was found for which she underwent successful percutaneous thrombectomy with a large degree of debris removal. LVEDP was elevated. A decision was made to continue dual antiplatelet therapy although no stent was placed. This was a relook cath after undergoing coronary angiography on 6/16.  Echocardiogram on 07/24/14 demonstrated normal left ventricular systolic function and regional wall motion, LVEF 55-60%.  She has occasional chest pains lasting about 1 to 2 minutes but she has not had to take nitroglycerin.  Chronic exertional dyspnea is stable.  She did not remember all of her medications but appears she is also taking atorvastatin and torsemide but does not know the dosages of either.  She is being scheduled for upper endoscopy and colonoscopy.  She continues to smoke.   Review of Systems: As per "subjective", otherwise negative.  Allergies  Allergen Reactions  . Wellbutrin [Bupropion] Rash    Current Outpatient Medications  Medication Sig Dispense Refill  . albuterol (PROVENTIL HFA;VENTOLIN HFA) 108 (90 Base) MCG/ACT inhaler Inhale into the lungs every 6 (six) hours as needed for wheezing or shortness of breath.    Marland Kitchen amLODipine (NORVASC) 2.5 MG tablet Take 1 tablet (2.5 mg total) by mouth daily. (Patient taking differently: Take 5 mg by mouth daily. ) 30 tablet 11  . aspirin EC 81 MG tablet Take 1 tablet (81 mg total) by mouth daily. 30 tablet 11  . baclofen (LIORESAL) 10 MG tablet Take 10 mg by mouth 3 (three) times daily.    . Blood Glucose Monitoring Suppl  (TRUE METRIX METER) W/DEVICE KIT 1 each by Does not apply route 3 (three) times daily. 1 kit 0  . gabapentin (NEURONTIN) 600 MG tablet Take 600 mg by mouth 3 (three) times daily.    Marland Kitchen glipiZIDE (GLUCOTROL) 10 MG tablet Take 10 mg by mouth daily before breakfast.    . glucose blood (TRUE METRIX BLOOD GLUCOSE TEST) test strip Use as instructed 100 each 12  . insulin detemir (LEVEMIR) 100 UNIT/ML injection Inject 10 Units into the skin daily.    Marland Kitchen levonorgestrel (MIRENA) 20 MCG/24HR IUD 1 each by Intrauterine route once.     . Liraglutide (VICTOZA [place]) Inject 0.06 Units into the skin.    Marland Kitchen lisinopril-hydrochlorothiazide (PRINZIDE,ZESTORETIC) 20-25 MG tablet Take 1 tablet by mouth daily.    . metFORMIN (GLUCOPHAGE) 1000 MG tablet Take 1 tablet (1,000 mg total) by mouth 2 (two) times daily with a meal. 60 tablet 5  . metoprolol (LOPRESSOR) 50 MG tablet Take 1 tablet (50 mg total) by mouth 2 (two) times daily. 60 tablet 6  . nitroGLYCERIN (NITROSTAT) 0.4 MG SL tablet Place 1 tablet (0.4 mg total) under the tongue every 5 (five) minutes as needed for chest pain (up to 3 doses). 25 tablet 3  . ondansetron (ZOFRAN) 4 MG tablet Take 1 tablet (4 mg total) by mouth every 8 (eight) hours as needed for nausea or vomiting. 30 tablet 0  . pantoprazole (PROTONIX) 40 MG tablet Take 1 tablet (40 mg total) by mouth daily. 30 tablet 3  . traZODone (DESYREL) 50 MG tablet Take 50 mg by mouth at bedtime.    Marland Kitchen  TRUEPLUS LANCETS 28G MISC 1 each by Does not apply route 3 (three) times daily. 100 each 5  . venlafaxine (EFFEXOR) 75 MG tablet Take 150 mg by mouth daily.      No current facility-administered medications for this visit.     Past Medical History:  Diagnosis Date  . Anxiety    Dx at age 69  . Arthritis    knee  . CAD (coronary artery disease)    a. Inferior STEMI 07/2014 - s/p thrombectomy of RCA, no stenting.  . Depression    at age 52; bipolar recent  . Diabetes mellitus without complication ([place])   .  Diabetes type 2, uncontrolled ([place]) Dx 2010  . DJD (degenerative joint disease)   . Gout   . Hyperlipidemia Dx 2010  . Hypertension    Dx at age 89  . IBS (irritable bowel syndrome)   . Interstitial lung disease ([place]) Dx 2011   No biopsy  . Lung disorder Dx 2011  . Morbid obesity ([place])   . Neuropathy   . Non compliance w medication regimen   . PCOS (polycystic ovarian syndrome) 1988   no children   . Sleep apnea   . STEMI (ST elevation myocardial infarction) ([place]) 07/23/14  . Tobacco abuse     Past Surgical History:  Procedure Laterality Date  . CARDIAC CATHETERIZATION N/A 07/23/2014   Procedure: Left Heart Cath and Coronary Angiography;  Surgeon: Peter M Martinique, MD;  Location: [place] [facility];  Service: Cardiovascular;  Laterality: N/A;  . CARDIAC CATHETERIZATION N/A 07/27/2014   Procedure: Left Heart Cath and Coronary Angiography;  Surgeon: Jettie Booze, MD;  Location: [place] [facility];  Service: Cardiovascular;  Laterality: N/A;  . CARDIAC CATHETERIZATION N/A 07/27/2014   Procedure: Coronary Balloon Angioplasty;  Surgeon: Jettie Booze, MD;  Location: [place] [facility];  Service: Cardiovascular;  Laterality: N/A;  thrombectomy only    Social History   Socioeconomic History  . Marital status: Single    Spouse name: Not on file  . Number of children: 0  . Years of education: Not on file  . Highest education level: Not on file  Occupational History  . Occupation: unemployed  Social Needs  . Financial resource strain: Not on file  . Food insecurity    Worry: Not on file    Inability: Not on file  . Transportation needs    Medical: Not on file    Non-medical: Not on file  Tobacco Use  . Smoking status: Current Every Day Smoker    Packs/day: 0.50    Years: 31.00    Pack years: 15.50    Types: Cigarettes    Start date: 08/04/1983  . Smokeless tobacco: Never Used  . Tobacco comment: about 10-15 cigs daily 09/28/14 - peak 2ppd  Substance and Sexual  Activity  . Alcohol use: No    Alcohol/week: 0.0 standard drinks  . Drug use: No  . Sexual activity: Never    Birth control/protection: I.U.D.  Lifestyle  . Physical activity    Days per week: Not on file    Minutes per session: Not on file  . Stress: Not on file  Relationships  . Social Herbalist on phone: Not on file    Gets together: Not on file    Attends religious service: Not on file    Active member of club or organization: Not on file    Attends meetings of clubs or organizations: Not  on file    Relationship status: Not on file  . Intimate partner violence    Fear of current or ex partner: Not on file    Emotionally abused: Not on file    Physically abused: Not on file    Forced sexual activity: Not on file  Other Topics Concern  . Not on file  Social History Narrative   Originally from [place], [place]. She has only lived in [place]. She moved to [facility] in Apr 27, 2014 after her brother died. Currently lives with her sister. She has traveled to [place], [place], [place], [place], [place], [place]. Previously has worked as a Corporate investment banker, in [employer], & also in Data processing manager. She has not been able to keep a job because of her panic attacks. No international travel. Currently has a dog & cat in her residence. No bird, mold, or hot tub exposure. No asbestos exposure.      Vitals:   11/15/18 0856  BP: 120/75  Pulse: 64  Temp: (!) 97.3 F (36.3 C)  SpO2: 95%  Weight: (!) 342 lb (155.1 kg)  Height: 5' 5"  (1.651 m)    Wt Readings from Last 3 Encounters:  11/15/18 (!) 342 lb (155.1 kg)  10/30/18 (!) 336 lb (152.4 kg)  07/25/16 (!) 313 lb (142 kg)     PHYSICAL EXAM General: NAD, morbidly obese. HEENT: Normal. Neck: No JVD, no thyromegaly. Lungs: Diffusely diminished breath sounds, no crackles or wheezes. CV: Regular rate and rhythm, normal S1/S2, no S3/S4, no murmur.  Chronic appearing bilateral leg edema.  No carotid bruit.   Abdomen: Soft, nontender.  Neurologic: Alert and  oriented.  Psych: Normal affect. Skin: Normal. Musculoskeletal: No gross deformities.      Labs: Lab Results  Component Value Date/Time   K 4.7 10/30/2018 10:16 AM   BUN 13 10/30/2018 10:16 AM   CREATININE 0.89 10/30/2018 10:16 AM   ALT 14 09/03/2014 11:20 AM   TSH 3.540 07/23/2014 04:45 PM   HGB 13.5 10/30/2018 10:16 AM     Lipids: Lab Results  Component Value Date/Time   LDLCALC 104 (H) 09/03/2014 11:20 AM   CHOL 165 09/03/2014 11:20 AM   TRIG 196 (H) 09/03/2014 11:20 AM   HDL 22 (L) 09/03/2014 11:20 AM       ASSESSMENT AND PLAN: 1.  Coronary artery disease: History of inferior STEMI s/p percutaneous thrombectomy without stent placement. Symptomatically stable with CCS class II angina. Continue aspirin, metoprolol, lisinopril, and atorvastatin.  I will obtain an echocardiogram to evaluate cardiac structure and function.  2. Essential HTN: Controlled on present therapy. No changes.  3. Hyperlipidemia: I will obtain a copy of most recent lipids from PCP.  She is on atorvastatin but does not know the dose.  4. Morbid obesity: Needs significant weight loss with exercise and dietary modification.  5.  Bilateral leg edema: This appears to be chronic.  She is apparently on torsemide but does not know the dose.  I will obtain an echocardiogram to evaluate cardiac structure and function.  6.  Tobacco use: She needs cessation.   Disposition: Follow up 1 yr   Kate Sable, M.D., F.A.C.C.

## 2018-11-25 ENCOUNTER — Other Ambulatory Visit: Payer: Self-pay

## 2018-11-25 ENCOUNTER — Ambulatory Visit (HOSPITAL_COMMUNITY)
Admission: RE | Admit: 2018-11-25 | Discharge: 2018-11-25 | Disposition: A | Discharge status: Home / Self Care | Payer: Medicaid Other | Source: Ambulatory Visit | Attending: Cardiovascular Disease | Admitting: Cardiovascular Disease

## 2018-11-25 DIAGNOSIS — R600 Localized edema: Secondary | ICD-10-CM

## 2018-11-25 NOTE — Progress Notes (Signed)
*  PRELIMINARY RESULTS* Echocardiogram 2D Echocardiogram has been performed.  Joanna [name] 11/25/2018, 2:55 PM

## 2018-11-26 LAB — CBC WITH DIFFERENTIAL/PLATELET
Absolute Monocytes: 534 cells/uL (ref 200–950)
Basophils Absolute: 46 cells/uL (ref 0–200)
Basophils Relative: 0.4 %
Eosinophils Absolute: 128 cells/uL (ref 15–500)
Eosinophils Relative: 1.1 %
HCT: 41.7 % (ref 35.0–45.0)
Hemoglobin: 13.6 g/dL (ref 11.7–15.5)
Lymphs Abs: 2946 cells/uL (ref 850–3900)
MCH: 27.5 pg (ref 27.0–33.0)
MCHC: 32.6 g/dL (ref 32.0–36.0)
MCV: 84.2 fL (ref 80.0–100.0)
MPV: 11.9 fL (ref 7.5–12.5)
Monocytes Relative: 4.6 %
Neutro Abs: 7946 cells/uL — ABNORMAL HIGH (ref 1500–7800)
Neutrophils Relative %: 68.5 %
Platelets: 244 10*3/uL (ref 140–400)
RBC: 4.95 10*6/uL (ref 3.80–5.10)
RDW: 14.6 % (ref 11.0–15.0)
Total Lymphocyte: 25.4 %
WBC: 11.6 10*3/uL — ABNORMAL HIGH (ref 3.8–10.8)

## 2018-11-26 LAB — HEPATIC FUNCTION PANEL
AG Ratio: 1.1 (calc) (ref 1.0–2.5)
ALT: 10 U/L (ref 6–29)
AST: 17 U/L (ref 10–35)
Albumin: 3.5 g/dL — ABNORMAL LOW (ref 3.6–5.1)
Alkaline phosphatase (APISO): 89 U/L (ref 37–153)
Bilirubin, Direct: 0.1 mg/dL (ref 0.0–0.2)
Globulin: 3.1 g/dL (calc) (ref 1.9–3.7)
Indirect Bilirubin: 0.3 mg/dL (calc) (ref 0.2–1.2)
Total Bilirubin: 0.4 mg/dL (ref 0.2–1.2)
Total Protein: 6.6 g/dL (ref 6.1–8.1)

## 2018-12-03 ENCOUNTER — Telehealth: Payer: Self-pay | Admitting: Obstetrics and Gynecology

## 2018-12-03 NOTE — Telephone Encounter (Signed)

## 2018-12-04 ENCOUNTER — Encounter: Payer: Self-pay | Admitting: *Deleted

## 2018-12-04 ENCOUNTER — Telehealth: Payer: Self-pay | Admitting: *Deleted

## 2018-12-04 ENCOUNTER — Other Ambulatory Visit: Payer: Medicaid Other | Admitting: Obstetrics and Gynecology

## 2018-12-04 ENCOUNTER — Other Ambulatory Visit: Payer: Self-pay | Admitting: *Deleted

## 2018-12-04 DIAGNOSIS — R1013 Epigastric pain: Secondary | ICD-10-CM

## 2018-12-04 DIAGNOSIS — R112 Nausea with vomiting, unspecified: Secondary | ICD-10-CM

## 2018-12-04 DIAGNOSIS — Z1211 Encounter for screening for malignant neoplasm of colon: Secondary | ICD-10-CM

## 2018-12-04 MED ORDER — CLENPIQ 10-3.5-12 MG-GM -GM/160ML PO SOLN: 1.0000 | Freq: Once | ORAL | 0 refills | Status: AC

## 2018-12-04 NOTE — Telephone Encounter (Signed)
Noted, instructions with pre-op/covid has been mailed to patient. Prep sent to pharmacy

## 2018-12-04 NOTE — Telephone Encounter (Signed)
We are holding slot 12/21 with RMR for TCS/EGD with propofol. Patient saw Dr. Raliegh Ip (cardiology in epic). Please advise if okay to proceed with scheduled? Thanks

## 2018-12-04 NOTE — Telephone Encounter (Signed)
Cardiology note reviewed. ECHO complete with normal EF. No further cardiac workup. Plans to continue with current medical management and follow-up in 1 years.   Ok to schedule procedures.  1 day prior to procedure Metformin as prescribed. One half dose of Victoza 1/2 tablet of glipizide 1/2 dose of Levemir. *She should be monitoring her blood sugars very closely while she is on clear liquids.  If her sugar gets low, she can correct it with clear liquids. Hold diabetes medications the morning of the procedure

## 2018-12-16 ENCOUNTER — Other Ambulatory Visit: Payer: Medicaid Other | Admitting: Obstetrics and Gynecology

## 2019-01-20 ENCOUNTER — Encounter (HOSPITAL_COMMUNITY): Payer: Self-pay | Admitting: Anesthesiology

## 2019-01-22 ENCOUNTER — Encounter (HOSPITAL_COMMUNITY): Payer: Self-pay

## 2019-01-22 NOTE — Patient Instructions (Signed)
Your procedure is scheduled on: 01/27/2019  Report to [facility] at   6:15  AM.  Call this number if you have problems the morning of surgery: [phone]   Remember:             No smoking am of procedure              Follow Directions on the letter you received from Your Physician's office regarding the Bowel Prep  :  Take these medicines the morning of surgery with A SIP OF WATER: Amlodipine, Metoprolol, protonix, and effexor   Do not wear jewelry, make-up or nail polish.    Do not bring valuables to the hospital.  Contacts, dentures or bridgework may not be worn into surgery.  .   Patients discharged the day of surgery will not be allowed to drive home.     Colonoscopy, Adult, Care After This sheet gives you information about how to care for yourself after your procedure. Your health care provider may also give you more specific instructions. If you have problems or questions, contact your health care provider. What can I expect after the procedure? After the procedure, it is common to have:  A small amount of blood in your stool for 24 hours after the procedure.  Some gas.  Mild abdominal cramping or bloating.  Follow these instructions at home: General instructions   For the first 24 hours after the procedure: ? Do not drive or use machinery. ? Do not sign important documents. ? Do not drink alcohol. ? Do your regular daily activities at a slower pace than normal. ? Eat soft, easy-to-digest foods. ? Rest often.  Take over-the-counter or prescription medicines only as told by your health care provider.  It is up to you to get the results of your procedure. Ask your health care provider, or the department performing the procedure, when your results will be ready. Relieving cramping and bloating  Try walking around when you have cramps or feel bloated.  Apply heat to your abdomen as told by your health care provider. Use a heat source that your health care  provider recommends, such as a moist heat pack or a heating pad. ? Place a towel between your skin and the heat source. ? Leave the heat on for 20-30 minutes. ? Remove the heat if your skin turns bright red. This is especially important if you are unable to feel pain, heat, or cold. You may have a greater risk of getting burned. Eating and drinking  Drink enough fluid to keep your urine clear or pale yellow.  Resume your normal diet as instructed by your health care provider. Avoid heavy or fried foods that are hard to digest.  Avoid drinking alcohol for as long as instructed by your health care provider. Contact a health care provider if:  You have blood in your stool 2-3 days after the procedure. Get help right away if:  You have more than a small spotting of blood in your stool.  You pass large blood clots in your stool.  Your abdomen is swollen.  You have nausea or vomiting.  You have a fever.  You have increasing abdominal pain that is not relieved with medicine. This information is not intended to replace advice given to you by your health care provider. Make sure you discuss any questions you have with your health care provider. Document Released: 09/07/2003 Document Revised: 10/18/2015 Document Reviewed: 04/06/2015 Elsevier Interactive Patient Education  2018 Elsevier  Inc.  Upper Endoscopy, Adult, Care After This sheet gives you information about how to care for yourself after your procedure. Your health care provider may also give you more specific instructions. If you have problems or questions, contact your health care provider. What can I expect after the procedure? After the procedure, it is common to have:  A sore throat.  Mild stomach pain or discomfort.  Bloating.  Nausea. Follow these instructions at home:   Follow instructions from your health care provider about what to eat or drink after your procedure.  Return to your normal activities as told by  your health care provider. Ask your health care provider what activities are safe for you.  Take over-the-counter and prescription medicines only as told by your health care provider.  Do not drive for 24 hours if you were given a sedative during your procedure.  Keep all follow-up visits as told by your health care provider. This is important. Contact a health care provider if you have:  A sore throat that lasts longer than one day.  Trouble swallowing. Get help right away if:  You vomit blood or your vomit looks like coffee grounds.  You have: ? A fever. ? Bloody, black, or tarry stools. ? A severe sore throat or you cannot swallow. ? Difficulty breathing. ? Severe pain in your chest or abdomen. Summary  After the procedure, it is common to have a sore throat, mild stomach discomfort, bloating, and nausea.  Do not drive for 24 hours if you were given a sedative during the procedure.  Follow instructions from your health care provider about what to eat or drink after your procedure.  Return to your normal activities as told by your health care provider. This information is not intended to replace advice given to you by your health care provider. Make sure you discuss any questions you have with your health care provider. Document Released: 07/25/2011 Document Revised: 07/17/2017 Document Reviewed: 06/25/2017 Elsevier Patient Education  2020 [employer].

## 2019-01-24 ENCOUNTER — Telehealth: Payer: Self-pay | Admitting: *Deleted

## 2019-01-24 ENCOUNTER — Encounter (HOSPITAL_COMMUNITY)
Admission: RE | Admit: 2019-01-24 | Discharge: 2019-01-24 | Disposition: A | Discharge status: Home / Self Care | Payer: Medicaid Other | Source: Ambulatory Visit | Attending: Internal Medicine | Admitting: Internal Medicine

## 2019-01-24 ENCOUNTER — Other Ambulatory Visit (HOSPITAL_COMMUNITY)
Admission: RE | Admit: 2019-01-24 | Discharge: 2019-01-24 | Disposition: A | Discharge status: Home / Self Care | Payer: Medicaid Other | Source: Ambulatory Visit | Attending: Internal Medicine | Admitting: Internal Medicine

## 2019-01-24 ENCOUNTER — Other Ambulatory Visit: Payer: Self-pay

## 2019-01-24 NOTE — Telephone Encounter (Signed)
-----   Message from Wilmer Floor, RN sent at 01/24/2019 11:29 AM EST ----- Regarding: no show Ms Rounsaville was a no show for PAT today she is for 01/27/2019  Thanks,  Jeannene Patella

## 2019-01-24 NOTE — Telephone Encounter (Signed)
LMOVM for pt 

## 2019-01-24 NOTE — Telephone Encounter (Signed)
Spoke with endo and she has been taken off the schedule

## 2019-01-27 ENCOUNTER — Encounter (HOSPITAL_COMMUNITY): Admission: RE | Payer: Self-pay | Source: Home / Self Care

## 2019-01-27 ENCOUNTER — Ambulatory Visit (HOSPITAL_COMMUNITY): Admission: RE | Admit: 2019-01-27 | Payer: Medicaid Other | Source: Home / Self Care | Admitting: Internal Medicine

## 2019-01-27 SURGERY — COLONOSCOPY WITH PROPOFOL
Anesthesia: Monitor Anesthesia Care

## 2019-01-27 NOTE — Telephone Encounter (Signed)
LMOVM

## 2019-02-04 NOTE — Progress Notes (Deleted)
Referring Provider: Jani Gravel, MD Primary Care Physician:  Jani Gravel, MD Primary GI Physician: Dr. Gala Romney  No chief complaint on file.   HPI:   Joanna [name] is a 48 y.o. female presenting today for follow-up.  She was seen in our office on 10/30/2018 at the request of  Denyce Robert, [place] for GERD.  She also had concerns of epigastric pain, nausea/vomiting, chronic constipation, and wanting to discuss colonoscopy.  GERD symptoms were well controlled on Protonix 40 mg daily.  Breakthrough with tomato-based products.  New onset of epigastric pain x1-2 months.  Often associated with chronic abdominal distention after meals but also present aside from eating.  Also with nausea a couple times a week with vomiting about once a week without identified triggers.  Admitted to early satiety. Tender bulging upper abdomen present x1 year with prior CT unrevealing.  Chronic constipation with BMs every 2-4 days.  Associated lower abdominal pain and pain in sides but improves after good BM.  Plans at that time included checking CBC, BMP and lipase, CT A/P with contrast, EGD if work-up unrevealing, Zofran for nausea/vomiting, continue Protonix, MiraLAX daily for constipation, daily fiber supplement, colonoscopy for colon cancer screening.  She is also advised to follow-up with cardiology for intermittent chest pain.  Labs on 10/30/2018: Lipase normal.  BMP normal except for elevated glucose at 128.  CBC remarkable for WBC 16.4 which seems chronic.  Patient denied signs or symptoms of acute infection.   CT on 11/08/2018 with no acute process to explain epigastric abdominal pain.  Mild prominence of the caudate and lateral segment left liver lobes.  Impression stated hepatic morphology for which mild cirrhosis cannot be excluded.  New left renal lesion which demonstrates complexity.  Renal cell carcinoma cannot be excluded.  Plans to check HFP.  Patient advised to follow-up with PCP on renal lesion for MRI in 6-12  months or possibly more acutely per radiology recommendations.  When following up on patient's symptoms on 11/11/2018, patient reported ongoing symptoms with mild improvement in nausea.  Also reported belching smelled like passed gas.  She is advised to increase Protonix to twice daily, follow gastroparesis diet, avoid foods that cause gas and bloating.  HFP on 11/25/2018 essentially normal.  CBC at that time with WBC down to 11.6, otherwise normal.   Patient saw cardiology, echo completed with normal EF.  No further cardiac work-up planned.  She was scheduled for TCS/EGD on 12/21 but patient no-show to preop appointment and could not be reached by phone.   Today:   Epigastric Pain:   Nausea/Vomiting:  GERD:  Constipation:   Liver abnormality on CT: Platelets and LFTs normal. ? Symptoms of advanced liver disease. With several risk factors for NAFLD,Consider [place] with elastography.   ?gastroparesis. Needs EGD.   Past Medical History:  Diagnosis Date  . Anxiety    Dx at age 53  . Arthritis    knee  . CAD (coronary artery disease)    a. Inferior STEMI 07/2014 - s/p thrombectomy of RCA, no stenting.  . Depression    at age 17; bipolar recent  . Diabetes mellitus without complication ([facility])   . Diabetes type 2, uncontrolled ([facility]) Dx 2010  . DJD (degenerative joint disease)   . Gout   . Hyperlipidemia Dx 2010  . Hypertension    Dx at age 54  . IBS (irritable bowel syndrome)   . Interstitial lung disease ([facility]) Dx 2011   No biopsy  . Lung  disorder Dx 2011  . Morbid obesity ([facility])   . Neuropathy   . Non compliance w medication regimen   . PCOS (polycystic ovarian syndrome) 1988   no children   . Sleep apnea   . STEMI (ST elevation myocardial infarction) ([facility]) 07/23/14  . Tobacco abuse     Past Surgical History:  Procedure Laterality Date  . CARDIAC CATHETERIZATION N/A 07/23/2014   Procedure: Left Heart Cath and Coronary Angiography;  Surgeon: Peter M Martinique, MD;  Location: [facility];  Service: Cardiovascular;  Laterality: N/A;  . CARDIAC CATHETERIZATION N/A 07/27/2014   Procedure: Left Heart Cath and Coronary Angiography;  Surgeon: Jettie Booze, MD;  Location: [facility];  Service: Cardiovascular;  Laterality: N/A;  . CARDIAC CATHETERIZATION N/A 07/27/2014   Procedure: Coronary Balloon Angioplasty;  Surgeon: Jettie Booze, MD;  Location: [facility];  Service: Cardiovascular;  Laterality: N/A;  thrombectomy only  . CORONARY ANGIOPLASTY      Current Outpatient Medications  Medication Sig Dispense Refill  . albuterol (PROVENTIL HFA;VENTOLIN HFA) 108 (90 Base) MCG/ACT inhaler Inhale into the lungs every 6 (six) hours as needed for wheezing or shortness of breath.    Marland Kitchen amLODipine (NORVASC) 2.5 MG tablet Take 1 tablet (2.5 mg total) by mouth daily. (Patient taking differently: Take 5 mg by mouth daily. ) 30 tablet 11  . aspirin EC 81 MG tablet Take 1 tablet (81 mg total) by mouth daily. 30 tablet 11  . atorvastatin (LIPITOR) 40 MG tablet Take 1 tablet (40 mg total) by mouth daily. 90 tablet 3  . baclofen (LIORESAL) 10 MG tablet Take 10 mg by mouth 3 (three) times daily.    . Blood Glucose Monitoring Suppl (TRUE METRIX METER) W/DEVICE KIT 1 each by Does not apply route 3 (three) times daily. 1 kit 0  . gabapentin (NEURONTIN) 600 MG tablet Take 600 mg by mouth 3 (three) times daily.    Marland Kitchen glipiZIDE (GLUCOTROL) 10 MG tablet Take 10 mg by mouth daily before breakfast.    . glucose blood (TRUE METRIX BLOOD GLUCOSE TEST) test strip Use as instructed 100 each 12  . insulin detemir (LEVEMIR) 100 UNIT/ML injection Inject 10 Units into the skin daily.    Marland Kitchen levonorgestrel (MIRENA) 20 MCG/24HR IUD 1 each by Intrauterine route once.     . Liraglutide (VICTOZA [place]) Inject 0.06 Units into the skin.    Marland Kitchen lisinopril-hydrochlorothiazide (PRINZIDE,ZESTORETIC) 20-25 MG tablet Take 1 tablet by mouth daily.    . metFORMIN (GLUCOPHAGE) 1000 MG tablet Take 1  tablet (1,000 mg total) by mouth 2 (two) times daily with a meal. 60 tablet 5  . metoprolol (LOPRESSOR) 50 MG tablet Take 1 tablet (50 mg total) by mouth 2 (two) times daily. 60 tablet 6  . nitroGLYCERIN (NITROSTAT) 0.4 MG SL tablet Place 1 tablet (0.4 mg total) under the tongue every 5 (five) minutes as needed for chest pain (up to 3 doses). 25 tablet 3  . ondansetron (ZOFRAN) 4 MG tablet Take 1 tablet (4 mg total) by mouth every 8 (eight) hours as needed for nausea or vomiting. 30 tablet 0  . pantoprazole (PROTONIX) 40 MG tablet Take 1 tablet (40 mg total) by mouth daily. 30 tablet 3  . torsemide (DEMADEX) 5 MG tablet Take 1 tablet (5 mg total) by mouth daily. 90 tablet 3  . traZODone (DESYREL) 50 MG tablet Take 50 mg by mouth at bedtime.    . TRUEPLUS LANCETS 28G MISC  1 each by Does not apply route 3 (three) times daily. 100 each 5  . venlafaxine (EFFEXOR) 75 MG tablet Take 150 mg by mouth daily.      No current facility-administered medications for this visit.    Allergies as of 02/05/2019 - Review Complete 11/15/2018  Allergen Reaction Noted  . Wellbutrin [bupropion] Rash 03/03/2014    Family History  Problem Relation Age of Onset  . Coronary artery disease Father 58       CABG  . Lung cancer Father   . Heart disease Father   . Diabetes Father   . Hypertension Father   . Hyperlipidemia Father   . Drug abuse Father   . Cancer Father        lung  . Lung cancer Mother   . Depression Mother   . Hypertension Mother   . Drug abuse Mother   . Cancer Mother        lung cancer  . Liver disease Brother        alcoholic cirrhosis >  HCC  . COPD Brother   . Asthma Brother   . Depression Brother   . Stroke Brother   . Drug abuse Brother   . Diabetes Sister   . Thyroid disease Sister   . Depression Sister   . Heart disease Sister   . Hypertension Sister   . Cirrhosis Maternal Aunt   . Cirrhosis Maternal Uncle   . Breast cancer Paternal Aunt   . Cancer Maternal Grandmother     . Emphysema Maternal Grandfather   . COPD Paternal Grandmother   . Rheumatologic disease Neg Hx   . Colon cancer Neg Hx   . Colon polyps Neg Hx     Social History   Socioeconomic History  . Marital status: Single    Spouse name: Not on file  . Number of children: 0  . Years of education: Not on file  . Highest education level: Not on file  Occupational History  . Occupation: unemployed  Tobacco Use  . Smoking status: Current Every Day Smoker    Packs/day: 0.50    Years: 31.00    Pack years: 15.50    Types: Cigarettes    Start date: 08/04/1983  . Smokeless tobacco: Never Used  . Tobacco comment: about 10-15 cigs daily 09/28/14 - peak 2ppd  Substance and Sexual Activity  . Alcohol use: No    Alcohol/week: 0.0 standard drinks  . Drug use: No  . Sexual activity: Never    Birth control/protection: I.U.D.  Other Topics Concern  . Not on file  Social History Narrative   Originally from [place], [place]. She has only lived in [place]. She moved to [facility] in 04-07-14 after her brother died. Currently lives with her sister. She has traveled to [place], [place], [place], [place], [place], [place]. Previously has worked as a Corporate investment banker, in [employer], & also in Data processing manager. She has not been able to keep a job because of her panic attacks. No international travel. Currently has a dog & cat in her residence. No bird, mold, or hot tub exposure. No asbestos exposure.    Social Determinants of Health   Financial Resource Strain:   . Difficulty of Paying Living Expenses: Not on file  Food Insecurity:   . Worried About Charity fundraiser in the Last Year: Not on file  . Ran Out of Food in the Last Year: Not on file  Transportation Needs:   .  Lack of Transportation (Medical): Not on file  . Lack of Transportation (Non-Medical): Not on file  Physical Activity:   . Days of Exercise per Week: Not on file  . Minutes of Exercise per Session: Not on file  Stress:   . Feeling of Stress : Not on file  Social  Connections:   . Frequency of Communication with Friends and Family: Not on file  . Frequency of Social Gatherings with Friends and Family: Not on file  . Attends Religious Services: Not on file  . Active Member of Clubs or Organizations: Not on file  . Attends Archivist Meetings: Not on file  . Marital Status: Not on file    Review of Systems: Gen: Denies fever, chills, anorexia. Denies fatigue, weakness, weight loss.  CV: Denies chest pain, palpitations, syncope, peripheral edema, and claudication. Resp: Denies dyspnea at rest, cough, wheezing, coughing up blood, and pleurisy. GI: Denies vomiting blood, jaundice, and fecal incontinence.   Denies dysphagia or odynophagia. Derm: Denies rash, itching, dry skin Psych: Denies depression, anxiety, memory loss, confusion. No homicidal or suicidal ideation.  Heme: Denies bruising, bleeding, and enlarged lymph nodes.  Physical Exam: There were no vitals taken for this visit. General:   Alert and oriented. No distress noted. Pleasant and cooperative.  Head:  Normocephalic and atraumatic. Eyes:  Conjuctiva clear without scleral icterus. Mouth:  Oral mucosa pink and moist. Good dentition. No lesions. Heart:  S1, S2 present without murmurs appreciated. Lungs:  Clear to auscultation bilaterally. No wheezes, rales, or rhonchi. No distress.  Abdomen:  +BS, soft, non-tender and non-distended. No rebound or guarding. No HSM or masses noted. Msk:  Symmetrical without gross deformities. Normal posture. Extremities:  Without edema. Neurologic:  Alert and  oriented x4 Psych:  Alert and cooperative. Normal mood and affect.

## 2019-02-05 ENCOUNTER — Ambulatory Visit: Payer: Medicaid Other | Admitting: Gastroenterology

## 2020-01-19 ENCOUNTER — Encounter: Payer: Self-pay | Admitting: Emergency Medicine

## 2020-01-19 ENCOUNTER — Emergency Department: Payer: Medicaid Other

## 2020-01-19 ENCOUNTER — Inpatient Hospital Stay
Admission: EM | Admit: 2020-01-19 | Discharge: 2020-01-22 | DRG: 065 | Disposition: A | Discharge status: Home / Self Care | Payer: Medicaid Other | Attending: Internal Medicine | Admitting: Internal Medicine

## 2020-01-19 ENCOUNTER — Other Ambulatory Visit: Payer: Self-pay

## 2020-01-19 DIAGNOSIS — Z818 Family history of other mental and behavioral disorders: Secondary | ICD-10-CM

## 2020-01-19 DIAGNOSIS — I2510 Atherosclerotic heart disease of native coronary artery without angina pectoris: Secondary | ICD-10-CM | POA: Diagnosis present

## 2020-01-19 DIAGNOSIS — I10 Essential (primary) hypertension: Secondary | ICD-10-CM | POA: Diagnosis present

## 2020-01-19 DIAGNOSIS — R29701 NIHSS score 1: Secondary | ICD-10-CM | POA: Diagnosis present

## 2020-01-19 DIAGNOSIS — Z833 Family history of diabetes mellitus: Secondary | ICD-10-CM

## 2020-01-19 DIAGNOSIS — Z79899 Other long term (current) drug therapy: Secondary | ICD-10-CM

## 2020-01-19 DIAGNOSIS — Z794 Long term (current) use of insulin: Secondary | ICD-10-CM

## 2020-01-19 DIAGNOSIS — F17210 Nicotine dependence, cigarettes, uncomplicated: Secondary | ICD-10-CM | POA: Diagnosis present

## 2020-01-19 DIAGNOSIS — Z9861 Coronary angioplasty status: Secondary | ICD-10-CM

## 2020-01-19 DIAGNOSIS — E1169 Type 2 diabetes mellitus with other specified complication: Secondary | ICD-10-CM

## 2020-01-19 DIAGNOSIS — Z7984 Long term (current) use of oral hypoglycemic drugs: Secondary | ICD-10-CM

## 2020-01-19 DIAGNOSIS — K219 Gastro-esophageal reflux disease without esophagitis: Secondary | ICD-10-CM | POA: Diagnosis present

## 2020-01-19 DIAGNOSIS — R414 Neurologic neglect syndrome: Secondary | ICD-10-CM | POA: Diagnosis present

## 2020-01-19 DIAGNOSIS — J849 Interstitial pulmonary disease, unspecified: Secondary | ICD-10-CM | POA: Diagnosis present

## 2020-01-19 DIAGNOSIS — M109 Gout, unspecified: Secondary | ICD-10-CM | POA: Diagnosis present

## 2020-01-19 DIAGNOSIS — F32A Depression, unspecified: Secondary | ICD-10-CM | POA: Diagnosis present

## 2020-01-19 DIAGNOSIS — R233 Spontaneous ecchymoses: Secondary | ICD-10-CM | POA: Diagnosis present

## 2020-01-19 DIAGNOSIS — Z20822 Contact with and (suspected) exposure to covid-19: Secondary | ICD-10-CM | POA: Diagnosis present

## 2020-01-19 DIAGNOSIS — K589 Irritable bowel syndrome without diarrhea: Secondary | ICD-10-CM | POA: Diagnosis present

## 2020-01-19 DIAGNOSIS — F419 Anxiety disorder, unspecified: Secondary | ICD-10-CM | POA: Diagnosis present

## 2020-01-19 DIAGNOSIS — Z9189 Other specified personal risk factors, not elsewhere classified: Secondary | ICD-10-CM

## 2020-01-19 DIAGNOSIS — R519 Headache, unspecified: Secondary | ICD-10-CM

## 2020-01-19 DIAGNOSIS — E785 Hyperlipidemia, unspecified: Secondary | ICD-10-CM | POA: Diagnosis present

## 2020-01-19 DIAGNOSIS — R600 Localized edema: Secondary | ICD-10-CM

## 2020-01-19 DIAGNOSIS — E1140 Type 2 diabetes mellitus with diabetic neuropathy, unspecified: Secondary | ICD-10-CM | POA: Diagnosis present

## 2020-01-19 DIAGNOSIS — G4733 Obstructive sleep apnea (adult) (pediatric): Secondary | ICD-10-CM | POA: Diagnosis present

## 2020-01-19 DIAGNOSIS — Z6841 Body Mass Index (BMI) 40.0 and over, adult: Secondary | ICD-10-CM

## 2020-01-19 DIAGNOSIS — I639 Cerebral infarction, unspecified: Principal | ICD-10-CM | POA: Diagnosis present

## 2020-01-19 DIAGNOSIS — H539 Unspecified visual disturbance: Secondary | ICD-10-CM

## 2020-01-19 DIAGNOSIS — Z8249 Family history of ischemic heart disease and other diseases of the circulatory system: Secondary | ICD-10-CM

## 2020-01-19 DIAGNOSIS — I252 Old myocardial infarction: Secondary | ICD-10-CM

## 2020-01-19 DIAGNOSIS — S06309A Unspecified focal traumatic brain injury with loss of consciousness of unspecified duration, initial encounter: Secondary | ICD-10-CM

## 2020-01-19 DIAGNOSIS — E1165 Type 2 diabetes mellitus with hyperglycemia: Secondary | ICD-10-CM | POA: Diagnosis present

## 2020-01-19 DIAGNOSIS — Z823 Family history of stroke: Secondary | ICD-10-CM

## 2020-01-19 DIAGNOSIS — Z7982 Long term (current) use of aspirin: Secondary | ICD-10-CM

## 2020-01-19 DIAGNOSIS — X58XXXA Exposure to other specified factors, initial encounter: Secondary | ICD-10-CM | POA: Diagnosis present

## 2020-01-19 LAB — CBC
HCT: 50.7 % — ABNORMAL HIGH (ref 36.0–46.0)
Hemoglobin: 16.9 g/dL — ABNORMAL HIGH (ref 12.0–15.0)
MCH: 28.2 pg (ref 26.0–34.0)
MCHC: 33.3 g/dL (ref 30.0–36.0)
MCV: 84.5 fL (ref 80.0–100.0)
Platelets: 299 10*3/uL (ref 150–400)
RBC: 6 MIL/uL — ABNORMAL HIGH (ref 3.87–5.11)
RDW: 12.9 % (ref 11.5–15.5)
WBC: 15 10*3/uL — ABNORMAL HIGH (ref 4.0–10.5)
nRBC: 0 % (ref 0.0–0.2)

## 2020-01-19 LAB — COMPREHENSIVE METABOLIC PANEL
ALT: 26 U/L (ref 0–44)
AST: 25 U/L (ref 15–41)
Albumin: 3.4 g/dL — ABNORMAL LOW (ref 3.5–5.0)
Alkaline Phosphatase: 109 U/L (ref 38–126)
Anion gap: 11 (ref 5–15)
BUN: 6 mg/dL (ref 6–20)
CO2: 28 mmol/L (ref 22–32)
Calcium: 9.3 mg/dL (ref 8.9–10.3)
Chloride: 96 mmol/L — ABNORMAL LOW (ref 98–111)
Creatinine, Ser: 0.71 mg/dL (ref 0.44–1.00)
GFR, Estimated: >60 mL/min (ref >60)
Glucose, Bld: 301 mg/dL — ABNORMAL HIGH (ref 70–99)
Potassium: 4.1 mmol/L (ref 3.5–5.1)
Sodium: 135 mmol/L (ref 135–145)
Total Bilirubin: 0.7 mg/dL (ref 0.3–1.2)
Total Protein: 7.9 g/dL (ref 6.5–8.1)

## 2020-01-19 LAB — DIFFERENTIAL
Abs Immature Granulocytes: 0.1 10*3/uL — ABNORMAL HIGH (ref 0.00–0.07)
Basophils Absolute: 0.1 10*3/uL (ref 0.0–0.1)
Basophils Relative: 1 %
Eosinophils Absolute: 0.1 10*3/uL (ref 0.0–0.5)
Eosinophils Relative: 1 %
Immature Granulocytes: 1 %
Lymphocytes Relative: 23 %
Lymphs Abs: 3.5 10*3/uL (ref 0.7–4.0)
Monocytes Absolute: 0.8 10*3/uL (ref 0.1–1.0)
Monocytes Relative: 5 %
Neutro Abs: 10.4 10*3/uL — ABNORMAL HIGH (ref 1.7–7.7)
Neutrophils Relative %: 69 %

## 2020-01-19 LAB — PROTIME-INR
INR: 1 (ref 0.8–1.2)
Prothrombin Time: 12.8 seconds (ref 11.4–15.2)

## 2020-01-19 LAB — POC URINE PREG, ED: Preg Test, Ur: NEGATIVE

## 2020-01-19 LAB — APTT: aPTT: 28 seconds (ref 24–36)

## 2020-01-19 MED ORDER — PROCHLORPERAZINE EDISYLATE 10 MG/2ML IJ SOLN
10.0000 mg | Freq: Once | INTRAMUSCULAR | Status: AC
  Administered 2020-01-19: 22:00:00 10 mg via INTRAVENOUS
  Filled 2020-01-19: qty 2

## 2020-01-19 MED ORDER — GADOBUTROL 1 MMOL/ML IV SOLN
10.0000 mL | Freq: Once | INTRAVENOUS | Status: AC | PRN
  Administered 2020-01-20: 01:00:00 10 mL via INTRAVENOUS

## 2020-01-19 MED ORDER — SODIUM CHLORIDE 0.9 % IV BOLUS
500.0000 mL | Freq: Once | INTRAVENOUS | Status: AC
  Administered 2020-01-19: 22:00:00 500 mL via INTRAVENOUS

## 2020-01-19 NOTE — ED Notes (Signed)
Pt placed on 2L of oxygen via [place] after oxygen saturations dropped to 88% multiple times while sleeping. Audible snoring heard but when RN woke pt pt denies using CPAP or oxygen at home.

## 2020-01-19 NOTE — ED Provider Notes (Signed)
[facility] Emergency Department Provider Note   ____________________________________________   I have reviewed the triage vital signs and the nursing notes.   HISTORY  Chief Complaint Headache and Altered Mental Status   History limited by: Not Limited   HPI Joanna [name] is a 49 y.o. female who presents to the emergency department today because of concerns for headache, vision and balance changes.  Patient states symptoms started 3 days ago.  She denies any unusual activities prior to the symptoms starting.  She denies any head trauma.  She states she first noticed a headache located in the front the central part.  She then noticed some change in her vision.  She stated she had an episode where she did not recognize her hand is her own hand.  She delayed coming to the emergency department because she felt like things would start getting better.  She does think that her vision has somewhat gotten better although not significantly.  She is also noticed some issues with ambulation.  She denies similar symptoms in the past. Does have history of DM, HTN however has not been on medication for a number of months.   Records reviewed. Per medical record review patient has a history of HLD, HTN, DM.   Past Medical History:  Diagnosis Date  . Anxiety    Dx at age 21  . Arthritis    knee  . CAD (coronary artery disease)    a. Inferior STEMI 07/2014 - s/p thrombectomy of RCA, no stenting.  . Depression    at age 79; bipolar recent  . Diabetes mellitus without complication ([place])   . Diabetes type 2, uncontrolled ([place]) Dx 2010  . DJD (degenerative joint disease)   . Gout   . Hyperlipidemia Dx 2010  . Hypertension    Dx at age 57  . IBS (irritable bowel syndrome)   . Interstitial lung disease ([place]) Dx 2011   No biopsy  . Lung disorder Dx 2011  . Morbid obesity ([place])   . Neuropathy   . Non compliance w medication regimen   . PCOS (polycystic ovarian syndrome) 1988    no children   . Sleep apnea   . STEMI (ST elevation myocardial infarction) ([place]) 07/23/14  . Tobacco abuse     Patient Active Problem List   Diagnosis Date Noted  . Non-intractable vomiting 10/30/2018  . Central abdominal mass 10/30/2018  . Constipation 10/30/2018  . Colon cancer screening 10/30/2018  . Abdominal pain, epigastric 10/30/2018  . Chronic pain of right knee 07/11/2016  . Cigarette nicotine dependence without complication 54/65/6812  . Sebaceous cyst of breast, right 03/07/2016  . OSA (obstructive sleep apnea) 10/26/2014  . GERD (gastroesophageal reflux disease) 10/26/2014  . Agoraphobia 09/28/2014  . Generalized anxiety disorder 09/28/2014  . Screening for HIV (human immunodeficiency virus) 09/15/2014  . Menometrorrhagia 09/15/2014  . PCOS (polycystic ovarian syndrome)   . CAD (coronary artery disease) 07/28/2014  . Leukocytosis 07/28/2014  . Family history of coronary artery disease in father 07/23/2014  . ST elevation myocardial infarction (STEMI) involving right coronary artery with complication ([place]) 75/17/0017  . Interstitial lung disease ([place])   . Uncontrolled diabetes mellitus ([place])   . Hypertension   . Morbid obesity ([place])   . Non compliance w medication regimen   . Depression   . Smoker     Past Surgical History:  Procedure Laterality Date  . CARDIAC CATHETERIZATION N/A 07/23/2014   Procedure: Left Heart Cath and Coronary Angiography;  Surgeon: Peter M Martinique, MD;  Location: [facility];  Service: Cardiovascular;  Laterality: N/A;  . CARDIAC CATHETERIZATION N/A 07/27/2014   Procedure: Left Heart Cath and Coronary Angiography;  Surgeon: Jettie Booze, MD;  Location: [facility];  Service: Cardiovascular;  Laterality: N/A;  . CARDIAC CATHETERIZATION N/A 07/27/2014   Procedure: Coronary Balloon Angioplasty;  Surgeon: Jettie Booze, MD;  Location: [facility];  Service: Cardiovascular;  Laterality: N/A;  thrombectomy only   . CORONARY ANGIOPLASTY      Prior to Admission medications   Medication Sig Start Date End Date Taking? Authorizing Provider  albuterol (PROVENTIL HFA;VENTOLIN HFA) 108 (90 Base) MCG/ACT inhaler Inhale into the lungs every 6 (six) hours as needed for wheezing or shortness of breath.    [provider]  amLODipine (NORVASC) 2.5 MG tablet Take 1 tablet (2.5 mg total) by mouth daily. Patient taking differently: Take 5 mg by mouth daily.  08/04/14   Lendon Colonel, NP  aspirin EC 81 MG tablet Take 1 tablet (81 mg total) by mouth daily. 07/28/14   Dunn, Nedra Hai, PA-C  atorvastatin (LIPITOR) 40 MG tablet Take 1 tablet (40 mg total) by mouth daily. 11/15/18 02/13/19  Herminio Commons, MD  baclofen (LIORESAL) 10 MG tablet Take 10 mg by mouth 3 (three) times daily.    [provider]  Blood Glucose Monitoring Suppl (TRUE METRIX METER) W/DEVICE KIT 1 each by Does not apply route 3 (three) times daily. 08/03/14   Charlott Rakes, MD  gabapentin (NEURONTIN) 600 MG tablet Take 600 mg by mouth 3 (three) times daily.    [provider]  glipiZIDE (GLUCOTROL) 10 MG tablet Take 10 mg by mouth daily before breakfast.    [provider]  glucose blood (TRUE METRIX BLOOD GLUCOSE TEST) test strip Use as instructed 08/03/14   Charlott Rakes, MD  insulin detemir (LEVEMIR) 100 UNIT/ML injection Inject 10 Units into the skin daily.    [provider]  levonorgestrel (MIRENA) 20 MCG/24HR IUD 1 each by Intrauterine route once.  08/28/12   [provider]  Liraglutide (VICTOZA ) Inject 0.06 Units into the skin.    [provider]  lisinopril-hydrochlorothiazide (PRINZIDE,ZESTORETIC) 20-25 MG tablet Take 1 tablet by mouth daily.    [provider]  metFORMIN (GLUCOPHAGE) 1000 MG tablet Take 1 tablet (1,000 mg total) by mouth 2 (two) times daily with a meal. 09/15/14   Funches, Josalyn, MD  metoprolol (LOPRESSOR) 50 MG tablet Take 1 tablet (50 mg  total) by mouth 2 (two) times daily. 07/28/14   Dunn, Nedra Hai, PA-C  nitroGLYCERIN (NITROSTAT) 0.4 MG SL tablet Place 1 tablet (0.4 mg total) under the tongue every 5 (five) minutes as needed for chest pain (up to 3 doses). 07/28/14   Dunn, Nedra Hai, PA-C  ondansetron (ZOFRAN) 4 MG tablet Take 1 tablet (4 mg total) by mouth every 8 (eight) hours as needed for nausea or vomiting. 10/30/18   Erenest Rasher, PA-C  pantoprazole (PROTONIX) 40 MG tablet Take 1 tablet (40 mg total) by mouth daily. 09/28/14   Javier Glazier, MD  torsemide (DEMADEX) 5 MG tablet Take 1 tablet (5 mg total) by mouth daily. 11/15/18   Herminio Commons, MD  traZODone (DESYREL) 50 MG tablet Take 50 mg by mouth at bedtime.    [provider]  TRUEPLUS LANCETS 28G MISC 1 each by Does not apply route 3 (three) times daily. 09/03/14   Newlin,  Enobong, MD  venlafaxine (EFFEXOR) 75 MG tablet Take 150 mg by mouth daily.     [provider]    Allergies Wellbutrin [bupropion]  Family History  Problem Relation Age of Onset  . Coronary artery disease Father 65       CABG  . Lung cancer Father   . Heart disease Father   . Diabetes Father   . Hypertension Father   . Hyperlipidemia Father   . Drug abuse Father   . Cancer Father        lung  . Lung cancer Mother   . Depression Mother   . Hypertension Mother   . Drug abuse Mother   . Cancer Mother        lung cancer  . Liver disease Brother        alcoholic cirrhosis >  HCC  . COPD Brother   . Asthma Brother   . Depression Brother   . Stroke Brother   . Drug abuse Brother   . Diabetes Sister   . Thyroid disease Sister   . Depression Sister   . Heart disease Sister   . Hypertension Sister   . Cirrhosis Maternal Aunt   . Cirrhosis Maternal Uncle   . Breast cancer Paternal Aunt   . Cancer Maternal Grandmother   . Emphysema Maternal Grandfather   . COPD Paternal Grandmother   . Rheumatologic disease Neg Hx   . Colon cancer Neg Hx   . Colon  polyps Neg Hx     Social History Social History   Tobacco Use  . Smoking status: Current Every Day Smoker    Packs/day: 1.00    Years: 31.00    Pack years: 31.00    Types: Cigarettes    Start date: 08/04/1983  . Smokeless tobacco: Never Used  . Tobacco comment: about 10-15 cigs daily 09/28/14 - peak 2ppd  Vaping Use  . Vaping Use: Never used  Substance Use Topics  . Alcohol use: No    Alcohol/week: 0.0 standard drinks  . Drug use: No    Review of Systems Constitutional: No fever/chills Eyes: Positive for visual changes. ENT: No sore throat. Cardiovascular: Denies chest pain. Respiratory: Denies shortness of breath. Gastrointestinal: No abdominal pain.  No nausea, no vomiting.  No diarrhea.   Genitourinary: Negative for dysuria. Musculoskeletal: Negative for back pain. Skin: Negative for rash. Neurological: Positive for headache.  ____________________________________________   PHYSICAL EXAM:  VITAL SIGNS: ED Triage Vitals  Enc Vitals Group     BP 01/19/20 1439 (!) 167/99     Pulse Rate 01/19/20 1439 86     Resp 01/19/20 1439 18     Temp 01/19/20 1439 98.7 F (37.1 C)     Temp Source 01/19/20 1439 Oral     SpO2 01/19/20 1439 93 %     Weight 01/19/20 1440 (!) 341 lb 14.9 oz (155.1 kg)     Height 01/19/20 1440 5' 5"  (1.651 m)     Head Circumference --      Peak Flow --      Pain Score 01/19/20 1439 5   Constitutional: Alert and oriented.  Eyes: Conjunctivae are normal.  ENT      Head: Normocephalic and atraumatic.      Nose: No congestion/rhinnorhea.      Mouth/Throat: Mucous membranes are moist.      Neck: No stridor. Hematological/Lymphatic/Immunilogical: No cervical lymphadenopathy. Cardiovascular: Normal rate, regular rhythm.  No murmurs, rubs, or gallops.  Respiratory: Normal respiratory effort  without tachypnea nor retractions. Breath sounds are clear and equal bilaterally. No wheezes/rales/rhonchi. Gastrointestinal: Soft and non tender. No rebound.  No guarding.  Genitourinary: Deferred Musculoskeletal: Normal range of motion in all extremities. No lower extremity edema. Neurologic:  Normal speech and language. Strength 5/5 in upper and lower extremities. Sensation intact. No gross focal neurologic deficits are appreciated.  Skin:  Skin is warm, dry and intact. No rash noted. Psychiatric: Mood and affect are normal. Speech and behavior are normal. Patient exhibits appropriate insight and judgment. ____________________________________________    LABS (pertinent positives/negatives)  CMP na 135, k 4.1, glu 301, cr 0.71 Upreg negative CBC wbc 15.0, hgb 16.9, plt 299 ____________________________________________   EKG  I, Nance Pear, attending physician, personally viewed and interpreted this EKG  EKG Time: 1443 Rate: 88 Rhythm: normal sinus rhythm Axis: rightward axis Intervals: qtc 450 QRS: narrow, low voltage ST changes: no st elevation Impression: abnormal ekg  ____________________________________________    RADIOLOGY  CT Head No acute abnormality. Partially empty sella turcica.   ____________________________________________   PROCEDURES  Procedures  ____________________________________________   INITIAL IMPRESSION / ASSESSMENT AND PLAN / ED COURSE  Pertinent labs & imaging results that were available during my care of the patient were reviewed by me and considered in my medical decision making (see chart for details).   Patient presented to the emergency department today because of concerns for headache as well as some visual change and balance issue.  Symptoms have been present for the past 3 days.  Patient would be outside of any window for TPA if patient was found to have stroke although unclear etiology at this time.  CT scan showed partially empty sella turcica which could suggest increased intracranial hypertension.  While this could potentially explain the patient's symptoms I do also have concerns  for stroke.  Given patient's age does also have concerns for possible obstructing lesion causing her symptoms.  Because of this will order MRI.  Will also give patient medication in case it is complex migraine. If MRI negative and if patient feeling better could potentially be discharged to follow up with neurology.  ____________________________________________   FINAL CLINICAL IMPRESSION(S) / ED DIAGNOSES  Final diagnoses:  Bad headache  Vision changes     Note: This dictation was prepared with Dragon dictation. Any transcriptional errors that result from this process are unintentional     Nance Pear, MD 01/19/20 2252

## 2020-01-19 NOTE — ED Provider Notes (Addendum)
----------------------------------------- 11:52 PM on 01/19/2020 -----------------------------------------  Assuming care from Dr. Archie Balboa.  In short, Joanna [name] is a 49 y.o. female with a chief complaint of headache.  Refer to the original H&P for additional details.  The current plan of care is to follow up MRI results and reassess.   ----------------------------------------- 2:00 AM on 01/20/2020 -----------------------------------------   MR BRAIN WO CONTRAST  Result Date: 01/20/2020 CLINICAL DATA:  headache and vision change EXAM: MRI HEAD WITHOUT CONTRAST MRV HEAD WITHOUT AND WITH CONTRAST TECHNIQUE: Multiplanar, multiecho pulse sequences of the brain and surrounding structures were obtained without intravenous contrast. Angiographic images of the intracranial venous structures were obtained using MRV technique without and with intravenous contrast. COMPARISON:  None. FINDINGS: MRI HEAD WITHOUT CONTRAST Brain: There is multifocal abnormal diffusion restriction in the right hemisphere in a watershed distribution. There is associated magnetic susceptibility effect in the right frontal, occipital and posterior parietal lobes . No midline shift or other mass effect. There is mild contrast enhancement associated with the sites. Normal white matter signal, parenchymal volume and CSF spaces. The midline structures are normal. Vascular: Major flow voids are preserved. Skull and upper cervical spine: Normal calvarium and skull base. Visualized upper cervical spine and soft tissues are normal. Sinuses/Orbits:No paranasal sinus fluid levels or advanced mucosal thickening. No mastoid or middle ear effusion. Normal orbits. MR VENOGRAM WITHOUT CONTRAST Superior sagittal sinus: Normal. Straight sinus: Normal. Inferior sagittal sinus, vein of Galen and internal cerebral veins: Normal. Transverse sinuses: Normal. Sigmoid sinuses: Normal. Visualized jugular veins: Normal. IMPRESSION: 1. Multifocal acute  ischemia in the right hemisphere in a watershed distribution. Associated petechial hemorrhage Heidelberg classification 1b: HI2, confluent petechiae, no mass effect. 2. Contrast enhancement at the sites of abnormal diffusion restriction are likely due to blood brain barrier breakdown in the setting of ischemia. 3. Normal MRV of the head. Electronically Signed   By: Ulyses Jarred M.D.   On: 01/20/2020 00:58   MR MRV HEAD W WO CONTRAST  Result Date: 01/20/2020 CLINICAL DATA:  headache and vision change EXAM: MRI HEAD WITHOUT CONTRAST MRV HEAD WITHOUT AND WITH CONTRAST TECHNIQUE: Multiplanar, multiecho pulse sequences of the brain and surrounding structures were obtained without intravenous contrast. Angiographic images of the intracranial venous structures were obtained using MRV technique without and with intravenous contrast. COMPARISON:  None. FINDINGS: MRI HEAD WITHOUT CONTRAST Brain: There is multifocal abnormal diffusion restriction in the right hemisphere in a watershed distribution. There is associated magnetic susceptibility effect in the right frontal, occipital and posterior parietal lobes . No midline shift or other mass effect. There is mild contrast enhancement associated with the sites. Normal white matter signal, parenchymal volume and CSF spaces. The midline structures are normal. Vascular: Major flow voids are preserved. Skull and upper cervical spine: Normal calvarium and skull base. Visualized upper cervical spine and soft tissues are normal. Sinuses/Orbits:No paranasal sinus fluid levels or advanced mucosal thickening. No mastoid or middle ear effusion. Normal orbits. MR VENOGRAM WITHOUT CONTRAST Superior sagittal sinus: Normal. Straight sinus: Normal. Inferior sagittal sinus, vein of Galen and internal cerebral veins: Normal. Transverse sinuses: Normal. Sigmoid sinuses: Normal. Visualized jugular veins: Normal. IMPRESSION: 1. Multifocal acute ischemia in the right hemisphere in a watershed  distribution. Associated petechial hemorrhage Heidelberg classification 1b: HI2, confluent petechiae, no mass effect. 2. Contrast enhancement at the sites of abnormal diffusion restriction are likely due to blood brain barrier breakdown in the setting of ischemia. 3. Normal MRV of the head. Electronically Signed   By: Lennette Bihari  [place] M.D.   On: 01/20/2020 00:58    Acute multifocal infarctions in the right hemisphere in a watershed distribution with associated petechial hemorrhage.  Given the acute nature of the symptoms as well as the evidence of hemorrhage, I spoke by phone about the case with Dr. Lacinda Axon with neurosurgery.  He confirmed that there is no role for neurosurgery at this time and that I can admit the patient to the hospital for neurology consultation.  He agreed with avoiding the usual full dose aspirin that is typically given after a diagnosis of CVA.  I reassessed the patient and I do not find any evidence of any focal neurological deficits at this time.  I updated her regarding the diagnosis.  I then consulted the hospitalist and discussed the case by phone with Dr. Damita Dunnings.  She understands and agrees with the plan for admission.  There is no role for emergent teleneurology evaluation at this time.   .Critical Care Performed by: Hinda Kehr, MD Authorized by: Hinda Kehr, MD   Critical care provider statement:    Critical care time (minutes):  30   Critical care time was exclusive of:  Separately billable procedures and treating other patients   Critical care was necessary to treat or prevent imminent or life-threatening deterioration of the following conditions:  CNS failure or compromise   Critical care was time spent personally by me on the following activities:  Development of treatment plan with patient or surrogate, discussions with consultants, evaluation of patient's response to treatment, examination of patient, obtaining history from patient or surrogate, ordering and  performing treatments and interventions, ordering and review of laboratory studies, ordering and review of radiographic studies, pulse oximetry, re-evaluation of patient's condition and review of old charts       Final diagnoses:  Bad headache  Vision changes  Cerebrovascular accident (CVA), unspecified mechanism ([place])  Closed head injury with petechial brain hemorrhage with loss of consciousness, initial encounter [facility])      Hinda Kehr, MD 01/20/20 Sylvester Harder    Hinda Kehr, MD 01/22/20 [phone]

## 2020-01-19 NOTE — ED Triage Notes (Signed)
Pt via ems from home with headache, double vision, confusion x 3 days. Pt states she "thought it would go away." Pt states she sometimes looks at her phone and doesn't know what to do with it, or doesn't recognize that her hand is her hand. Pt reports "flashing and flickering" out of the corner of her left eye. Pt alert & oriented x 4, nad noted.

## 2020-01-20 ENCOUNTER — Observation Stay: Payer: Medicaid Other

## 2020-01-20 ENCOUNTER — Encounter: Payer: Self-pay | Admitting: Internal Medicine

## 2020-01-20 DIAGNOSIS — I639 Cerebral infarction, unspecified: Secondary | ICD-10-CM | POA: Diagnosis not present

## 2020-01-20 DIAGNOSIS — E785 Hyperlipidemia, unspecified: Secondary | ICD-10-CM

## 2020-01-20 LAB — LIPID PANEL
Cholesterol: 190 mg/dL (ref 0–200)
HDL: 23 mg/dL — ABNORMAL LOW (ref >40)
LDL Cholesterol: 93 mg/dL (ref 0–99)
Total CHOL/HDL Ratio: 8.3 RATIO
Triglycerides: 372 mg/dL — ABNORMAL HIGH (ref <150)
VLDL: 74 mg/dL — ABNORMAL HIGH (ref 0–40)

## 2020-01-20 LAB — HEMOGLOBIN A1C
Hgb A1c MFr Bld: 13.7 % — ABNORMAL HIGH (ref 4.8–5.6)
Mean Plasma Glucose: 346.49 mg/dL

## 2020-01-20 LAB — CBG MONITORING, ED
Glucose-Capillary: 259 mg/dL — ABNORMAL HIGH (ref 70–99)
Glucose-Capillary: 287 mg/dL — ABNORMAL HIGH (ref 70–99)
Glucose-Capillary: 268 mg/dL — ABNORMAL HIGH (ref 70–99)
Glucose-Capillary: 270 mg/dL — ABNORMAL HIGH (ref 70–99)

## 2020-01-20 LAB — ANTITHROMBIN III: AntiThromb III Func: 96 % (ref 75–120)

## 2020-01-20 LAB — HEPARIN LEVEL (UNFRACTIONATED): Heparin Unfractionated: <0.1 IU/mL — ABNORMAL LOW (ref 0.30–0.70)

## 2020-01-20 LAB — HIV ANTIBODY (ROUTINE TESTING W REFLEX): HIV Screen 4th Generation wRfx: NONREACTIVE

## 2020-01-20 LAB — RESP PANEL BY RT-PCR (FLU A&B, COVID) ARPGX2
Influenza A by PCR: NEGATIVE
Influenza B by PCR: NEGATIVE
SARS Coronavirus 2 by RT PCR: NEGATIVE

## 2020-01-20 MED ORDER — ACETAMINOPHEN 325 MG PO TABS: 650.0000 mg | ORAL_TABLET | ORAL | Status: DC | PRN

## 2020-01-20 MED ORDER — INSULIN ASPART 100 UNIT/ML ~~LOC~~ SOLN
0.0000 [IU] | Freq: Three times a day (TID) | SUBCUTANEOUS | Status: DC
  Administered 2020-01-20 (×2): 8 [IU] via SUBCUTANEOUS
  Administered 2020-01-20 – 2020-01-21 (×2): 5 [IU] via SUBCUTANEOUS
  Administered 2020-01-21: 18:00:00 3 [IU] via SUBCUTANEOUS
  Administered 2020-01-21: 10:00:00 8 [IU] via SUBCUTANEOUS
  Administered 2020-01-22: 09:00:00 5 [IU] via SUBCUTANEOUS
  Filled 2020-01-20 (×7): qty 1

## 2020-01-20 MED ORDER — INSULIN ASPART 100 UNIT/ML ~~LOC~~ SOLN
0.0000 [IU] | Freq: Every day | SUBCUTANEOUS | Status: DC
  Administered 2020-01-20 – 2020-01-21 (×2): 3 [IU] via SUBCUTANEOUS
  Filled 2020-01-20 (×2): qty 1

## 2020-01-20 MED ORDER — ACETAMINOPHEN 160 MG/5ML PO SOLN
650.0000 mg | ORAL | Status: DC | PRN
  Filled 2020-01-20: qty 20.3

## 2020-01-20 MED ORDER — STROKE: EARLY STAGES OF RECOVERY BOOK: Freq: Once | Status: DC

## 2020-01-20 MED ORDER — ASPIRIN EC 81 MG PO TBEC
81.0000 mg | DELAYED_RELEASE_TABLET | Freq: Every day | ORAL | Status: DC
  Administered 2020-01-20: 11:00:00 81 mg via ORAL
  Filled 2020-01-20: qty 1

## 2020-01-20 MED ORDER — HEPARIN (PORCINE) 25000 UT/250ML-% IV SOLN
1450.0000 [IU]/h | INTRAVENOUS | Status: DC
  Administered 2020-01-20: 15:00:00 1150 [IU]/h via INTRAVENOUS
  Filled 2020-01-20: qty 250

## 2020-01-20 MED ORDER — IOHEXOL 350 MG/ML SOLN
75.0000 mL | Freq: Once | INTRAVENOUS | Status: AC | PRN
  Administered 2020-01-20: 75 mL via INTRAVENOUS
  Filled 2020-01-20: qty 75

## 2020-01-20 MED ORDER — ACETAMINOPHEN 650 MG RE SUPP: 650.0000 mg | RECTAL | Status: DC | PRN

## 2020-01-20 MED ORDER — ATORVASTATIN CALCIUM 80 MG PO TABS
80.0000 mg | ORAL_TABLET | Freq: Every day | ORAL | Status: DC
  Administered 2020-01-20 – 2020-01-22 (×3): 80 mg via ORAL
  Filled 2020-01-20: qty 1
  Filled 2020-01-20: qty 4
  Filled 2020-01-20: qty 1

## 2020-01-20 NOTE — Evaluation (Signed)
Physical Therapy Evaluation Patient Details Name: Joanna [name] MRN: [id number] DOB: 1970/11/14 Today's Date: 01/20/2020   History of Present Illness  49 y.o. female past medical history of diabetes, obesity, PCOS, sleep apnea, hypertension, hyperlipidemia, depression and anxiety, presented to the emergency room for evaluation of headache and changes with her vision and balance.  Her last known well was sometime on Friday, 01/16/2020 when she noted sudden onset of a headache which was on the right side of her head which was very persistent and did not get relieved.  Clinical Impression  Pt showed good effort with PT session and actually did quite well.  However, she does not typically need an AD and clearly was much safer with some UE support today, used walker most of the ambulation bout, and displayed strikingly decreased stability and confidence attempting to walk with single hand on wall or no AD.  Pt reports she does not typically walk any more than what we did today but she, again, is not at her baseline and will require HHPT and some increased assistance from family in order to return safely.     Follow Up Recommendations Home health PT;Supervision/Assistance - 24 hour    Equipment Recommendations  Rolling walker with 5" wheels;3in1 (PT) (may need bariatric per weight limit on RW)    Recommendations for Other Services       Precautions / Restrictions Precautions Precautions: Fall Restrictions Weight Bearing Restrictions: No      Mobility  Bed Mobility Overal bed mobility: Modified Independent             General bed mobility comments: Pt was able to get to sitting EOB w/o assist w/o bed rail    Transfers Overall transfer level: Modified independent Equipment used: Rolling walker (2 wheeled)             General transfer comment: Pt is able to rise to standing w/o direct phyiscal assist, reliant on walker with poor confidence  Ambulation/Gait Ambulation/Gait  assistance: Min guard Gait Distance (Feet): 110 Feet Assistive device: Rolling walker (2 wheeled)       General Gait Details: Pt with slow, guarded ambulation with occasional L foot drag and decreased control with foot placement and weight acceptance.  She had no LOBs or overt safety issues, but is far from her baseline.  We did trial ambulation w/o AD (<10 ft), she needed wall to hold to and had significantly decreased confidence, speed and stability  Stairs            Wheelchair Mobility    Modified Rankin (Stroke Patients Only)       Balance Overall balance assessment: Needs assistance Sitting-balance support: No upper extremity supported Sitting balance-Leahy Scale: Good     Standing balance support: Bilateral upper extremity supported Standing balance-Leahy Scale: Fair Standing balance comment: Pt able to briefly maintain static standing with eyes closed but had wobble and need for UEs ~5 seconds                             Pertinent Vitals/Pain Pain Assessment: No/denies pain    Home Living Family/patient expects to be discharged to:: Private residence Living Arrangements: Other relatives (nephew and his girlfriend) Available Help at Discharge: Family;Available 24 hours/day (nephew works from home) Type of Home: Mobile home Home Access: Stairs to enter Entrance Stairs-Rails: Right Entrance Stairs-Number of Steps: 4 Home Layout: One level Home Equipment: None      Prior Function  Level of Independence: Independent         Comments: pt drives but reports she does not do any prolonged ambulation     Hand Dominance        Extremity/Trunk Assessment   Upper Extremity Assessment Upper Extremity Assessment: LUE deficits/detail (R grossly 4/5) LUE Deficits / Details: Pt with decreased strength (grossly 3/5) and coordination LUE Coordination: decreased gross motor;decreased fine motor    Lower Extremity Assessment Lower Extremity  Assessment: Overall WFL for tasks assessed       Communication   Communication: No difficulties  Cognition Arousal/Alertness: Awake/alert Behavior During Therapy: WFL for tasks assessed/performed Overall Cognitive Status: Within Functional Limits for tasks assessed                                        General Comments      Exercises     Assessment/Plan    PT Assessment Patient needs continued PT services  PT Problem List Decreased strength;Decreased range of motion;Decreased activity tolerance;Decreased balance;Decreased mobility;Decreased coordination;Decreased cognition;Decreased knowledge of use of DME;Decreased safety awareness;Cardiopulmonary status limiting activity       PT Treatment Interventions DME instruction;Gait training;Stair training;Functional mobility training;Therapeutic activities;Therapeutic exercise;Balance training;Neuromuscular re-education;Patient/family education    PT Goals (Current goals can be found in the Care Plan section)  Acute Rehab PT Goals Patient Stated Goal: go home PT Goal Formulation: With patient Time For Goal Achievement: 02/03/20 Potential to Achieve Goals: Fair    Frequency 7X/week   Barriers to discharge        Co-evaluation               AM-PAC PT "6 Clicks" Mobility  Outcome Measure Help needed turning from your back to your side while in a flat bed without using bedrails?: None Help needed moving from lying on your back to sitting on the side of a flat bed without using bedrails?: None Help needed moving to and from a bed to a chair (including a wheelchair)?: A Little Help needed standing up from a chair using your arms (e.g., wheelchair or bedside chair)?: A Little Help needed to walk in hospital room?: A Little Help needed climbing 3-5 steps with a railing? : A Lot 6 Click Score: 19    End of Session Equipment Utilized During Treatment: Gait belt Activity Tolerance: Patient limited by  fatigue;Patient tolerated treatment well Patient left: in bed;with call bell/phone within reach Nurse Communication: Mobility status PT Visit Diagnosis: Muscle weakness (generalized) (M62.81);Hemiplegia and hemiparesis;Other abnormalities of gait and mobility (R26.89);Difficulty in walking, not elsewhere classified (R26.2) Hemiplegia - Right/Left: Left Hemiplegia - dominant/non-dominant: Non-dominant Hemiplegia - caused by: Cerebral infarction    Time: 0910-0940 PT Time Calculation (min) (ACUTE ONLY): 30 min   Charges:   PT Evaluation $PT Eval Low Complexity: 1 Low PT Treatments $Gait Training: 8-22 mins        Kreg Shropshire, DPT 01/20/2020, 1:58 PM

## 2020-01-20 NOTE — Progress Notes (Signed)
Just ordered scd will apply when they arrive

## 2020-01-20 NOTE — Consult Note (Signed)
ANTICOAGULATION CONSULT NOTE - Consult  Pharmacy Consult for Heparin gtt Indication: stroke  Allergies  Allergen Reactions  . Wellbutrin [Bupropion] Rash    Patient Measurements: Height: 5\' 5"  (165.1 cm) Weight: (!) 155.1 kg (341 lb 14.9 oz) IBW/kg (Calculated) : 57 Heparin Dosing Weight: 96.4 kg  Vital Signs: Temp: 97.4 F (36.3 C) (12/14 0800) BP: 130/72 (12/14 0800) Pulse Rate: 67 (12/14 0800)  Labs: Recent Labs    01/19/20 1444  HGB 16.9*  HCT 50.7*  PLT 299  APTT 28  LABPROT 12.8  INR 1.0  CREATININE 0.71    Estimated Creatinine Clearance: 126.3 mL/min (by C-G formula based on SCr of 0.71 mg/dL).   Medications:  No AC prior to arrival. Pt on 81mg  daily aspirin. No pertinent drug allergies.  Assessment: Heparin Dosing Weight: 96.4 kg Heparin CVA/TIA - low-rate; no bolus (reduced anti-Xa goal of 0.3-0.5)  Per Neuro note: "...persistent symptoms, ED with MRI brain showed multifocal acute ischemia in the Rt hemisphere c/f watershed appearing pattern a/w petechial hemorrhage Heidelberg 1B with no mass-effect.  MRV of the head was done was negative for dural venous sinus thrombosis..."   Goal of Therapy:  Heparin level 0.3-0.5 units/ml Monitor platelets by anticoagulation protocol: Yes   Plan:  Initiating at ~12u/kg/hr without boluses ISO stroke.  Targeting reduced HL of 0.3-0.5. Start heparin infusion at 1150 units/hr Check anti-Xa level in 6 hours and daily while on heparin Continue to monitor H&H and platelets  Joanna [name] Joanna [name] 01/20/2020,1:28 PM

## 2020-01-20 NOTE — TOC Initial Note (Signed)
Transition of Care [facility]) - Initial/Assessment Note    Patient Details  Name: Joanna [name] MRN: [id number] Date of Birth: Feb 27, 1970  Transition of Care [facility]) CM/SW Contact:    Ova Freshwater Phone Number: [phone] 01/20/2020, 4:29 PM  Clinical Narrative:                  Received notice from OT their recommendation and the PT recommendation is for [facility].  CSW reached out to Attending and requested orders for PT/OT home health and DME, rolling walker and 3 in 1.  Attending confirmed orders        Patient Goals and CMS Choice        Expected Discharge Plan and Services                                                Prior Living Arrangements/Services                       Activities of Daily Living      Permission Sought/Granted                  Emotional Assessment              Admission diagnosis:  Acute CVA (cerebrovascular accident) [facility]) [I63.9] Patient Active Problem List   Diagnosis Date Noted  . Type 2 diabetes mellitus with hyperlipidemia ([place]) 01/20/2020  . Acute CVA (cerebrovascular accident) ([place]) 01/20/2020  . Non-intractable vomiting 10/30/2018  . Central abdominal mass 10/30/2018  . Constipation 10/30/2018  . Colon cancer screening 10/30/2018  . Abdominal pain, epigastric 10/30/2018  . Chronic pain of right knee 07/11/2016  . Cigarette nicotine dependence without complication 76/16/0737  . Sebaceous cyst of breast, right 03/07/2016  . OSA (obstructive sleep apnea) 10/26/2014  . GERD (gastroesophageal reflux disease) 10/26/2014  . Agoraphobia 09/28/2014  . Generalized anxiety disorder 09/28/2014  . Screening for HIV (human immunodeficiency virus) 09/15/2014  . Menometrorrhagia 09/15/2014  . PCOS (polycystic ovarian syndrome)   . CAD (coronary artery disease) 07/28/2014  . Leukocytosis 07/28/2014  . Family history of coronary artery disease in father 07/23/2014  . ST elevation myocardial infarction  (STEMI) involving right coronary artery with complication ([place]) 10/62/6948  . Interstitial lung disease ([place])   . Uncontrolled diabetes mellitus ([place])   . Hypertension   . Morbid obesity with BMI of 50.0-59.9, adult ([place])   . Non compliance w medication regimen   . Depression   . Smoker    PCP:  Pcp, No Pharmacy:   [place] [address] [place] [facility] [address] [place] [address] Phone: [phone] Fax: [phone]     Social Determinants of Health (SDOH) Interventions    Readmission Risk Interventions No flowsheet data found.

## 2020-01-20 NOTE — Evaluation (Signed)
Occupational Therapy Evaluation Patient Details Name: Joanna [name] MRN: [id number] DOB: [birth year] Today's Date: 01/20/2020    History of Present Illness 49 y.o. female past medical history of diabetes, obesity, PCOS, sleep apnea, hypertension, hyperlipidemia, depression and anxiety, presented to the emergency room for evaluation of headache and changes with her vision and balance.  Her last known well was sometime on Friday, 01/16/2020 when she noted sudden onset of a headache which was on the right side of her head which was very persistent and did not get relieved.   Clinical Impression   Pt seen for OT evaluation this date. Pt is from home where she lives with her nephew and nephew's girlfriend in a 1 story home. Pt ambulates independently short distances, able to drive, and denies difficulty with ADL at baseline. Currently, pt endorsing difficulty with vision and LUE functional use. Pt notes intermittently getting "flashes of light" that are very distracting in her L peripheral vision and worse when attempting to visual concentrate on things on her L side. Pt demo'd difficulty with visual scanning exercise requiring instruction and cues for anchoring vision to L side to ensure no missed items. Pt endorsed also having difficulty with depth perception as well as feeling as though her brain takes a few extra seconds to recognize her LUE as her own. Per chart review, pt having difficulty with identifying her phone. When presented with a visual picture in front of her she demo's difficulty with visually locating and scanning item, requiring cues head turns to the right to improve attempt. Pt reports LUE sometimes startles her. Pt instructed in visual/inattention compensatory strategies for ADL and mobility. Pt will benefit from skilled OT services to maximize return to PLOF and minimize future falls risk and increased caregiver burden. Recommend [place] at discharge. Encouraged pt to speak with MD  Regarding when/if she can return to driving.     Follow Up Recommendations  Home health OT    Equipment Recommendations  Other (comment) Management consultant)    Recommendations for Other Services       Precautions / Restrictions Precautions Precautions: Fall Restrictions Weight Bearing Restrictions: No      Mobility Bed Mobility Overal bed mobility: Modified Independent             General bed mobility comments: Pt was able to get to sitting EOB w/o assist w/o bed rail    Transfers Overall transfer level: Modified independent Equipment used: Rolling walker (2 wheeled)             General transfer comment: Pt is able to rise to standing w/o direct phyiscal assist, reliant on walker with poor confidence    Balance Overall balance assessment: Needs assistance Sitting-balance support: No upper extremity supported Sitting balance-Leahy Scale: Good     Standing balance support: Bilateral upper extremity supported Standing balance-Leahy Scale: Fair Standing balance comment: Pt able to briefly maintain static standing with eyes closed but had wobble and need for UEs ~5 seconds                           ADL either performed or assessed with clinical judgement   ADL Overall ADL's : Needs assistance/impaired                                       General ADL Comments: Pt sat EOB with LE propped on  bed to doff/don socks/shoes, difficulty noted     Vision Baseline Vision/History: Wears glasses Wears Glasses: Distance only Patient Visual Report: Other (comment);Peripheral vision impairment Vision Assessment?: Yes Eye Alignment: Within Functional Limits Ocular Range of Motion: Within Functional Limits Alignment/Gaze Preference: Within Defined Limits Tracking/Visual Pursuits: Able to track stimulus in all quads without difficulty Visual Fields: No apparent deficits Additional Comments: Pt notes intermittent flashing lights in L peripheral vision  in L eye only; has difficulty with hand eye coordination, and visually locating items on L side, no hemianopsia appreciated     Perception     Praxis      Pertinent Vitals/Pain Pain Assessment: No/denies pain     Hand Dominance Right   Extremity/Trunk Assessment Upper Extremity Assessment Upper Extremity Assessment: LUE deficits/detail LUE Deficits / Details: Pt with decreased strength (grossly 3/5) and coordination, sensation intact LUE Coordination: decreased gross motor;decreased fine motor   Lower Extremity Assessment Lower Extremity Assessment: Overall WFL for tasks assessed       Communication Communication Communication: No difficulties   Cognition Arousal/Alertness: Awake/alert Behavior During Therapy: WFL for tasks assessed/performed Overall Cognitive Status: Within Functional Limits for tasks assessed                                 General Comments: delayed response time and occasional verbal cues noted to improve visual attention to L side   General Comments       Exercises Other Exercises Other Exercises: Pt educated in falls prevention strategies, visual and attention compensatory strategies, and [facility] ex/activities for ADL and IADL Tasks to maximize return to PLOF   Shoulder Instructions      Home Living Family/patient expects to be discharged to:: Private residence Living Arrangements: Other relatives (nephew and his girlfriend) Available Help at Discharge: Family;Available 24 hours/day (nephew works from home) Type of Home: Mobile home Home Access: Stairs to enter Technical brewer of Steps: 4 Entrance Stairs-Rails: Right Home Layout: One level               Home Equipment: None          Prior Functioning/Environment Level of Independence: Independent        Comments: pt drives but reports she does not do any prolonged ambulation        OT Problem List: Decreased strength;Decreased coordination;Decreased  cognition;Decreased safety awareness;Impaired balance (sitting and/or standing);Decreased knowledge of use of DME or AE;Impaired vision/perception      OT Treatment/Interventions: Self-care/ADL training;Therapeutic exercise;Therapeutic activities;Neuromuscular education;Cognitive remediation/compensation;Visual/perceptual remediation/compensation;DME and/or AE instruction;Patient/family education;Balance training    OT Goals(Current goals can be found in the care plan section) Acute Rehab OT Goals Patient Stated Goal: go home OT Goal Formulation: With patient Time For Goal Achievement: 02/03/20 Potential to Achieve Goals: Good ADL Goals Pt Will Perform Grooming: with supervision;standing (incorporating LUE into task equally) Additional ADL Goal #1: Pt will utilize learned visual/attention compensatory strategies during ADL or IADL TAsks requiring min verbal cues to initiate.  OT Frequency: Min 2X/week   Barriers to D/C:            Co-evaluation              AM-PAC OT "6 Clicks" Daily Activity     Outcome Measure Help from another person eating meals?: A Little Help from another person taking care of personal grooming?: A Little Help from another person toileting, which includes using toliet, bedpan, or urinal?: A Little  Help from another person bathing (including washing, rinsing, drying)?: A Little Help from another person to put on and taking off regular upper body clothing?: A Little Help from another person to put on and taking off regular lower body clothing?: A Little 6 Click Score: 18   End of Session    Activity Tolerance: Patient tolerated treatment well Patient left: in bed;with call bell/phone within reach;with nursing/sitter in room  OT Visit Diagnosis: Other abnormalities of gait and mobility (R26.89);Other symptoms and signs involving cognitive function;Hemiplegia and hemiparesis Hemiplegia - Right/Left: Left Hemiplegia - dominant/non-dominant:  Non-Dominant Hemiplegia - caused by: Cerebral infarction                Time: 1413-1445 OT Time Calculation (min): 32 min Charges:  OT General Charges $OT Visit: 1 Visit OT Evaluation $OT Eval Moderate Complexity: 1 Mod OT Treatments $Neuromuscular Re-education: 8-22 mins  Jeni Salles, MPH, MS, OTR/L ascom [phone] 01/20/20, 3:39 PM

## 2020-01-20 NOTE — ED Notes (Signed)
Ultrasound at bedside at this time.

## 2020-01-20 NOTE — Progress Notes (Deleted)
Dc instructions given to patient and daughter they verbalize understanding, iv dc'd tolerated well pt will leave via w/c. Pt in apparent stable condition.

## 2020-01-20 NOTE — Progress Notes (Signed)
SLP Cancellation Note  Patient Details Name: Detria Cummings MRN: [id number] DOB: [birth year]   Cancelled treatment:       Reason Eval/Treat Not Completed: SLP screened, no needs identified, will sign off   Per chart review and pt report, her cognitive linguistic is back at baseline.   Kebrina Friend B. Rutherford Nail M.S., CCC-SLP, [place] Office [phone]  Stormy Fabian 01/20/2020, 3:06 PM

## 2020-01-20 NOTE — Progress Notes (Signed)
Patient ID: Joanna [name], female   DOB: 1970/03/17, 49 y.o.   MRN: [id number] This is a no charge note as patient was admitted this AM.  H&P reviewed, patient seen and examined.  Joanna [name] is a 49 y.o. female with medical history significant for DM with neuropathy, HTN, morbid obesity, arthritis, OSA, CAD, depression, who presents to the emergency room with a 3-day history of confusion which she describes as looking at her phone and not knowing what to do with it, not recognizing that her hand is her own hand and seeing flashing and flickering out of the corner of her eye.  She denies one-sided numbness weakness or tingling.  She delayed coming to the emergency room as she thought her symptoms would go away.  vss  nad cta no w/r/r Regular , s1/s2 abd bening LUE/LLE strenght 4/5, rt 5/5  A/P: Acute CVA CTA obtained found with thrombus from the right common carotid going into the right internal carotid.   -Per neurology, patient is going to be started on heparin drip (no bolus),  stop aspirin.  She will need to be on heparin drip for 36 to 48 hours and then repeat CT needs to be done to ensure that there has been no frank hemorrhage conversion of the stroke.  If all stays good, can convert anticoagulation to Eliquis. Will consult cardiology for TEE to rule out thrombus. NPO at MN in case going for TEE in am

## 2020-01-20 NOTE — Progress Notes (Signed)
pt has order for NPO after MN dietary did not bring up tray stating there is no order for dinner, page to md to get dinner order. Will pass to next shift

## 2020-01-20 NOTE — Consult Note (Addendum)
Neurology Consultation  Reason for Consult: Stroke Referring Physician: Dr. Judd Gaudier, hospitalist  CC: Headache, feeling unwell and altered  History is obtained from: Patient, chart  HPI: Joanna [name] is a 49 y.o. female past medical history of diabetes, obesity, PCOS, sleep apnea, hypertension, hyperlipidemia, depression and anxiety, presented to the emergency room for evaluation of headache and changes with her vision and balance.  Her last known well was sometime on Friday, 01/16/2020 when she noted sudden onset of a headache which was on the right side of her head which was very persistent and did not get relieved with any medications.  Headache was pounding in quality.  Shortly after, when she tried to get up, she noted that she could not move the left side of her body very well.  It also took her a while to identify her own left hand at that time.  Also noted difficulty with her gait.  The symptoms gradually resolved but she continued to feel that she was not able to move the left side of her body as well as she could move the right side.  She also felt some difficulty in her visual fields on the left. Due to the persistent symptoms, she came to the emergency room where she was evaluated with MR imaging of the brain which showed multifocal acute ischemia in the right hemisphere concerning for watershed appearing pattern with associated petechial hemorrhage Heidelberg 1B with no mass-effect.  MRV of the head was done which was negative for dural venous sinus thrombosis.  Patient reports a cousin-mother's sister's daughter, who had a stroke in early 40s, and another relative who had stroke at early age. Patient is a tobacco abuser.  She has not taken any of her medications for over 1 year as she has not seen her doctor in that time. She is not on any hormonal treatment for PCOS. Her lifestyle is mostly sedentary.  She is also security disability.  LKW: Sometime on Friday, 01/16/2020 tpa  given?: no, outside the window Premorbid modified Rankin scale (mRS): 1  ROS: Performed and negative except noted in HPI  Past Medical History:  Diagnosis Date  . Anxiety    Dx at age 25  . Arthritis    knee  . CAD (coronary artery disease)    a. Inferior STEMI 07/2014 - s/p thrombectomy of RCA, no stenting.  . Depression    at age 75; bipolar recent  . Diabetes mellitus without complication ([place])   . Diabetes type 2, uncontrolled ([place]) Dx 2010  . DJD (degenerative joint disease)   . Gout   . Hyperlipidemia Dx 2010  . Hypertension    Dx at age 50  . IBS (irritable bowel syndrome)   . Interstitial lung disease ([place]) Dx 2011   No biopsy  . Lung disorder Dx 2011  . Morbid obesity ([place])   . Neuropathy   . Non compliance w medication regimen   . PCOS (polycystic ovarian syndrome) 1988   no children   . Sleep apnea   . STEMI (ST elevation myocardial infarction) ([place]) 07/23/14  . Tobacco abuse     Family History  Problem Relation Age of Onset  . Coronary artery disease Father 40       CABG  . Lung cancer Father   . Heart disease Father   . Diabetes Father   . Hypertension Father   . Hyperlipidemia Father   . Drug abuse Father   . Cancer Father  lung  . Lung cancer Mother   . Depression Mother   . Hypertension Mother   . Drug abuse Mother   . Cancer Mother        lung cancer  . Liver disease Brother        alcoholic cirrhosis >  HCC  . COPD Brother   . Asthma Brother   . Depression Brother   . Stroke Brother   . Drug abuse Brother   . Diabetes Sister   . Thyroid disease Sister   . Depression Sister   . Heart disease Sister   . Hypertension Sister   . Cirrhosis Maternal Aunt   . Cirrhosis Maternal Uncle   . Breast cancer Paternal Aunt   . Cancer Maternal Grandmother   . Emphysema Maternal Grandfather   . COPD Paternal Grandmother   . Rheumatologic disease Neg Hx   . Colon cancer Neg Hx   . Colon polyps Neg Hx      Social History:   reports  that she has been smoking cigarettes. She started smoking about 36 years ago. She has a 31.00 pack-year smoking history. She has never used smokeless tobacco. She reports that she does not drink alcohol and does not use drugs.  Medications  Current Facility-Administered Medications:  .   stroke: mapping our early stages of recovery book, , Does not apply, Once, Athena Masse, MD .  acetaminophen (TYLENOL) tablet 650 mg, 650 mg, Oral, Q4H PRN **OR** acetaminophen (TYLENOL) 160 MG/5ML solution 650 mg, 650 mg, Per Tube, Q4H PRN **OR** acetaminophen (TYLENOL) suppository 650 mg, 650 mg, Rectal, Q4H PRN, Judd Gaudier V, MD .  insulin aspart (novoLOG) injection 0-15 Units, 0-15 Units, Subcutaneous, TID WC, Athena Masse, MD, 5 Units at 01/20/20 0940 .  insulin aspart (novoLOG) injection 0-5 Units, 0-5 Units, Subcutaneous, QHS, Damita Dunnings, Waldemar Dickens, MD No current outpatient medications on file.   Exam: Current vital signs: BP 130/72   Pulse 67   Temp (!) 97.4 F (36.3 C)   Resp 19   Ht 5\' 5"  (1.651 m)   Wt (!) 155.1 kg   SpO2 95%   BMI 56.90 kg/m  Vital signs in last 24 hours: Temp:  [97.4 F (36.3 C)-98.7 F (37.1 C)] 97.4 F (36.3 C) (12/14 0800) Pulse Rate:  [67-87] 67 (12/14 0800) Resp:  [16-21] 19 (12/14 0500) BP: (122-167)/(53-99) 130/72 (12/14 0800) SpO2:  [88 %-96 %] 95 % (12/14 0800) Weight:  [155.1 kg] 155.1 kg (12/13 1440) General: Awake alert in no distress HEENT: Normocephalic atraumatic with hirsutism CVS: Regular rhythm Abdomen obese nontender Extremities warm well perfused with trace edema Chest clear to auscultation Neurological exam Awake alert oriented x3 No aphasia No dysarthria Cranial nerves: Pupils equal round reactive light, extraocular movements intact, visual field examination reveals a left lower quadrantanopsia, face is symmetric and facial sensation is intact, tongue and palate are midline. Motor examination reveals no drift in any of the 4  extremities but individual muscle testing shows slight weakness 4+/5 in the left upper extremity as well as the left lower extremity.  She also has difficulty performing rapid finger taps on the left hand.  Right upper and lower extremity are full strength. Sensation: Intact to light touch without extinction. Coordination: No dysmetria although she is slow to perform finger-nose-finger testing on the left. NIH stroke scale 1a Level of Conscious.: 0 1b LOC Questions: 0 1c LOC Commands: 0 2 Best Gaze: 0 3 Visual: 1 4 Facial Palsy: 0 5a  Motor Arm - left: 0 5b Motor Arm - Right: 0 6a Motor Leg - Left: 0 6b Motor Leg - Right: 0 7 Limb Ataxia: 0 8 Sensory: 0 9 Best Language: 0 10 Dysarthria: 0 11 Extinct. and Inatten.: 0 TOTAL: 1   Labs I have reviewed labs in epic and the results pertinent to this consultation are:  CBC    Component Value Date/Time   WBC 15.0 (H) 01/19/2020 1444   RBC 6.00 (H) 01/19/2020 1444   HGB 16.9 (H) 01/19/2020 1444   HCT 50.7 (H) 01/19/2020 1444   PLT 299 01/19/2020 1444   MCV 84.5 01/19/2020 1444   MCH 28.2 01/19/2020 1444   MCHC 33.3 01/19/2020 1444   RDW 12.9 01/19/2020 1444   LYMPHSABS 3.5 01/19/2020 1444   MONOABS 0.8 01/19/2020 1444   EOSABS 0.1 01/19/2020 1444   BASOSABS 0.1 01/19/2020 1444    CMP     Component Value Date/Time   NA 135 01/19/2020 1444   K 4.1 01/19/2020 1444   CL 96 (L) 01/19/2020 1444   CO2 28 01/19/2020 1444   GLUCOSE 301 (H) 01/19/2020 1444   BUN 6 01/19/2020 1444   CREATININE 0.71 01/19/2020 1444   CREATININE 0.89 10/30/2018 1016   CALCIUM 9.3 01/19/2020 1444   PROT 7.9 01/19/2020 1444   ALBUMIN 3.4 (L) 01/19/2020 1444   AST 25 01/19/2020 1444   ALT 26 01/19/2020 1444   ALKPHOS 109 01/19/2020 1444   BILITOT 0.7 01/19/2020 1444   GFRNONAA >60 01/19/2020 1444   GFRNONAA 76 10/30/2018 1016   GFRAA 88 10/30/2018 1016    Lipid Panel     Component Value Date/Time   CHOL 190 01/20/2020 0435   TRIG 372 (H)  01/20/2020 0435   HDL 23 (L) 01/20/2020 0435   CHOLHDL 8.3 01/20/2020 0435   VLDL 74 (H) 01/20/2020 0435   LDLCALC 93 01/20/2020 0435  LDL 93 A1c-pending Echo pending   Imaging I have reviewed the images obtained: MRI examination of the brain-right hemispheric acute ischemic strokes and watershed pattern petechial hemorrhage. MRV negative for thrombosis. Dopplers with 50 to 69% right carotid stenosis.  Left carotid less than 50% stenosis.  Vertebral arteries antegrade bilaterally.  Assessment: 49 year old woman with above past medical history presenting with sudden onset of headache, balance problems-primarily left-sided weakness and left-sided neglect found to have right hemispheric acute strokes in a watershed distribution. Carotid Dopplers with a 50 to 69% right carotid stenosis making this likely etiology for her strokes. Has other risk factors-diabetes, hypertension, tobacco abuse, hyperlipidemia. Also has leukocytosis-could have had hypotension that could have led to hypoperfusion and strokes in the setting of the right carotid stenosis.  Needs full work-up.  Impression: Acute ischemic stroke  Recommendations: -Admit to hospitalist -Frequent neurochecks -Usually, this far out from her last known well, there would be no need for permissive hypertension but in her case, given her carotid stenosis pending further work-up, I would allow for permissive hypertension and treat only if systolic blood pressures go above 220. -She is not on any antiplatelets-and with MRI showing some petechial hemorrhage, I would only do aspirin 81 for now.  If she had no petechial hemorrhage, I would have gone straight for dual antiplatelet treatment. -CT angiogram head and neck.  -2D echo -A1c -Atorvastatin 80-goal LDL less than 70. -PT OT speech therapy -N.p.o. until cleared by bedside swallow evaluation -Her age of 67 is at the cusp of what would be a cut off for considering hypercoagulable  work-up,  but given her multiple other comorbidities, I would not pursue a hypercoagulable work-up just about yet. -Lower extremity Dopplers. I will continue to follow with you.  -- Amie Portland, MD [facility] Pager: [phone] If 7pm to 7am, please call on call as listed on AMION.    Addendum CTA of the head and neck completed- Abnormal scan There is a rounded focal filling defect within the distal right common carotid artery extending into the proximal right internal carotid artery concerning for intraluminal thrombus. No LVO. Partially visualized opacities in the lung apices which could represent mild pulmonary edema or infection. Her strokes do already show mild petechial hemorrhage, hence would not do antiplatelet and anticoagulant-would just do anticoagulants as below.   Given these CT head and neck findings of intraluminal thrombus in the right common and internal carotid, updated recommendations are as below:  -Discontinue aspirin  -Start heparin drip stroke protocol-no bolus. -Monitor on heparin drip for 36 to 48 hours and repeat a head CT to evaluate for any evidence of bleeding/hemorrhagic conversion.  If no frank hemorrhagic conversion, can convert anticoagulation to p.o. Eliquis at that time.  Repeat CT head in 2 to 3 months, outpatient neurology follow-up for final determination of the duration of anticoagulation. -I would recommend an inpatient TEE to evaluate for any cardiac source of thrombus -I will also order hypercoagulable work-up  I will update the primary team  -- Amie Portland, MD [facility] Pager: [phone] If 7pm to 7am, please call on call as listed on AMION.

## 2020-01-20 NOTE — Progress Notes (Signed)
Spoke with sister Estill Bamberg and updated her

## 2020-01-20 NOTE — H&P (Signed)
History and Physical    Cesar Alf VHQ:[id number] DOB: [birth year] DOA: 01/19/2020  PCP: Pcp, No   Patient coming from: Home  I have personally briefly reviewed patient's old medical records in [place]  Chief Complaint: Confusion  HPI: Joanna [name] is a 49 y.o. female with medical history significant for DM with neuropathy, HTN, morbid obesity, arthritis, OSA, CAD, depression, who presents to the emergency room with a 3-day history of confusion which she describes as looking at her phone and not knowing what to do with it, not recognizing that her hand is her own hand and seeing flashing and flickering out of the corner of her eye.  She denies one-sided numbness weakness or tingling.  She delayed coming to the emergency room as she thought her symptoms would go away. ED Course: On arrival, blood pressure 167/99, heart rate 86, afebrile, O2 sat 93% on room air.  On her blood work, leukocytosis of 15,000, hemoglobin 16.9.  CMP within normal limits. EKG as reviewed by me : Sinus at 88 with T wave inversion in lateral leads Imaging: MRI/MRV showing multifocal acute ischemia in the right hemisphere in a watershed distribution with associated petechial hemorrhage without mass-effect  The emergency room provider spoke with neurosurgeon on-call who recommended neurology consult.  Avoid aspirin for now unless advised by neurology.  Hospitalist consulted for admission.  Review of Systems: As per HPI otherwise all other systems on review of systems negative.    Past Medical History:  Diagnosis Date  . Anxiety    Dx at age 49  . Arthritis    knee  . CAD (coronary artery disease)    a. Inferior STEMI 07/2014 - s/p thrombectomy of RCA, no stenting.  . Depression    at age 38; bipolar recent  . Diabetes mellitus without complication ([place])   . Diabetes type 2, uncontrolled ([place]) Dx 2010  . DJD (degenerative joint disease)   . Gout   . Hyperlipidemia Dx 2010  . Hypertension    Dx at age  51  . IBS (irritable bowel syndrome)   . Interstitial lung disease (IXL) Dx 2011   No biopsy  . Lung disorder Dx 2011  . Morbid obesity ([place])   . Neuropathy   . Non compliance w medication regimen   . PCOS (polycystic ovarian syndrome) 1988   no children   . Sleep apnea   . STEMI (ST elevation myocardial infarction) ([place]) 07/23/14  . Tobacco abuse     Past Surgical History:  Procedure Laterality Date  . CARDIAC CATHETERIZATION N/A 07/23/2014   Procedure: Left Heart Cath and Coronary Angiography;  Surgeon: Peter M Martinique, MD;  Location: [facility];  Service: Cardiovascular;  Laterality: N/A;  . CARDIAC CATHETERIZATION N/A 07/27/2014   Procedure: Left Heart Cath and Coronary Angiography;  Surgeon: Jettie Booze, MD;  Location: [facility];  Service: Cardiovascular;  Laterality: N/A;  . CARDIAC CATHETERIZATION N/A 07/27/2014   Procedure: Coronary Balloon Angioplasty;  Surgeon: Jettie Booze, MD;  Location: [facility];  Service: Cardiovascular;  Laterality: N/A;  thrombectomy only  . CORONARY ANGIOPLASTY       reports that she has been smoking cigarettes. She started smoking about 36 years ago. She has a 31.00 pack-year smoking history. She has never used smokeless tobacco. She reports that she does not drink alcohol and does not use drugs.  Allergies  Allergen Reactions  . Wellbutrin [Bupropion] Rash    Family History  Problem  Relation Age of Onset  . Coronary artery disease Father 39       CABG  . Lung cancer Father   . Heart disease Father   . Diabetes Father   . Hypertension Father   . Hyperlipidemia Father   . Drug abuse Father   . Cancer Father        lung  . Lung cancer Mother   . Depression Mother   . Hypertension Mother   . Drug abuse Mother   . Cancer Mother        lung cancer  . Liver disease Brother        alcoholic cirrhosis >  HCC  . COPD Brother   . Asthma Brother   . Depression Brother   . Stroke Brother   . Drug  abuse Brother   . Diabetes Sister   . Thyroid disease Sister   . Depression Sister   . Heart disease Sister   . Hypertension Sister   . Cirrhosis Maternal Aunt   . Cirrhosis Maternal Uncle   . Breast cancer Paternal Aunt   . Cancer Maternal Grandmother   . Emphysema Maternal Grandfather   . COPD Paternal Grandmother   . Rheumatologic disease Neg Hx   . Colon cancer Neg Hx   . Colon polyps Neg Hx       Prior to Admission medications   Medication Sig Start Date End Date Taking? Authorizing Provider  albuterol (PROVENTIL HFA;VENTOLIN HFA) 108 (90 Base) MCG/ACT inhaler Inhale into the lungs every 6 (six) hours as needed for wheezing or shortness of breath.    [provider]  amLODipine (NORVASC) 2.5 MG tablet Take 1 tablet (2.5 mg total) by mouth daily. Patient taking differently: Take 5 mg by mouth daily.  08/04/14   Lendon Colonel, NP  aspirin EC 81 MG tablet Take 1 tablet (81 mg total) by mouth daily. 07/28/14   Dunn, Nedra Hai, PA-C  atorvastatin (LIPITOR) 40 MG tablet Take 1 tablet (40 mg total) by mouth daily. 11/15/18 02/13/19  Herminio Commons, MD  baclofen (LIORESAL) 10 MG tablet Take 10 mg by mouth 3 (three) times daily.    [provider]  Blood Glucose Monitoring Suppl (TRUE METRIX METER) W/DEVICE KIT 1 each by Does not apply route 3 (three) times daily. 08/03/14   Charlott Rakes, MD  gabapentin (NEURONTIN) 600 MG tablet Take 600 mg by mouth 3 (three) times daily.    [provider]  glipiZIDE (GLUCOTROL) 10 MG tablet Take 10 mg by mouth daily before breakfast.    [provider]  glucose blood (TRUE METRIX BLOOD GLUCOSE TEST) test strip Use as instructed 08/03/14   Charlott Rakes, MD  insulin detemir (LEVEMIR) 100 UNIT/ML injection Inject 10 Units into the skin daily.    [provider]  levonorgestrel (MIRENA) 20 MCG/24HR IUD 1 each by Intrauterine route once.  08/28/12   [provider]  Liraglutide (VICTOZA [place]) Inject  0.06 Units into the skin.    [provider]  lisinopril-hydrochlorothiazide (PRINZIDE,ZESTORETIC) 20-25 MG tablet Take 1 tablet by mouth daily.    [provider]  metFORMIN (GLUCOPHAGE) 1000 MG tablet Take 1 tablet (1,000 mg total) by mouth 2 (two) times daily with a meal. 09/15/14   Funches, Josalyn, MD  metoprolol (LOPRESSOR) 50 MG tablet Take 1 tablet (50 mg total) by mouth 2 (two) times daily. 07/28/14   Dunn, Nedra Hai, PA-C  nitroGLYCERIN (NITROSTAT) 0.4 MG SL tablet Place 1 tablet (0.4  mg total) under the tongue every 5 (five) minutes as needed for chest pain (up to 3 doses). 07/28/14   Dunn, Nedra Hai, PA-C  ondansetron (ZOFRAN) 4 MG tablet Take 1 tablet (4 mg total) by mouth every 8 (eight) hours as needed for nausea or vomiting. 10/30/18   Erenest Rasher, PA-C  pantoprazole (PROTONIX) 40 MG tablet Take 1 tablet (40 mg total) by mouth daily. 09/28/14   Javier Glazier, MD  torsemide (DEMADEX) 5 MG tablet Take 1 tablet (5 mg total) by mouth daily. 11/15/18   Herminio Commons, MD  traZODone (DESYREL) 50 MG tablet Take 50 mg by mouth at bedtime.    [provider]  TRUEPLUS LANCETS 28G MISC 1 each by Does not apply route 3 (three) times daily. 09/03/14   Charlott Rakes, MD  venlafaxine (EFFEXOR) 75 MG tablet Take 150 mg by mouth daily.     [provider]    Physical Exam: Vitals:   01/19/20 2146 01/19/20 2302 01/19/20 2330 01/19/20 2339  BP: (!) 128/53 (!) 144/72    Pulse: 82 87    Resp: 16 18    Temp:      TempSrc:      SpO2: 92% 92% (!) 88% 96%  Weight:      Height:         Vitals:   01/19/20 2146 01/19/20 2302 01/19/20 2330 01/19/20 2339  BP: (!) 128/53 (!) 144/72    Pulse: 82 87    Resp: 16 18    Temp:      TempSrc:      SpO2: 92% 92% (!) 88% 96%  Weight:      Height:          Constitutional: Alert and oriented x 3 . Not in any apparent distress HEENT:      Head: Normocephalic and atraumatic.         Eyes: PERLA, EOMI,  Conjunctivae are normal. Sclera is non-icteric.       Mouth/Throat: Mucous membranes are moist.       Neck: Supple with no signs of meningismus. Cardiovascular: Regular rate and rhythm. No murmurs, gallops, or rubs. 2+ symmetrical distal pulses are present . No JVD. No LE edema Respiratory: Respiratory effort normal .Lungs sounds clear bilaterally. No wheezes, crackles, or rhonchi.  Gastrointestinal: Soft, non tender, and non distended with positive bowel sounds.  Genitourinary: No CVA tenderness. Musculoskeletal: Nontender with normal range of motion in all extremities. No cyanosis, or erythema of extremities. Neurologic:  Face is symmetric. Moving all extremities. No gross focal neurologic deficits . Skin: Skin is warm, dry.  No rash or ulcers Psychiatric: Mood and affect are normal    Labs on Admission: I have personally reviewed following labs and imaging studies  CBC: Recent Labs  Lab 01/19/20 1444  WBC 15.0*  NEUTROABS 10.4*  HGB 16.9*  HCT 50.7*  MCV 84.5  PLT 119   Basic Metabolic Panel: Recent Labs  Lab 01/19/20 1444  NA 135  K 4.1  CL 96*  CO2 28  GLUCOSE 301*  BUN 6  CREATININE 0.71  CALCIUM 9.3   GFR: Estimated Creatinine Clearance: 126.3 mL/min (by C-G formula based on SCr of 0.71 mg/dL). Liver Function Tests: Recent Labs  Lab 01/19/20 1444  AST 25  ALT 26  ALKPHOS 109  BILITOT 0.7  PROT 7.9  ALBUMIN 3.4*   No results for input(s): LIPASE, AMYLASE in the last 168 hours. No results for input(s): AMMONIA in the  last 168 hours. Coagulation Profile: Recent Labs  Lab 01/19/20 1444  INR 1.0   Cardiac Enzymes: No results for input(s): CKTOTAL, CKMB, CKMBINDEX, TROPONINI in the last 168 hours. BNP (last 3 results) No results for input(s): PROBNP in the last 8760 hours. HbA1C: No results for input(s): HGBA1C in the last 72 hours. CBG: No results for input(s): GLUCAP in the last 168 hours. Lipid Profile: No results for input(s): CHOL, HDL,  LDLCALC, TRIG, CHOLHDL, LDLDIRECT in the last 72 hours. Thyroid Function Tests: No results for input(s): TSH, T4TOTAL, FREET4, T3FREE, THYROIDAB in the last 72 hours. Anemia Panel: No results for input(s): VITAMINB12, FOLATE, FERRITIN, TIBC, IRON, RETICCTPCT in the last 72 hours. Urine analysis: No results found for: COLORURINE, APPEARANCEUR, LABSPEC, [place], GLUCOSEU, [place], [place], [place], PROTEINUR, UROBILINOGEN, NITRITE, LEUKOCYTESUR  Radiological Exams on Admission: CT HEAD WO CONTRAST  Result Date: 01/19/2020 CLINICAL DATA:  Neuro deficit, acute, stroke suspected. Additional history provided: Patient reports headache, double vision, confusion, symptoms for 3 days. EXAM: CT HEAD WITHOUT CONTRAST TECHNIQUE: Contiguous axial images were obtained from the base of the skull through the vertex without intravenous contrast. COMPARISON:  No pertinent prior exams available for comparison. FINDINGS: Brain: Cerebral volume is normal for age. There is no acute intracranial hemorrhage. No demarcated cortical infarct. No extra-axial fluid collection. No evidence of intracranial mass. No midline shift. Partially empty sella turcica. Incidentally noted cavum septum pellucidum. Vascular: No hyperdense vessel.  Atherosclerotic calcifications. Skull: Normal. Negative for fracture or focal lesion. Sinuses/Orbits: Visualized orbits show no acute finding. No significant paranasal sinus disease at the imaged levels. IMPRESSION: No evidence of acute intracranial abnormality. Partially empty sella turcica. This finding is very commonly incidental, but can be associated with idiopathic intracranial hypertension. Electronically Signed   By: Kellie Simmering DO   On: 01/19/2020 15:10   MR BRAIN WO CONTRAST  Result Date: 01/20/2020 CLINICAL DATA:  headache and vision change EXAM: MRI HEAD WITHOUT CONTRAST MRV HEAD WITHOUT AND WITH CONTRAST TECHNIQUE: Multiplanar, multiecho pulse sequences of the brain and surrounding  structures were obtained without intravenous contrast. Angiographic images of the intracranial venous structures were obtained using MRV technique without and with intravenous contrast. COMPARISON:  None. FINDINGS: MRI HEAD WITHOUT CONTRAST Brain: There is multifocal abnormal diffusion restriction in the right hemisphere in a watershed distribution. There is associated magnetic susceptibility effect in the right frontal, occipital and posterior parietal lobes . No midline shift or other mass effect. There is mild contrast enhancement associated with the sites. Normal white matter signal, parenchymal volume and CSF spaces. The midline structures are normal. Vascular: Major flow voids are preserved. Skull and upper cervical spine: Normal calvarium and skull base. Visualized upper cervical spine and soft tissues are normal. Sinuses/Orbits:No paranasal sinus fluid levels or advanced mucosal thickening. No mastoid or middle ear effusion. Normal orbits. MR VENOGRAM WITHOUT CONTRAST Superior sagittal sinus: Normal. Straight sinus: Normal. Inferior sagittal sinus, vein of Galen and internal cerebral veins: Normal. Transverse sinuses: Normal. Sigmoid sinuses: Normal. Visualized jugular veins: Normal. IMPRESSION: 1. Multifocal acute ischemia in the right hemisphere in a watershed distribution. Associated petechial hemorrhage Heidelberg classification 1b: HI2, confluent petechiae, no mass effect. 2. Contrast enhancement at the sites of abnormal diffusion restriction are likely due to blood brain barrier breakdown in the setting of ischemia. 3. Normal MRV of the head. Electronically Signed   By: Ulyses Jarred M.D.   On: 01/20/2020 00:58   MR MRV HEAD W WO CONTRAST  Result Date: 01/20/2020 CLINICAL DATA:  headache and vision change EXAM: MRI HEAD WITHOUT CONTRAST MRV HEAD WITHOUT AND WITH CONTRAST TECHNIQUE: Multiplanar, multiecho pulse sequences of the brain and surrounding structures were obtained without intravenous  contrast. Angiographic images of the intracranial venous structures were obtained using MRV technique without and with intravenous contrast. COMPARISON:  None. FINDINGS: MRI HEAD WITHOUT CONTRAST Brain: There is multifocal abnormal diffusion restriction in the right hemisphere in a watershed distribution. There is associated magnetic susceptibility effect in the right frontal, occipital and posterior parietal lobes . No midline shift or other mass effect. There is mild contrast enhancement associated with the sites. Normal white matter signal, parenchymal volume and CSF spaces. The midline structures are normal. Vascular: Major flow voids are preserved. Skull and upper cervical spine: Normal calvarium and skull base. Visualized upper cervical spine and soft tissues are normal. Sinuses/Orbits:No paranasal sinus fluid levels or advanced mucosal thickening. No mastoid or middle ear effusion. Normal orbits. MR VENOGRAM WITHOUT CONTRAST Superior sagittal sinus: Normal. Straight sinus: Normal. Inferior sagittal sinus, vein of Galen and internal cerebral veins: Normal. Transverse sinuses: Normal. Sigmoid sinuses: Normal. Visualized jugular veins: Normal. IMPRESSION: 1. Multifocal acute ischemia in the right hemisphere in a watershed distribution. Associated petechial hemorrhage Heidelberg classification 1b: HI2, confluent petechiae, no mass effect. 2. Contrast enhancement at the sites of abnormal diffusion restriction are likely due to blood brain barrier breakdown in the setting of ischemia. 3. Normal MRV of the head. Electronically Signed   By: Ulyses Jarred M.D.   On: 01/20/2020 00:58     Assessment/Plan 49 year old female with history of DM with neuropathy, HTN, morbid obesity, arthritis, OSA, CAD, depression, who presents to the emergency room with a 3-day history of confusion .    Acute CVA (cerebrovascular accident) [facility]) -Patient presents outside of TPA window with 3-day history of confusion, not  recognizing familiar objects like a phone not recognizing like her hand is hers -MRI MRV showing multifocal acute ischemia right hemisphere watershed distribution with associated petechial hemorrhage -Avoid aspirin, Lovenox DVT prophylaxis for now per recommendation of neurosurgery per conversation with ER provider -Permissive hypertension -Statins -Continuous cardiac monitoring to evaluate for arrhythmias, echocardiogram and carotid Doppler -Neurology consult -PT OT and speech consults    Hypertension -Hold antihypertensives to allow for permissive hypertension.    Morbid obesity with BMI of 50.0-59.9, adult (HCC) -Complicating factor to overall prognosis and care    CAD (coronary artery disease) -Stable no complaints of chest pain -Continue metoprolol and atorvastatin.  Aspirin on hold due to petechial hemorrhages on MRI    OSA (obstructive sleep apnea) -BiPAP    Cigarette nicotine dependence without complication -Nicotine patch if desired    Type 2 diabetes mellitus with hyperlipidemia (HCC) -Sliding scale insulin coverage  Depression -Continue venlafaxine     DVT prophylaxis: SCDs Code Status: full code  Family Communication:  none  Disposition Plan: Back to previous home environment Consults called: Neurology Status: Observation    Athena Masse MD [facility]     01/20/2020, 2:04 AM

## 2020-01-20 NOTE — ED Notes (Signed)
Pt is sitting up eating dinner tray. Pt to be NPO after midnight. Pt denies pain at this time.

## 2020-01-20 NOTE — ED Notes (Signed)
Pt states that this past Friday she started to have headaches and some dizziness. Pt laying on bed, on 2LPM via [place] of oxygen. Pt on cardiac, bp and pulse ox monitor.

## 2020-01-20 NOTE — Progress Notes (Signed)
Inpatient Diabetes Program Recommendations  AACE/ADA: New Consensus Statement on Inpatient Glycemic Control (2015)  Target Ranges:  Prepandial:   less than 140 mg/dL      Peak postprandial:   less than 180 mg/dL (1-2 hours)      Critically ill patients:  140 - 180 mg/dL   Lab Results  Component Value Date   GLUCAP 259 (H) 01/20/2020   HGBA1C 11.9 (H) 07/23/2014    Review of Glycemic Control Results for Joanna, [name] (MRN [id number]) as of 01/20/2020 09:56  Ref. Range 01/20/2020 09:02  Glucose-Capillary Latest Ref Range: 70 - 99 mg/dL 259 (H)   Diabetes history: Type 2 DM Outpatient Diabetes medications: Glipizide 10 mg QAM, Levemir 10 units QD Current orders for Inpatient glycemic control: Novolog 0-15 units TID, Novolog 0-5 units QHS A1C in process  Inpatient Diabetes Program Recommendations:    Consider adding Levemir 15 units QD (155 kg x 0.1).   Thanks, Bronson Curb, MSN, RNC-OB Diabetes Coordinator [phone] (8a-5p)

## 2020-01-20 NOTE — Progress Notes (Signed)
scds applied

## 2020-01-20 NOTE — Consult Note (Signed)
ANTICOAGULATION CONSULT NOTE - Consult  Pharmacy Consult for Heparin gtt Indication: stroke  Allergies  Allergen Reactions  . Wellbutrin [Bupropion] Rash    Patient Measurements: Height: 5\' 5"  (165.1 cm) Weight: (!) 155.1 kg (341 lb 14.9 oz) IBW/kg (Calculated) : 57 Heparin Dosing Weight: 96.4 kg  Vital Signs: Temp: 98.3 F (36.8 C) (12/14 2014) BP: 130/92 (12/14 2138) Pulse Rate: 68 (12/14 2138)  Labs: Recent Labs    01/19/20 1444 01/20/20 2000  HGB 16.9*  --   HCT 50.7*  --   PLT 299  --   APTT 28  --   LABPROT 12.8  --   INR 1.0  --   HEPARINUNFRC  --  <0.10*  CREATININE 0.71  --     Estimated Creatinine Clearance: 126.3 mL/min (by C-G formula based on SCr of 0.71 mg/dL).   Medications:  No AC prior to arrival. Pt on 81mg  daily aspirin. No pertinent drug allergies.  Assessment: Heparin Dosing Weight: 96.4 kg Heparin CVA/TIA - low-rate; no bolus (reduced anti-Xa goal of 0.3-0.5)  Per Neuro note: "...persistent symptoms, ED with MRI brain showed multifocal acute ischemia in the Rt hemisphere c/f watershed appearing pattern a/w petechial hemorrhage Heidelberg 1B with no mass-effect.  MRV of the head was done was negative for dural venous sinus thrombosis..."  12/14 2000 HL < 0.1    Goal of Therapy:  Heparin level 0.3-0.5 units/ml Monitor platelets by anticoagulation protocol: Yes   Plan:  Heparin level is subtherapeutic. Will increase heparin infusion to 1450 units/hr. No bolus due to stroke. Recheck heparin level in 6 hours. CBC daily while on heparin.   Oswald Hillock, PharmD, BCPS 01/20/2020,9:41 PM

## 2020-01-21 ENCOUNTER — Observation Stay (HOSPITAL_COMMUNITY)
Admit: 2020-01-21 | Discharge: 2020-01-21 | Disposition: A | Discharge status: Home / Self Care | Payer: Medicaid Other | Attending: Internal Medicine | Admitting: Internal Medicine

## 2020-01-21 ENCOUNTER — Encounter
Admission: EM | Disposition: A | Discharge status: Home / Self Care | Payer: Self-pay | Source: Home / Self Care | Attending: Internal Medicine

## 2020-01-21 ENCOUNTER — Other Ambulatory Visit: Payer: Self-pay

## 2020-01-21 ENCOUNTER — Inpatient Hospital Stay (HOSPITAL_COMMUNITY)
Admit: 2020-01-21 | Discharge: 2020-01-21 | Disposition: A | Discharge status: Home / Self Care | Payer: Medicaid Other | Attending: Physician Assistant | Admitting: Physician Assistant

## 2020-01-21 ENCOUNTER — Encounter: Payer: Self-pay | Admitting: Internal Medicine

## 2020-01-21 DIAGNOSIS — I6389 Other cerebral infarction: Secondary | ICD-10-CM

## 2020-01-21 DIAGNOSIS — R233 Spontaneous ecchymoses: Secondary | ICD-10-CM | POA: Diagnosis present

## 2020-01-21 DIAGNOSIS — K219 Gastro-esophageal reflux disease without esophagitis: Secondary | ICD-10-CM | POA: Diagnosis present

## 2020-01-21 DIAGNOSIS — E1140 Type 2 diabetes mellitus with diabetic neuropathy, unspecified: Secondary | ICD-10-CM | POA: Diagnosis present

## 2020-01-21 DIAGNOSIS — I252 Old myocardial infarction: Secondary | ICD-10-CM | POA: Diagnosis not present

## 2020-01-21 DIAGNOSIS — Z20822 Contact with and (suspected) exposure to covid-19: Secondary | ICD-10-CM | POA: Diagnosis present

## 2020-01-21 DIAGNOSIS — I340 Nonrheumatic mitral (valve) insufficiency: Secondary | ICD-10-CM

## 2020-01-21 DIAGNOSIS — M109 Gout, unspecified: Secondary | ICD-10-CM | POA: Diagnosis present

## 2020-01-21 DIAGNOSIS — F419 Anxiety disorder, unspecified: Secondary | ICD-10-CM | POA: Diagnosis present

## 2020-01-21 DIAGNOSIS — Z9861 Coronary angioplasty status: Secondary | ICD-10-CM | POA: Diagnosis not present

## 2020-01-21 DIAGNOSIS — K589 Irritable bowel syndrome without diarrhea: Secondary | ICD-10-CM | POA: Diagnosis present

## 2020-01-21 DIAGNOSIS — R29701 NIHSS score 1: Secondary | ICD-10-CM | POA: Diagnosis present

## 2020-01-21 DIAGNOSIS — I10 Essential (primary) hypertension: Secondary | ICD-10-CM

## 2020-01-21 DIAGNOSIS — I639 Cerebral infarction, unspecified: Secondary | ICD-10-CM | POA: Diagnosis present

## 2020-01-21 DIAGNOSIS — J849 Interstitial pulmonary disease, unspecified: Secondary | ICD-10-CM | POA: Diagnosis present

## 2020-01-21 DIAGNOSIS — R414 Neurologic neglect syndrome: Secondary | ICD-10-CM | POA: Diagnosis present

## 2020-01-21 DIAGNOSIS — X58XXXA Exposure to other specified factors, initial encounter: Secondary | ICD-10-CM | POA: Diagnosis present

## 2020-01-21 DIAGNOSIS — Z6841 Body Mass Index (BMI) 40.0 and over, adult: Secondary | ICD-10-CM | POA: Diagnosis not present

## 2020-01-21 DIAGNOSIS — E1169 Type 2 diabetes mellitus with other specified complication: Secondary | ICD-10-CM | POA: Diagnosis present

## 2020-01-21 DIAGNOSIS — E1165 Type 2 diabetes mellitus with hyperglycemia: Secondary | ICD-10-CM | POA: Diagnosis present

## 2020-01-21 DIAGNOSIS — Z8249 Family history of ischemic heart disease and other diseases of the circulatory system: Secondary | ICD-10-CM | POA: Diagnosis not present

## 2020-01-21 DIAGNOSIS — G4733 Obstructive sleep apnea (adult) (pediatric): Secondary | ICD-10-CM | POA: Diagnosis present

## 2020-01-21 DIAGNOSIS — I2510 Atherosclerotic heart disease of native coronary artery without angina pectoris: Secondary | ICD-10-CM | POA: Diagnosis present

## 2020-01-21 DIAGNOSIS — F17210 Nicotine dependence, cigarettes, uncomplicated: Secondary | ICD-10-CM | POA: Diagnosis present

## 2020-01-21 DIAGNOSIS — E785 Hyperlipidemia, unspecified: Secondary | ICD-10-CM | POA: Diagnosis present

## 2020-01-21 DIAGNOSIS — F32A Depression, unspecified: Secondary | ICD-10-CM | POA: Diagnosis present

## 2020-01-21 DIAGNOSIS — R519 Headache, unspecified: Secondary | ICD-10-CM | POA: Diagnosis present

## 2020-01-21 HISTORY — PX: TEE WITHOUT CARDIOVERSION: SHX5443

## 2020-01-21 LAB — ECHOCARDIOGRAM COMPLETE
AR max vel: 2.29 cm2
AV Area VTI: 2.19 cm2
AV Area mean vel: 2.08 cm2
AV Mean grad: 5 mmHg
AV Peak grad: 11 mmHg
Ao pk vel: 1.66 m/s
Area-P 1/2: 3.06 cm2
Height: 65 in
S' Lateral: 2.98 cm
Weight: 5470.94 oz

## 2020-01-21 LAB — CBC
HCT: 48.1 % — ABNORMAL HIGH (ref 36.0–46.0)
Hemoglobin: 15.9 g/dL — ABNORMAL HIGH (ref 12.0–15.0)
MCH: 28.4 pg (ref 26.0–34.0)
MCHC: 33.1 g/dL (ref 30.0–36.0)
MCV: 85.9 fL (ref 80.0–100.0)
Platelets: 255 10*3/uL (ref 150–400)
RBC: 5.6 MIL/uL — ABNORMAL HIGH (ref 3.87–5.11)
RDW: 13.2 % (ref 11.5–15.5)
WBC: 9.9 10*3/uL (ref 4.0–10.5)
nRBC: 0 % (ref 0.0–0.2)

## 2020-01-21 LAB — HEPARIN LEVEL (UNFRACTIONATED)
Heparin Unfractionated: <0.1 IU/mL — ABNORMAL LOW (ref 0.30–0.70)
Heparin Unfractionated: <0.1 IU/mL — ABNORMAL LOW (ref 0.30–0.70)
Heparin Unfractionated: <0.1 IU/mL — ABNORMAL LOW (ref 0.30–0.70)

## 2020-01-21 LAB — PROTEIN C ACTIVITY: Protein C Activity: 140 % (ref 73–180)

## 2020-01-21 LAB — BASIC METABOLIC PANEL
Anion gap: 8 (ref 5–15)
BUN: 10 mg/dL (ref 6–20)
CO2: 27 mmol/L (ref 22–32)
Calcium: 8.6 mg/dL — ABNORMAL LOW (ref 8.9–10.3)
Chloride: 101 mmol/L (ref 98–111)
Creatinine, Ser: 0.73 mg/dL (ref 0.44–1.00)
GFR, Estimated: >60 mL/min (ref >60)
Glucose, Bld: 253 mg/dL — ABNORMAL HIGH (ref 70–99)
Potassium: 4 mmol/L (ref 3.5–5.1)
Sodium: 136 mmol/L (ref 135–145)

## 2020-01-21 LAB — GLUCOSE, CAPILLARY
Glucose-Capillary: 239 mg/dL — ABNORMAL HIGH (ref 70–99)
Glucose-Capillary: 152 mg/dL — ABNORMAL HIGH (ref 70–99)
Glucose-Capillary: 205 mg/dL — ABNORMAL HIGH (ref 70–99)
Glucose-Capillary: 251 mg/dL — ABNORMAL HIGH (ref 70–99)

## 2020-01-21 LAB — HOMOCYSTEINE: Homocysteine: 12.4 umol/L (ref 0.0–14.5)

## 2020-01-21 LAB — CBG MONITORING, ED: Glucose-Capillary: 253 mg/dL — ABNORMAL HIGH (ref 70–99)

## 2020-01-21 LAB — LUPUS ANTICOAGULANT PANEL
DRVVT: 29.2 s (ref 0.0–47.0)
PTT Lupus Anticoagulant: 30.9 s (ref 0.0–51.9)

## 2020-01-21 LAB — PROTEIN S ACTIVITY: Protein S Activity: 93 % (ref 63–140)

## 2020-01-21 LAB — PROTEIN S, TOTAL: Protein S Ag, Total: 120 % (ref 60–150)

## 2020-01-21 LAB — BETA-2-GLYCOPROTEIN I ABS, IGG/M/A
Beta-2 Glyco I IgG: <9 GPI IgG units (ref 0–20)
Beta-2-Glycoprotein I IgA: <9 GPI IgA units (ref 0–25)
Beta-2-Glycoprotein I IgM: <9 GPI IgM units (ref 0–32)

## 2020-01-21 LAB — PROTEIN C, TOTAL: Protein C, Total: 118 % (ref 60–150)

## 2020-01-21 SURGERY — ECHOCARDIOGRAM, TRANSESOPHAGEAL
Anesthesia: Moderate Sedation

## 2020-01-21 MED ORDER — SODIUM CHLORIDE 0.9 % IV SOLN: INTRAVENOUS | Status: DC

## 2020-01-21 MED ORDER — INSULIN DETEMIR 100 UNIT/ML ~~LOC~~ SOLN
20.0000 [IU] | Freq: Every day | SUBCUTANEOUS | Status: DC
  Administered 2020-01-21 – 2020-01-22 (×2): 20 [IU] via SUBCUTANEOUS
  Filled 2020-01-21 (×2): qty 0.2

## 2020-01-21 MED ORDER — MIDAZOLAM HCL 5 MG/5ML IJ SOLN
INTRAMUSCULAR | Status: AC | PRN
  Administered 2020-01-21: 1 mg via INTRAVENOUS

## 2020-01-21 MED ORDER — LIDOCAINE VISCOUS HCL 2 % MT SOLN
OROMUCOSAL | Status: AC | PRN
  Administered 2020-01-21: 15 mL via OROMUCOSAL

## 2020-01-21 MED ORDER — FENTANYL CITRATE (PF) 100 MCG/2ML IJ SOLN
INTRAMUSCULAR | Status: AC | PRN
  Administered 2020-01-21 (×3): 25 ug via INTRAVENOUS

## 2020-01-21 MED ORDER — FENTANYL CITRATE (PF) 100 MCG/2ML IJ SOLN
INTRAMUSCULAR | Status: AC
  Filled 2020-01-21: qty 2

## 2020-01-21 MED ORDER — BUTAMBEN-TETRACAINE-BENZOCAINE 2-2-14 % EX AERO
INHALATION_SPRAY | CUTANEOUS | Status: AC
  Filled 2020-01-21: qty 5

## 2020-01-21 MED ORDER — HEPARIN (PORCINE) 25000 UT/250ML-% IV SOLN
2400.0000 [IU]/h | INTRAVENOUS | Status: DC
  Administered 2020-01-21 (×2): 1750 [IU]/h via INTRAVENOUS
  Administered 2020-01-22: 02:00:00 2200 [IU]/h via INTRAVENOUS
  Filled 2020-01-21 (×2): qty 250

## 2020-01-21 MED ORDER — MIDAZOLAM HCL 5 MG/5ML IJ SOLN
INTRAMUSCULAR | Status: AC
  Filled 2020-01-21: qty 5

## 2020-01-21 MED ORDER — HEPARIN BOLUS VIA INFUSION
1400.0000 [IU] | Freq: Once | INTRAVENOUS | Status: AC
  Administered 2020-01-21: 22:00:00 1400 [IU] via INTRAVENOUS
  Filled 2020-01-21: qty 1400

## 2020-01-21 MED ORDER — LIDOCAINE VISCOUS HCL 2 % MT SOLN
OROMUCOSAL | Status: AC
  Filled 2020-01-21: qty 15

## 2020-01-21 MED ORDER — SODIUM CHLORIDE FLUSH 0.9 % IV SOLN
INTRAVENOUS | Status: AC
  Filled 2020-01-21: qty 10

## 2020-01-21 MED ORDER — INSULIN STARTER KIT- PEN NEEDLES (ENGLISH)
1.0000 | Freq: Once | Status: DC
  Filled 2020-01-21: qty 1

## 2020-01-21 MED ORDER — MIDAZOLAM HCL 2 MG/2ML IJ SOLN
INTRAMUSCULAR | Status: AC | PRN
  Administered 2020-01-21: 1 mg via INTRAVENOUS

## 2020-01-21 NOTE — ED Notes (Addendum)
Pts oxygen noted to drop in the 80s and then would bounce back up to the 90s. Pt had one episode where her oxygen saturation dropped to 76% on room air. Pt placed on 2LPM via [place] and oxygen is now 96% on 2LPM via [place].

## 2020-01-21 NOTE — Consult Note (Signed)
ANTICOAGULATION CONSULT NOTE - Consult  Pharmacy Consult for Heparin gtt Indication: stroke  Allergies  Allergen Reactions  . Wellbutrin [Bupropion] Rash    Patient Measurements: Height: 5\' 5"  (165.1 cm) Weight: (!) 155.1 kg (341 lb 14.9 oz) IBW/kg (Calculated) : 57 Heparin Dosing Weight: 96.4 kg  Vital Signs: Temp: 98.3 F (36.8 C) (12/14 2014) BP: 163/71 (12/15 0400) Pulse Rate: 59 (12/15 0400)  Labs: Recent Labs    01/19/20 1444 01/20/20 2000 01/21/20 0247  HGB 16.9*  --  15.9*  HCT 50.7*  --  48.1*  PLT 299  --  255  APTT 28  --   --   LABPROT 12.8  --   --   INR 1.0  --   --   HEPARINUNFRC  --  <0.10* <0.10*  CREATININE 0.71  --  0.73    Estimated Creatinine Clearance: 126.3 mL/min (by C-G formula based on SCr of 0.73 mg/dL).   Medications:  No AC prior to arrival. Pt on 81mg  daily aspirin. No pertinent drug allergies.  Assessment: Heparin Dosing Weight: 96.4 kg Heparin CVA/TIA - low-rate; no bolus (reduced anti-Xa goal of 0.3-0.5)  Per Neuro note: "...persistent symptoms, ED with MRI brain showed multifocal acute ischemia in the Rt hemisphere c/f watershed appearing pattern a/w petechial hemorrhage Heidelberg 1B with no mass-effect.  MRV of the head was done was negative for dural venous sinus thrombosis..."  12/14 2000 HL < 0.1  12/15 0247 HL < 0.1   Goal of Therapy:  Heparin level 0.3-0.5 units/ml Monitor platelets by anticoagulation protocol: Yes   Plan:  Heparin level is subtherapeutic. Will increase heparin infusion to 1750 units/hr. No bolus due to stroke. Recheck heparin level in 6 hours. CBC daily while on heparin.   Renda Rolls, PharmD, [facility] 01/21/2020 4:39 AM

## 2020-01-21 NOTE — Consult Note (Signed)
ANTICOAGULATION CONSULT NOTE - Consult  Pharmacy Consult for Heparin gtt Indication: stroke  Allergies  Allergen Reactions   Wellbutrin [Bupropion] Rash    Patient Measurements: Height: 5\' 5"  (165.1 cm) Weight: 128.8 kg (283 lb 14.4 oz) IBW/kg (Calculated) : 57 Heparin Dosing Weight: 96.4 kg  Vital Signs: Temp: 98.3 F (36.8 C) (12/15 1058) Temp Source: Oral (12/15 1058) BP: 153/82 (12/15 1058) Pulse Rate: 76 (12/15 1058)  Labs: Recent Labs    01/19/20 1444 01/20/20 2000 01/21/20 0247 01/21/20 1154  HGB 16.9*  --  15.9*  --   HCT 50.7*  --  48.1*  --   PLT 299  --  255  --   APTT 28  --   --   --   LABPROT 12.8  --   --   --   INR 1.0  --   --   --   HEPARINUNFRC  --  <0.10* <0.10* <0.10*  CREATININE 0.71  --  0.73  --     Estimated Creatinine Clearance: 112.6 mL/min (by C-G formula based on SCr of 0.73 mg/dL).   Medications:  No AC prior to arrival. Pt on 81mg  daily aspirin. No pertinent drug allergies.  Assessment: 49 year old female presenting with confusion x 3 days. PMH includes DM with neuropathy, HTN, morbid obesity, arthritis, OSA, CAD, and depression. CBC stable.   Per Neuro note: "...persistent symptoms, ED with MRI brain showed multifocal acute ischemia in the Rt hemisphere c/f watershed appearing pattern a/w petechial hemorrhage Heidelberg 1B with no mass-effect.  MRV of the head was done was negative for dural venous sinus thrombosis..."  Heparin CVA/TIA - low-rate; no bolus (reduced anti-Xa goal of 0.3-0.5)  12/14 2000 HL < 0.1  12/15 0247 HL < 0.1 12/16 1154 HL < 0.1  Goal of Therapy:  Heparin level 0.3-0.5 units/ml Monitor platelets by anticoagulation protocol: Yes   Plan:  - Heparin level is subtherapeutic. Will increase heparin infusion to 1950 units/hr. No bolus due to stroke.  - Recheck heparin level in 6 hours (reduced anti-Xa goal of 0.3-0.5) - CBC daily while on heparin.   Benn Moulder, PharmD Pharmacy Resident   01/21/2020 1:22 PM

## 2020-01-21 NOTE — Progress Notes (Signed)
Inpatient Diabetes Program Recommendations  AACE/ADA: New Consensus Statement on Inpatient Glycemic Control   Target Ranges:  Prepandial:   less than 140 mg/dL      Peak postprandial:   less than 180 mg/dL (1-2 hours)      Critically ill patients:  140 - 180 mg/dL    Results for Joanna [name], Joanna [name] (MRN [id number]) as of 01/21/2020 12:18  Ref. Range 01/20/2020 04:35  Hemoglobin A1C Latest Ref Range: 4.8 - 5.6 % 13.7 (H)   Review of Glycemic Control  Diabetes history: DM2 Outpatient Diabetes medications: No DM medications in over a year; reports she was taking Glipizide, Metformin and Victoza in the past Current orders for Inpatient glycemic control: Levemir 20 units daily, Novolog 0-15 units TID with meals, Novolog 0-5 units QHS  NOTE: Spoke with patient about diabetes and home regimen for diabetes control. Patient reports that she dose not have a PCP and notes that she has moved a lot over the past year (from [place], to [place], to [place]) and she has not yet looked for a PCP. Patient confirms that she has Medicaid insurance.  Patient reports that she has not taken any DM medications in over a year and when she last took medications for DM she took Metformin 1000 mg BID, Glipizide, and Victoza. Patient reports that when she was taking DM medications as prescribed she was told that her DM was fairly well controlled based on labs done every 3 months.  Discussed A1C results (13.7% on 01/20/20 ) and explained that current A1C indicates an average glucose of 346 mg/dl over the past 2-3 months. Discussed glucose and A1C goals. Discussed importance of checking CBGs and maintaining good CBG control to prevent long-term and short-term complications. Explained how hyperglycemia leads to damage within blood vessels which lead to the common complications seen with uncontrolled diabetes. Stressed to the patient the importance of improving glycemic control to prevent further complications from  uncontrolled diabetes. Patient reports that she has an old glucometer but no testing supplies. Informed patient it would be requested that provider at discharge give her Rx for glucose monitoring kit. Patient states that she has never taken insulin in the past but she would be willing to take insulin if needed to get DM under control. Will consult TOC to see if they can assist with arranging follow up or providing a list of providers taking new patients that accept Medicaid insurance. Also encouraged patient to call Medicaid case worker to see if they can provide a list of providers that take Medicaid that she could call and get an appointment with. Reviewed and demonstrated how to use an insulin pen. Patient was able to successfully demonstrate how to use an insulin pen.  Patient verbalized understanding of information discussed and reports no further questions at this time related to diabetes.  Thanks, Barnie Alderman, RN, MSN, CDE Diabetes Coordinator Inpatient Diabetes Program [phone] (Team Pager)

## 2020-01-21 NOTE — Progress Notes (Signed)
Neurology Progress Note   S:// Seen and examined. On heaprin gtt since yesterday for carotid thrombus.  O:// Current vital signs: BP (!) 153/82 (BP Location: Left Arm)   Pulse 76   Temp 98.3 F (36.8 C) (Oral)   Resp 18   Ht _0  (1.651 m)   Wt 128.8 kg   SpO2 95%   BMI 47.24 kg/m  Vital signs in last 24 hours: Temp:  [98.1 F (36.7 C)-98.5 F (36.9 C)] 98.3 F (36.8 C) (12/15 1058) Pulse Rate:  [59-83] 76 (12/15 1058) Resp:  [13-23] 18 (12/15 0624) BP: (118-163)/(63-93) 153/82 (12/15 1058) SpO2:  [91 %-100 %] 95 % (12/15 1058) Weight:  [128.8 kg] 128.8 kg (12/15 1058) General: Awake alert in no distress HEENT: Normocephalic atraumatic with hirsutism CVS: Regular rhythm Abdomen obese nontender Extremities warm well perfused with trace edema Chest clear to auscultation Neurological exam Awake alert oriented x3 No aphasia No dysarthria Cranial nerves: Pupils equal round reactive light, extraocular movements intact, visual field examination reveals a left lower quadrantanopsia, face is symmetric and facial sensation is intact, tongue and palate are midline. Motor examination reveals no drift in any of the 4 extremities but individual muscle testing shows slight weakness 4+/5 in the left upper extremity as well as the left lower extremity.  She also has difficulty performing rapid finger taps on the left hand.  Right upper and lower extremity are full strength. Sensation: Intact to light touch without extinction. Coordination: No dysmetria although she is slow to perform finger-nose-finger testing on the left. NIH stroke scale 1a Level of Conscious.: 0 1b LOC Questions: 0 1c LOC Commands: 0 2 Best Gaze: 0 3 Visual: 1 4 Facial Palsy: 0 5a Motor Arm - left: 0 5b Motor Arm - Right: 0 6a Motor Leg - Left: 0 6b Motor Leg - Right: 0 7 Limb Ataxia: 0 8 Sensory: 0 9 Best Language: 0 10 Dysarthria: 0 11 Extinct. and Inatten.: 0 TOTAL: 1  Medications  Current  Facility-Administered Medications:  .  butamben-tetracaine-benzocaine (CETACAINE) 03-10-12 % spray, , , ,  .  fentaNYL (SUBLIMAZE) 100 MCG/2ML injection, , , ,  .  lidocaine (XYLOCAINE) 2 % viscous mouth solution, , , ,  .  midazolam (VERSED) 5 MG/5ML injection, , , ,  .  sodium chloride flush 0.9 % injection, , , ,  .  [MAR Hold]  stroke: mapping our early stages of recovery book, , Does not apply, Once, Judd Gaudier V, MD .  0.9 %  sodium chloride infusion, , Intravenous, Continuous, Dunn, Ryan M, PA-C .  [MAR Hold] acetaminophen (TYLENOL) tablet 650 mg, 650 mg, Oral, Q4H PRN **OR** [MAR Hold] acetaminophen (TYLENOL) 160 MG/5ML solution 650 mg, 650 mg, Per Tube, Q4H PRN **OR** [MAR Hold] acetaminophen (TYLENOL) suppository 650 mg, 650 mg, Rectal, Q4H PRN, Athena Masse, MD .  Doug Sou Hold] atorvastatin (LIPITOR) tablet 80 mg, 80 mg, Oral, Daily, Amie Portland, MD, 80 mg at 01/21/20 0943 .  heparin ADULT infusion 100 units/mL (25000 units/279m sodium chloride 0.45%), 1,950 Units/hr, Intravenous, Continuous, CBenn Moulder RPH, Last Rate: 19.5 mL/hr at 01/21/20 1406, 1,950 Units/hr at 01/21/20 1406 .  [MAR Hold] insulin aspart (novoLOG) injection 0-15 Units, 0-15 Units, Subcutaneous, TID WC, DAthena Masse MD, 5 Units at 01/21/20 1200 .  [MAR Hold] insulin aspart (novoLOG) injection 0-5 Units, 0-5 Units, Subcutaneous, QHS, DAthena Masse MD, 3 Units at 01/20/20 2330 .  [MAR Hold] insulin detemir (LEVEMIR) injection 20 Units, 20 Units,  Subcutaneous, Daily, Wyvonnia Dusky, MD .  Doug Sou Hold] insulin starter kit- pen needles (English) 1 kit, 1 kit, Other, Once, Wyvonnia Dusky, MD  Labs CBC    Component Value Date/Time   WBC 9.9 01/21/2020 0247   RBC 5.60 (H) 01/21/2020 0247   HGB 15.9 (H) 01/21/2020 0247   HCT 48.1 (H) 01/21/2020 0247   PLT 255 01/21/2020 0247   MCV 85.9 01/21/2020 0247   MCH 28.4 01/21/2020 0247   MCHC 33.1 01/21/2020 0247   RDW 13.2 01/21/2020 0247    LYMPHSABS 3.5 01/19/2020 1444   MONOABS 0.8 01/19/2020 1444   EOSABS 0.1 01/19/2020 1444   BASOSABS 0.1 01/19/2020 1444   CMP     Component Value Date/Time   NA 136 01/21/2020 0247   K 4.0 01/21/2020 0247   CL 101 01/21/2020 0247   CO2 27 01/21/2020 0247   GLUCOSE 253 (H) 01/21/2020 0247   BUN 10 01/21/2020 0247   CREATININE 0.73 01/21/2020 0247   CREATININE 0.89 10/30/2018 1016   CALCIUM 8.6 (L) 01/21/2020 0247   PROT 7.9 01/19/2020 1444   ALBUMIN 3.4 (L) 01/19/2020 1444   AST 25 01/19/2020 1444   ALT 26 01/19/2020 1444   ALKPHOS 109 01/19/2020 1444   BILITOT 0.7 01/19/2020 1444   GFRNONAA >60 01/21/2020 0247   GFRNONAA 76 10/30/2018 1016   GFRAA 88 10/30/2018 1016    glycosylated hemoglobin-13.7  Lipid Panel     Component Value Date/Time   CHOL 190 01/20/2020 0435   TRIG 372 (H) 01/20/2020 0435   HDL 23 (L) 01/20/2020 0435   CHOLHDL 8.3 01/20/2020 0435   VLDL 74 (H) 01/20/2020 0435   LDLCALC 93 01/20/2020 0435   Hypercoagulable work-up thus far  Antithrombin activity 96-normal Lupus anticoagulant negative-normal Protein S functional 93-normal Protein S total 120-normal  Transthoracic echocardiogram IMPRESSIONS  1. Left ventricular ejection fraction, by estimation, is 55 to 60%. The  left ventricle has normal function. The left ventricle has no regional  wall motion abnormalities. Left ventricular diastolic parameters are  consistent with Grade I diastolic  dysfunction (impaired relaxation).  2. Right ventricular systolic function is normal. The right ventricular  size is normal.  3. The mitral valve is grossly normal. No evidence of mitral valve  regurgitation.  4. The aortic valve was not well visualized. Aortic valve regurgitation  is not visualized.  5. The inferior vena cava is normal in size with greater than 50%  respiratory variability, suggesting right atrial pressure of 3 mmHg.   Imaging I have reviewed images in epic and the results  pertinent to this consultation are: MRI examination of the brain-right hemispheric acute ischemic strokes and watershed pattern petechial hemorrhage. MRV negative for thrombosis. Dopplers with 50 to 69% right carotid stenosis.  Left carotid less than 50% stenosis.  Vertebral arteries antegrade bilaterally.  CTA of the head and neck completed- Abnormal scan There is a rounded focal filling defect within the distal right common carotid artery extending into the proximal right internal carotid artery concerning for intraluminal thrombus. No LVO. Partially visualized opacities in the lung apices which could represent mild pulmonary edema or infection.  Lower extremity Dopplers negative for DVT.  Deep calf veins could not be evaluated due to body habitus and edema  Assessment:  49 year old woman with past medical history of diabetes, obesity, PCOS, sleep apnea, hypertension, hyperlipidemia, anxiety/depression presented for evaluation of headache and changes with her vision as well as balance with last known normal multiple days prior  to presentation-not a candidate for TPA or intervention noted to have late acute early subacute strokes in the right cerebral hemisphere in a watershed distribution.  Carotid Dopplers showed 50 to 69% right carotid stenosis but the CTA that was done in follow-up showed a thrombus extending from the right common carotid into the right internal carotid.  She was started on heparin drip stroke protocol yesterday. Examination continues to be stable with no worsening. Pending TEE today-TTE was unremarkable. Hypercoagulable work-up underway and thus far has been unremarkable.  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 -Blood sugar management per primary team-hemoglobin A1c greater than 13. -Continue heparin drip for another day, head CT  tomorrow.  If no hemorrhagic transformation or worsening of the already existing hemorrhagic transformation, can switch anticoagulation to Eliquis. -Pending TEE today. -PT OT speech therapy -Blood pressure goal less than 160 given that she is on heparin drip.  We will continue to follow. Plan relayed via secure text to the primary hospitalist Dr. Jimmye Norman.  -- Amie Portland, MD [facility] Pager: [phone] If 7pm to 7am, please call on call as listed on AMION.

## 2020-01-21 NOTE — Progress Notes (Signed)
    [facility] has been requested to perform a transesophageal echocardiogram on Jeris Easterly for stroke.  After careful review of history and examination, the risks and benefits of transesophageal echocardiogram have been explained including risks of esophageal damage, perforation (1:10,000 risk), bleeding, pharyngeal hematoma as well as other potential complications associated with conscious sedation including aspiration, arrhythmia, respiratory failure and death. Alternatives to treatment were discussed, questions were answered. Patient is willing to proceed.    2D echo 01/21/2020: 1. Left ventricular ejection fraction, by estimation, is 55 to 60%. The  left ventricle has normal function. The left ventricle has no regional  wall motion abnormalities. Left ventricular diastolic parameters are  consistent with Grade I diastolic  dysfunction (impaired relaxation).  2. Right ventricular systolic function is normal. The right ventricular  size is normal.  3. The mitral valve is grossly normal. No evidence of mitral valve  regurgitation.  4. The aortic valve was not well visualized. Aortic valve regurgitation  is not visualized.  5. The inferior vena cava is normal in size with greater than 50%  respiratory variability, suggesting right atrial pressure of 3 mmHg.  Christell Faith, PA-C 01/21/2020 1:24 PM

## 2020-01-21 NOTE — Consult Note (Signed)
ANTICOAGULATION CONSULT NOTE - Consult  Pharmacy Consult for Heparin gtt Indication: stroke  Allergies  Allergen Reactions  . Wellbutrin [Bupropion] Rash    Patient Measurements: Height: 5\' 5"  (627.0 cm) Weight: 128.8 kg (283 lb 14.4 oz) IBW/kg (Calculated) : 57 Heparin Dosing Weight: 96.4 kg  Vital Signs: Temp: 98.3 F (36.8 C) (12/15 1058) Temp Source: Oral (12/15 1058) BP: 153/82 (12/15 1058) Pulse Rate: 76 (12/15 1058)  Labs: Recent Labs    01/19/20 1444 01/20/20 2000 01/21/20 0247 01/21/20 1154  HGB 16.9*  --  15.9*  --   HCT 50.7*  --  48.1*  --   PLT 299  --  255  --   APTT 28  --   --   --   LABPROT 12.8  --   --   --   INR 1.0  --   --   --   HEPARINUNFRC  --  <0.10* <0.10* <0.10*  CREATININE 0.71  --  0.73  --     Estimated Creatinine Clearance: 112.6 mL/min (by C-G formula based on SCr of 0.73 mg/dL).   Medications:  No AC prior to arrival. Pt on 81mg  daily aspirin. No pertinent drug allergies.  Assessment: Heparin Dosing Weight: 96.4 kg Heparin CVA/TIA - low-rate; no bolus (reduced anti-Xa goal of 0.3-0.5)  Per Neuro note: "...persistent symptoms, ED with MRI brain showed multifocal acute ischemia in the Rt hemisphere c/f watershed appearing pattern a/w petechial hemorrhage Heidelberg 1B with no mass-effect.  MRV of the head was done was negative for dural venous sinus thrombosis..."  Plts: 299>255; Hgb: 16.9>15.9  Date Time HL 12/14 2000 < 0.1  12/15 0247 < 0.1 12/15 1154  <0.1  Goal of Therapy:  Heparin level 0.3-0.5 units/ml Monitor platelets by anticoagulation protocol: Yes   Plan:  Heparin level is subtherapeutic. No bolus due to stroke. Will increase heparin infusion rate to 1950 units/hr. (~2u/k/h increase)  Recheck heparin level in 6 hours.  CBC daily while on heparin.   Lorna Dibble, [facility] 01/21/2020 1:08 PM

## 2020-01-21 NOTE — Plan of Care (Signed)
  Problem: Education: Goal: Knowledge of secondary prevention will improve Outcome: Progressing   Problem: Education: Goal: Knowledge of disease or condition will improve Outcome: Progressing   Problem: Education: Goal: Individualized Educational Video(s) Outcome: Progressing

## 2020-01-21 NOTE — Consult Note (Addendum)
ANTICOAGULATION CONSULT NOTE - Consult  Pharmacy Consult for Heparin gtt Indication: stroke  Allergies  Allergen Reactions  . Wellbutrin [Bupropion] Rash    Patient Measurements: Height: 5\' 5"  (165.1 cm) Weight: 128.8 kg (283 lb 15.2 oz) IBW/kg (Calculated) : 57 Heparin Dosing Weight: 96.4 kg  Vital Signs: Temp: 97.7 F (36.5 C) (12/15 2001) Temp Source: Oral (12/15 2001) BP: 127/64 (12/15 2001) Pulse Rate: 81 (12/15 2001)  Labs: Recent Labs    01/19/20 1444 01/20/20 2000 01/21/20 0247 01/21/20 1154 01/21/20 1950  HGB 16.9*  --  15.9*  --   --   HCT 50.7*  --  48.1*  --   --   PLT 299  --  255  --   --   APTT 28  --   --   --   --   LABPROT 12.8  --   --   --   --   INR 1.0  --   --   --   --   HEPARINUNFRC  --    < > <0.10* <0.10* <0.10*  CREATININE 0.71  --  0.73  --   --    < > = values in this interval not displayed.    Estimated Creatinine Clearance: 112.6 mL/min (by C-G formula based on SCr of 0.73 mg/dL).   Medications:  No AC prior to arrival. Pt on 81mg  daily aspirin. No pertinent drug allergies.  Assessment: 49 year old female presenting with confusion x 3 days. PMH includes DM with neuropathy, HTN, morbid obesity, arthritis, OSA, CAD, and depression. CBC stable.   Per Neuro note: "...persistent symptoms, ED with MRI brain showed multifocal acute ischemia in the Rt hemisphere c/f watershed appearing pattern a/w petechial hemorrhage Heidelberg 1B with no mass-effect.  MRV of the head was done was negative for dural venous sinus thrombosis..."  Heparin CVA/TIA - low-rate; no bolus (reduced anti-Xa goal of 0.3-0.5)  12/14 2000 HL < 0.1  12/15 0247 HL < 0.1 12/15 1154 HL < 0.1 12/15 1950 HL < 0.1  Goal of Therapy:  Heparin level 0.3-0.5 units/ml Monitor platelets by anticoagulation protocol: Yes   Plan:  - Heparin level is subtherapeutic. Will give a low dose bolus of 1400 units. Followed by increase heparin infusion to 2200 units/hr.  - Recheck  heparin level in 6 hours (reduced anti-Xa goal of 0.3-0.5) - CBC daily while on heparin.   Oswald Hillock, PharmD, BCPS 01/21/2020 9:09 PM

## 2020-01-21 NOTE — Progress Notes (Addendum)
PROGRESS NOTE    Joanna [name]  YNW:[id number] DOB: 06/04/1970 DOA: 01/19/2020 PCP: Pcp, No    Assessment & Plan:   Active Problems:   Hypertension   Morbid obesity with BMI of 50.0-59.9, adult (HCC)   CAD (coronary artery disease)   OSA (obstructive sleep apnea)   Cigarette nicotine dependence without complication   Type 2 diabetes mellitus with hyperlipidemia (HCC)   Acute CVA (cerebrovascular accident) ([place])   CVA (cerebral vascular accident) ([place])  Acute CVA: MRI brain showing multifocal acute ischemia right hemisphere watershed distribution with associated petechial hemorrhage. Allow permissive HTN. Continue on statin. Echo shows EF of 65-78%, grade I diastolic dysfunction, no regional wall motion abnormalities & no atrial level shunt detected. TEE today as per cardio. Carotid US shows 50-69% stenosis of right carotid artery system & less than 50% stenosis of left carotid artery system. Neuro following and recs apprec  HTN: hold anti-HTN meds to allow permissive HTN   Morbid obesity: BMI 56.9. Complicates overall care and prognosis   CAD: continue on metoprolol, statin. Aspirin on hold due to petechial hemorrhages on MRI  OSA: continue on BiPAP  Cigarette nicotine dependence: smoking cessation counseling   DM2: poorly controlled. Continue on detemir, SSI w/ accuchecks   Depression: severity unknown. Continue on venlafaxine      DVT prophylaxis: SCDs Code Status: full  Family Communication:  Disposition Plan: depends on PT/OT recs  Status is: Inpatient  Remains inpatient appropriate because:Ongoing diagnostic testing needed not appropriate for outpatient work up, Unsafe d/c plan and IV treatments appropriate due to intensity of illness or inability to take PO   Dispo: The patient is from: Home              Anticipated d/c is to: Home              Anticipated d/c date is: 3 days              Patient currently is not medically stable to  d/c.         Consultants:   Neuro  Cardio    Procedures:    Antimicrobials:    Subjective: Pt c/o malaise  Objective: Vitals:   01/21/20 0523 01/21/20 0600 01/21/20 0624 01/21/20 0700  BP: (!) 153/68 (!) 156/63 (!) 156/63 (!) 152/93  Pulse: 69 74 74 74  Resp: 18  18   Temp:    98.1 F (36.7 C)  TempSrc:    Oral  SpO2: 96% 91% 91% 97%  Weight:      Height:       No intake or output data in the 24 hours ending 01/21/20 0843 Filed Weights   01/19/20 1440  Weight: (!) 155.1 kg    Examination:  General exam: Appears calm and comfortable. Morbid obesity. Increased facial hair growth Respiratory system: Clear to auscultation. Respiratory effort normal. Cardiovascular system: S1 & S2 + No rubs, gallops or clicks.  Gastrointestinal system: Abdomen is obese, soft and nontender. Normal bowel sounds heard. Central nervous system: Alert and oriented. Moves all 4 extremities  Psychiatry: Judgement and insight appear normal. Flat mood and affect     Data Reviewed: I have personally reviewed following labs and imaging studies  CBC: Recent Labs  Lab 01/19/20 1444 01/21/20 0247  WBC 15.0* 9.9  NEUTROABS 10.4*  --   HGB 16.9* 15.9*  HCT 50.7* 48.1*  MCV 84.5 85.9  PLT 299 469   Basic Metabolic Panel: Recent Labs  Lab 01/19/20 1444 01/21/20 0247  NA 135 136  K 4.1 4.0  CL 96* 101  CO2 28 27  GLUCOSE 301* 253*  BUN 6 10  CREATININE 0.71 0.73  CALCIUM 9.3 8.6*   GFR: Estimated Creatinine Clearance: 126.3 mL/min (by C-G formula based on SCr of 0.73 mg/dL). Liver Function Tests: Recent Labs  Lab 01/19/20 1444  AST 25  ALT 26  ALKPHOS 109  BILITOT 0.7  PROT 7.9  ALBUMIN 3.4*   No results for input(s): LIPASE, AMYLASE in the last 168 hours. No results for input(s): AMMONIA in the last 168 hours. Coagulation Profile: Recent Labs  Lab 01/19/20 1444  INR 1.0   Cardiac Enzymes: No results for input(s): CKTOTAL, CKMB, CKMBINDEX, TROPONINI in  the last 168 hours. BNP (last 3 results) No results for input(s): PROBNP in the last 8760 hours. HbA1C: Recent Labs    01/20/20 0435  HGBA1C 13.7*   CBG: Recent Labs  Lab 01/20/20 0902 01/20/20 1220 01/20/20 1737 01/20/20 2319 01/21/20 0820  GLUCAP 259* 287* 268* 270* 253*   Lipid Profile: Recent Labs    01/20/20 0435  CHOL 190  HDL 23*  LDLCALC 93  TRIG 372*  CHOLHDL 8.3   Thyroid Function Tests: No results for input(s): TSH, T4TOTAL, FREET4, T3FREE, THYROIDAB in the last 72 hours. Anemia Panel: No results for input(s): VITAMINB12, FOLATE, FERRITIN, TIBC, IRON, RETICCTPCT in the last 72 hours. Sepsis Labs: No results for input(s): PROCALCITON, LATICACIDVEN in the last 168 hours.  Recent Results (from the past 240 hour(s))  Resp Panel by RT-PCR (Flu A&B, Covid) Nasopharyngeal Swab     Status: None   Collection Time: 01/20/20 11:30 AM   Specimen: Nasopharyngeal Swab; Nasopharyngeal(NP) swabs in vial transport medium  Result Value Ref Range Status   SARS Coronavirus 2 by RT PCR NEGATIVE NEGATIVE Final    Comment: (NOTE) SARS-CoV-2 target nucleic acids are NOT DETECTED.  The SARS-CoV-2 RNA is generally detectable in upper respiratory specimens during the acute phase of infection. The lowest concentration of SARS-CoV-2 viral copies this assay can detect is 138 copies/mL. A negative result does not preclude SARS-Cov-2 infection and should not be used as the sole basis for treatment or other patient management decisions. A negative result may occur with  improper specimen collection/handling, submission of specimen other than nasopharyngeal swab, presence of viral mutation(s) within the areas targeted by this assay, and inadequate number of viral copies(<138 copies/mL). A negative result must be combined with clinical observations, patient history, and epidemiological information. The expected result is Negative.  Fact Sheet for Patients:   EntrepreneurPulse.com.au  Fact Sheet for Healthcare Providers:  IncredibleEmployment.be  This test is no t yet approved or cleared by the [place] FDA and  has been authorized for detection and/or diagnosis of SARS-CoV-2 by FDA under an Emergency Use Authorization (EUA). This EUA will remain  in effect (meaning this test can be used) for the duration of the COVID-19 declaration under Section 564(b)(1) of the Act, 21 U.S.C.section 360bbb-3(b)(1), unless the authorization is terminated  or revoked sooner.       Influenza A by PCR NEGATIVE NEGATIVE Final   Influenza B by PCR NEGATIVE NEGATIVE Final    Comment: (NOTE) The Xpert Xpress SARS-CoV-2/FLU/RSV plus assay is intended as an aid in the diagnosis of influenza from Nasopharyngeal swab specimens and should not be used as a sole basis for treatment. Nasal washings and aspirates are unacceptable for Xpert Xpress SARS-CoV-2/FLU/RSV testing.  Fact Sheet for Patients: EntrepreneurPulse.com.au  Fact Sheet for Healthcare Providers:  IncredibleEmployment.be  This test is not yet approved or cleared by the [place] and has been authorized for detection and/or diagnosis of SARS-CoV-2 by FDA under an Emergency Use Authorization (EUA). This EUA will remain in effect (meaning this test can be used) for the duration of the COVID-19 declaration under Section 564(b)(1) of the Act, 21 U.S.C. section 360bbb-3(b)(1), unless the authorization is terminated or revoked.  Performed at [facility], [address] [place], [place] [address]          Radiology Studies: CT ANGIO HEAD W OR WO CONTRAST  Result Date: 01/20/2020 CLINICAL DATA:  Neuro deficit, acute stroke suspected. EXAM: CT ANGIOGRAPHY HEAD AND NECK TECHNIQUE: Multidetector CT imaging of the head and neck was performed using the standard protocol during bolus administration of  intravenous contrast. Multiplanar CT image reconstructions and MIPs were obtained to evaluate the vascular anatomy. Carotid stenosis measurements (when applicable) are obtained utilizing NASCET criteria, using the distal internal carotid diameter as the denominator. CONTRAST:  76mL OMNIPAQUE IOHEXOL 350 MG/ML SOLN COMPARISON:  MRI January 20, 2020. FINDINGS: CT HEAD FINDINGS Brain: Multifocal infarcts were better characterized on recent MRI. Mild associated edema in the right occipital lobe without substantial mass effect. No acute hemorrhage. No hydrocephalus. No mass lesion or abnormal mass effect. Cavum septum pellucidum et vergae, anatomic variant. Vascular: Calcific atherosclerosis of the carotid arteries. Skull: No acute fracture. Sinuses: Visualized sinuses are clear. Orbits: No acute findings. Review of the MIP images confirms the above findings CTA NECK FINDINGS Aortic arch: Great vessel origins are patent. Right carotid system: Rounded focal filling defect within the distal common carotid artery and the proximal internal carotid artery, concerning for intraluminal thrombus. Additional calcific and noncalcific atherosclerosis at the bifurcation involving the proximal internal carotid artery. The degree of underlying stenosis is difficult to quantify given intraluminal thrombus. Left carotid system: No evidence of dissection, stenosis (50% or greater) or occlusion. Mixed calcific and noncalcific atherosclerosis at the bifurcation. Vertebral arteries: Codominant. No evidence of dissection, stenosis (50% or greater) or occlusion. Skeleton: Acute findings. Other neck: No mass or suspicious adenopathy. Upper chest: Partially imaged ground-glass opacities in the lung apices. Scattered calcific granulomas. Review of the MIP images confirms the above findings CTA HEAD FINDINGS Anterior circulation: No significant stenosis, proximal occlusion, aneurysm, or vascular malformation. Predominately calcific  atherosclerosis of bilateral cavernous and paraclinoid ICAs. Posterior circulation: No significant stenosis, proximal occlusion, aneurysm, or vascular malformation. Venous sinuses: As permitted by contrast timing, patent. Review of the MIP images confirms the above findings IMPRESSION: 1. Rounded focal filling defect within the distal right common carotid artery extending into the proximal internal carotid artery, concerning for intraluminal thrombus. 2. No large vessel occlusion. 3. Please see recent MRI for better characterization of known infarcts. No mass effect or acute hemorrhage. 4. Partially imaged ground-glass opacities in the lung apices, which could represent mild pulmonary edema or infection. Critical findings were discussed with Dr. Rory Percy via telephone at 12:25 p.m. Electronically Signed   By: Margaretha Sheffield MD   On: 01/20/2020 12:37   DG Chest 2 View  Result Date: 01/20/2020 CLINICAL DATA:  Encephalopathy EXAM: CHEST - 2 VIEW COMPARISON:  09/26/2017 FINDINGS: Bilateral interstitial opacities, unchanged. No focal consolidation. Normal cardiomediastinal contours and pleural spaces. IMPRESSION: Unchanged bilateral chronic interstitial opacities. Electronically Signed   By: Ulyses Jarred M.D.   On: 01/20/2020 02:38   CT HEAD WO CONTRAST  Result Date: 01/19/2020 CLINICAL DATA:  Neuro deficit, acute, stroke suspected. Additional  history provided: Patient reports headache, double vision, confusion, symptoms for 3 days. EXAM: CT HEAD WITHOUT CONTRAST TECHNIQUE: Contiguous axial images were obtained from the base of the skull through the vertex without intravenous contrast. COMPARISON:  No pertinent prior exams available for comparison. FINDINGS: Brain: Cerebral volume is normal for age. There is no acute intracranial hemorrhage. No demarcated cortical infarct. No extra-axial fluid collection. No evidence of intracranial mass. No midline shift. Partially empty sella turcica. Incidentally noted  cavum septum pellucidum. Vascular: No hyperdense vessel.  Atherosclerotic calcifications. Skull: Normal. Negative for fracture or focal lesion. Sinuses/Orbits: Visualized orbits show no acute finding. No significant paranasal sinus disease at the imaged levels. IMPRESSION: No evidence of acute intracranial abnormality. Partially empty sella turcica. This finding is very commonly incidental, but can be associated with idiopathic intracranial hypertension. Electronically Signed   By: Kellie Simmering DO   On: 01/19/2020 15:10   CT ANGIO NECK W OR WO CONTRAST  Result Date: 01/20/2020 CLINICAL DATA:  Neuro deficit, acute stroke suspected. EXAM: CT ANGIOGRAPHY HEAD AND NECK TECHNIQUE: Multidetector CT imaging of the head and neck was performed using the standard protocol during bolus administration of intravenous contrast. Multiplanar CT image reconstructions and MIPs were obtained to evaluate the vascular anatomy. Carotid stenosis measurements (when applicable) are obtained utilizing NASCET criteria, using the distal internal carotid diameter as the denominator. CONTRAST:  30mL OMNIPAQUE IOHEXOL 350 MG/ML SOLN COMPARISON:  MRI January 20, 2020. FINDINGS: CT HEAD FINDINGS Brain: Multifocal infarcts were better characterized on recent MRI. Mild associated edema in the right occipital lobe without substantial mass effect. No acute hemorrhage. No hydrocephalus. No mass lesion or abnormal mass effect. Cavum septum pellucidum et vergae, anatomic variant. Vascular: Calcific atherosclerosis of the carotid arteries. Skull: No acute fracture. Sinuses: Visualized sinuses are clear. Orbits: No acute findings. Review of the MIP images confirms the above findings CTA NECK FINDINGS Aortic arch: Great vessel origins are patent. Right carotid system: Rounded focal filling defect within the distal common carotid artery and the proximal internal carotid artery, concerning for intraluminal thrombus. Additional calcific and noncalcific  atherosclerosis at the bifurcation involving the proximal internal carotid artery. The degree of underlying stenosis is difficult to quantify given intraluminal thrombus. Left carotid system: No evidence of dissection, stenosis (50% or greater) or occlusion. Mixed calcific and noncalcific atherosclerosis at the bifurcation. Vertebral arteries: Codominant. No evidence of dissection, stenosis (50% or greater) or occlusion. Skeleton: Acute findings. Other neck: No mass or suspicious adenopathy. Upper chest: Partially imaged ground-glass opacities in the lung apices. Scattered calcific granulomas. Review of the MIP images confirms the above findings CTA HEAD FINDINGS Anterior circulation: No significant stenosis, proximal occlusion, aneurysm, or vascular malformation. Predominately calcific atherosclerosis of bilateral cavernous and paraclinoid ICAs. Posterior circulation: No significant stenosis, proximal occlusion, aneurysm, or vascular malformation. Venous sinuses: As permitted by contrast timing, patent. Review of the MIP images confirms the above findings IMPRESSION: 1. Rounded focal filling defect within the distal right common carotid artery extending into the proximal internal carotid artery, concerning for intraluminal thrombus. 2. No large vessel occlusion. 3. Please see recent MRI for better characterization of known infarcts. No mass effect or acute hemorrhage. 4. Partially imaged ground-glass opacities in the lung apices, which could represent mild pulmonary edema or infection. Critical findings were discussed with Dr. Rory Percy via telephone at 12:25 p.m. Electronically Signed   By: Margaretha Sheffield MD   On: 01/20/2020 12:37   MR BRAIN WO CONTRAST  Result Date: 01/20/2020 CLINICAL DATA:  headache and vision change EXAM: MRI HEAD WITHOUT CONTRAST MRV HEAD WITHOUT AND WITH CONTRAST TECHNIQUE: Multiplanar, multiecho pulse sequences of the brain and surrounding structures were obtained without intravenous  contrast. Angiographic images of the intracranial venous structures were obtained using MRV technique without and with intravenous contrast. COMPARISON:  None. FINDINGS: MRI HEAD WITHOUT CONTRAST Brain: There is multifocal abnormal diffusion restriction in the right hemisphere in a watershed distribution. There is associated magnetic susceptibility effect in the right frontal, occipital and posterior parietal lobes . No midline shift or other mass effect. There is mild contrast enhancement associated with the sites. Normal white matter signal, parenchymal volume and CSF spaces. The midline structures are normal. Vascular: Major flow voids are preserved. Skull and upper cervical spine: Normal calvarium and skull base. Visualized upper cervical spine and soft tissues are normal. Sinuses/Orbits:No paranasal sinus fluid levels or advanced mucosal thickening. No mastoid or middle ear effusion. Normal orbits. MR VENOGRAM WITHOUT CONTRAST Superior sagittal sinus: Normal. Straight sinus: Normal. Inferior sagittal sinus, vein of Galen and internal cerebral veins: Normal. Transverse sinuses: Normal. Sigmoid sinuses: Normal. Visualized jugular veins: Normal. IMPRESSION: 1. Multifocal acute ischemia in the right hemisphere in a watershed distribution. Associated petechial hemorrhage Heidelberg classification 1b: HI2, confluent petechiae, no mass effect. 2. Contrast enhancement at the sites of abnormal diffusion restriction are likely due to blood brain barrier breakdown in the setting of ischemia. 3. Normal MRV of the head. Electronically Signed   By: Ulyses Jarred M.D.   On: 01/20/2020 00:58   US Carotid Bilateral (at [facility] and [facility] only)  Result Date: 01/20/2020 CLINICAL DATA:  49 year old female with history of stroke. EXAM: BILATERAL CAROTID DUPLEX ULTRASOUND TECHNIQUE: Pearline Cables scale imaging, color Doppler and duplex ultrasound were performed of bilateral carotid and vertebral arteries in the neck. COMPARISON:  None.  FINDINGS: Criteria: Quantification of carotid stenosis is based on velocity parameters that correlate the residual internal carotid diameter with NASCET-based stenosis levels, using the diameter of the distal internal carotid lumen as the denominator for stenosis measurement. The following velocity measurements were obtained: RIGHT ICA: Peak systolic velocity 831 cm/sec, End diastolic velocity 34 cm/sec CCA: Peak systolic velocity 517 cm/sec SYSTOLIC ICA/CCA RATIO:  1.8 ECA: Peak systolic velocity 616 cm/sec LEFT ICA: Peak systolic velocity 073 cm/sec, End diastolic velocity 25 cm/sec CCA: 710 cm/sec SYSTOLIC ICA/CCA RATIO:  1.0 ECA: 106 cm/sec RIGHT CAROTID ARTERY: Moderate multifocal atherosclerotic plaque formation. No significant tortuosity. Normal low resistance waveforms. RIGHT VERTEBRAL ARTERY:  Antegrade flow. LEFT CAROTID ARTERY: Mild multifocal atherosclerotic plaque formation. No significant tortuosity. Normal low resistance waveforms. LEFT VERTEBRAL ARTERY:  Antegrade flow. Upper extremity non-invasive blood pressures: Not obtained. IMPRESSION: 1. Right carotid artery system: 50-69% stenosis secondary to moderate multifocal atherosclerotic plaque formation. 2. Left carotid artery system: Less than 50% stenosis secondary to mild multifocal atherosclerotic plaque formation. 3.  Vertebral artery system: Patent with antegrade flow bilaterally. Ruthann Cancer, MD [facility] Radiology Electronically Signed   By: Ruthann Cancer MD   On: 01/20/2020 07:58   US Venous Img Lower Bilateral (DVT)  Result Date: 01/20/2020 CLINICAL DATA:  49 year old with bilateral leg swelling. EXAM: BILATERAL LOWER EXTREMITY VENOUS DOPPLER ULTRASOUND TECHNIQUE: Gray-scale sonography with graded compression, as well as color Doppler and duplex ultrasound were performed to evaluate the lower extremity deep venous systems from the level of the common femoral vein and including the  common femoral, femoral, profunda femoral, popliteal and calf veins including the posterior tibial, peroneal and gastrocnemius  veins when visible. The superficial great saphenous vein was also interrogated. Spectral Doppler was utilized to evaluate flow at rest and with distal augmentation maneuvers in the common femoral, femoral and popliteal veins. COMPARISON:  None. FINDINGS: RIGHT LOWER EXTREMITY Common Femoral Vein: No evidence of thrombus. Normal compressibility, respiratory phasicity and response to augmentation. Saphenofemoral Junction: No evidence of thrombus. Normal compressibility and flow on color Doppler imaging. Profunda Femoral Vein: No evidence of thrombus. Normal compressibility and flow on color Doppler imaging. Femoral Vein: No evidence of thrombus. Normal compressibility, respiratory phasicity and response to augmentation. Popliteal Vein: No evidence of thrombus. Normal compressibility, respiratory phasicity and response to augmentation. Calf Veins: Very limited evaluation. Other Findings:  None. LEFT LOWER EXTREMITY Common Femoral Vein: No evidence of thrombus. Normal compressibility, respiratory phasicity and response to augmentation. Saphenofemoral Junction: No evidence of thrombus. Normal compressibility and flow on color Doppler imaging. Profunda Femoral Vein: No evidence of thrombus. Normal compressibility and flow on color Doppler imaging. Femoral Vein: No evidence of thrombus. Normal compressibility, respiratory phasicity and response to augmentation. Popliteal Vein: No evidence of thrombus. Normal compressibility, respiratory phasicity and response to augmentation. Calf Veins: Very limited evaluation. Other Findings:  None. IMPRESSION: No evidence of deep venous thrombosis in either lower extremity. Deep calf veins could not be evaluated due to body habitus and edema. Electronically Signed   By: Markus Daft M.D.   On: 01/20/2020 12:44   MR MRV HEAD W WO CONTRAST  Result Date:  01/20/2020 CLINICAL DATA:  headache and vision change EXAM: MRI HEAD WITHOUT CONTRAST MRV HEAD WITHOUT AND WITH CONTRAST TECHNIQUE: Multiplanar, multiecho pulse sequences of the brain and surrounding structures were obtained without intravenous contrast. Angiographic images of the intracranial venous structures were obtained using MRV technique without and with intravenous contrast. COMPARISON:  None. FINDINGS: MRI HEAD WITHOUT CONTRAST Brain: There is multifocal abnormal diffusion restriction in the right hemisphere in a watershed distribution. There is associated magnetic susceptibility effect in the right frontal, occipital and posterior parietal lobes . No midline shift or other mass effect. There is mild contrast enhancement associated with the sites. Normal white matter signal, parenchymal volume and CSF spaces. The midline structures are normal. Vascular: Major flow voids are preserved. Skull and upper cervical spine: Normal calvarium and skull base. Visualized upper cervical spine and soft tissues are normal. Sinuses/Orbits:No paranasal sinus fluid levels or advanced mucosal thickening. No mastoid or middle ear effusion. Normal orbits. MR VENOGRAM WITHOUT CONTRAST Superior sagittal sinus: Normal. Straight sinus: Normal. Inferior sagittal sinus, vein of Galen and internal cerebral veins: Normal. Transverse sinuses: Normal. Sigmoid sinuses: Normal. Visualized jugular veins: Normal. IMPRESSION: 1. Multifocal acute ischemia in the right hemisphere in a watershed distribution. Associated petechial hemorrhage Heidelberg classification 1b: HI2, confluent petechiae, no mass effect. 2. Contrast enhancement at the sites of abnormal diffusion restriction are likely due to blood brain barrier breakdown in the setting of ischemia. 3. Normal MRV of the head. Electronically Signed   By: Ulyses Jarred M.D.   On: 01/20/2020 00:58        Scheduled Meds: .  stroke: mapping our early stages of recovery book   Does not  apply Once  . atorvastatin  80 mg Oral Daily  . insulin aspart  0-15 Units Subcutaneous TID WC  . insulin aspart  0-5 Units Subcutaneous QHS   Continuous Infusions: . heparin 1,750 Units/hr (01/21/20 0528)     LOS: 0 days    Time spent: 34 mins     Junie Spencer  Carmelia Roller, MD [facility] Pager [phone]  If 7PM-7AM, please contact night-coverage 01/21/2020, 8:43 AM

## 2020-01-21 NOTE — Procedures (Signed)
Transesophageal Echocardiogram :  Indication: stroke  Procedure:8cc of viscous lidocaine were given orally to provide local anesthesia to the oropharynx. The patient was positioned supine on the left side, bite block provided. The patient was moderately sedated with the doses of versed and fentanyl as detailed below.  Using digital technique an omniplane probe was advanced into the esophagus without incident.   Moderate sedation: 1. Sedation used:  Versed: 3mg , Fentanyl: 37mcg 2. Time administered:  14:38   Time when patient started recovery: 14:53 3. I was face to face during this time 15 mins  See report in EPIC  for complete details: In brief, imaging revealed normal LV function with no RWMAs and no mural apical thrombus.  .  Estimated ejection fraction was 55%.  Right sided cardiac chambers were normal with no evidence of pulmonary hypertension.  Imaging of the septum showed no ASD or VSD Bubble study was negative for shunt 2D and color flow confirmed no PFO  The LA was well visualized in orthogonal views.  There was no spontaneous contrast and no thrombus in the LA and LA appendage   The descending thoracic aorta had no  mural aortic debris with no evidence of aneurysmal dilation or disection   Kate Sable 01/21/2020 3:38 PM

## 2020-01-21 NOTE — Progress Notes (Signed)
Physical Therapy Treatment Patient Details Name: Joanna [name] MRN: [id number] DOB: 12-04-1970 Today's Date: 01/21/2020    History of Present Illness 49 y.o. female past medical history of diabetes, obesity, PCOS, sleep apnea, hypertension, hyperlipidemia, depression and anxiety, presented to the emergency room for evaluation of headache and changes with her vision and balance.  Her last known well was sometime on Friday, 01/16/2020 when she noted sudden onset of a headache which was on the right side of her head which was very persistent and did not get relieved.    PT Comments    Pt did better with mobility and ambulation this date, though still significantly different from her baseline. She was better able to tolerate brief bout of single HHA ambulation (still slow and hesitant).  She displayed (despite mild but repeated cuing) some L neglect and even hit L walker wheel on stationary obstacle at one point.  Pt did fatigue with the effort, but even on room air her O2 stayed in the 90s t/o the effort.  Performed static standing, minimally dynamic standing balance acts with education on safety with return home and continued deliberate awareness of the L side.    Follow Up Recommendations  Home health PT;Supervision/Assistance - 24 hour     Equipment Recommendations  Rolling walker with 5" wheels;3in1 (PT)    Recommendations for Other Services       Precautions / Restrictions Precautions Precautions: Fall Restrictions Weight Bearing Restrictions: No    Mobility  Bed Mobility Overal bed mobility: Modified Independent             General bed mobility comments: Pt was able to get to sitting EOB w/o assist w/o bed rail  Transfers Overall transfer level: Modified independent Equipment used: Rolling walker (2 wheeled)             General transfer comment: Increased confidence with rising today, no assist needs and less reliant on walker  Ambulation/Gait Ambulation/Gait  assistance: Min guard Gait Distance (Feet): 170 Feet Assistive device: Rolling walker (2 wheeled);1 person hand held assist       General Gait Details: Pt again with slow and somewhat guarded ambulation, but good overall effort.  She was on room air with sats staying in the 90s, some increased HR similar to yesterday (110s).  She did have 2 episodes of L wheels hitting nonmoving objects despite some cuing for L side awareness.  Pt did trial single HHA ambulation with better confidnce and speed than yesterday, though still much better with walker.   Stairs             Wheelchair Mobility    Modified Rankin (Stroke Patients Only)       Balance Overall balance assessment: Needs assistance Sitting-balance support: No upper extremity supported Sitting balance-Leahy Scale: Good     Standing balance support: Bilateral upper extremity supported Standing balance-Leahy Scale: Fair Standing balance comment: Better able to challenge herself with single UE assist (even occasaionally without)                            Cognition Arousal/Alertness: Awake/alert Behavior During Therapy: WFL for tasks assessed/performed Overall Cognitive Status: Within Functional Limits for tasks assessed                                 General Comments: again shows some L side neglect that improves with cuing  Exercises      General Comments        Pertinent Vitals/Pain Pain Assessment: No/denies pain    Home Living                      Prior Function            PT Goals (current goals can now be found in the care plan section) Progress towards PT goals: Progressing toward goals    Frequency    7X/week      PT Plan Current plan remains appropriate    Co-evaluation              AM-PAC PT "6 Clicks" Mobility   Outcome Measure  Help needed turning from your back to your side while in a flat bed without using bedrails?: None Help  needed moving from lying on your back to sitting on the side of a flat bed without using bedrails?: None Help needed moving to and from a bed to a chair (including a wheelchair)?: A Little Help needed standing up from a chair using your arms (e.g., wheelchair or bedside chair)?: A Little Help needed to walk in hospital room?: A Little Help needed climbing 3-5 steps with a railing? : A Lot 6 Click Score: 19    End of Session Equipment Utilized During Treatment: Gait belt Activity Tolerance: Patient limited by fatigue;Patient tolerated treatment well Patient left: in bed;with call bell/phone within reach Nurse Communication: Mobility status PT Visit Diagnosis: Muscle weakness (generalized) (M62.81);Hemiplegia and hemiparesis;Other abnormalities of gait and mobility (R26.89);Difficulty in walking, not elsewhere classified (R26.2) Hemiplegia - Right/Left: Left Hemiplegia - dominant/non-dominant: Non-dominant Hemiplegia - caused by: Cerebral infarction     Time: 8786-7672 PT Time Calculation (min) (ACUTE ONLY): 27 min  Charges:  $Gait Training: 8-22 mins $Therapeutic Activity: 8-22 mins                     Kreg Shropshire, DPT 01/21/2020, 3:09 PM

## 2020-01-21 NOTE — Progress Notes (Signed)
Inpatient Diabetes Program Recommendations  AACE/ADA: New Consensus Statement on Inpatient Glycemic Control (2015)  Target Ranges:  Prepandial:   less than 140 mg/dL      Peak postprandial:   less than 180 mg/dL (1-2 hours)      Critically ill patients:  140 - 180 mg/dL   Lab Results  Component Value Date   GLUCAP 253 (H) 01/21/2020   HGBA1C 13.7 (H) 01/20/2020    Review of Glycemic Control Results for Joanna [name], Joanna [name] (MRN [id number]) as of 01/21/2020 11:12  Ref. Range 01/20/2020 12:20 01/20/2020 17:37 01/20/2020 23:19 01/21/2020 08:20  Glucose-Capillary Latest Ref Range: 70 - 99 mg/dL 287 (H) 268 (H) 270 (H) 253 (H)   Diabetes history: Type 2 DM Outpatient Diabetes medications: Glipizide 10 mg QAM, Levemir 10 units QD Current orders for Inpatient glycemic control: Novolog 0-15 units TID, Novolog 0-5 units QHS  Inpatient Diabetes Program Recommendations:    Consider adding Levemir 20 units QD.  Secure chat sent to MD.  Thanks, Bronson Curb, MSN, RNC-OB Diabetes Coordinator [phone] (8a-5p)

## 2020-01-21 NOTE — Progress Notes (Signed)
*  PRELIMINARY RESULTS* Echocardiogram Echocardiogram Transesophageal has been performed.  Sherrie Sport 01/21/2020, 3:28 PM

## 2020-01-21 NOTE — Progress Notes (Signed)
*  PRELIMINARY RESULTS* Echocardiogram 2D Echocardiogram has been performed.  Sherrie Sport 01/21/2020, 9:11 AM

## 2020-01-22 ENCOUNTER — Encounter: Payer: Self-pay | Admitting: Cardiology

## 2020-01-22 ENCOUNTER — Telehealth: Payer: Self-pay | Admitting: *Deleted

## 2020-01-22 ENCOUNTER — Inpatient Hospital Stay: Payer: Medicaid Other

## 2020-01-22 ENCOUNTER — Inpatient Hospital Stay
Admit: 2020-01-22 | Discharge: 2020-01-22 | Disposition: A | Discharge status: Home / Self Care | Payer: Medicaid Other | Attending: Physician Assistant | Admitting: Physician Assistant

## 2020-01-22 DIAGNOSIS — E785 Hyperlipidemia, unspecified: Secondary | ICD-10-CM

## 2020-01-22 DIAGNOSIS — I639 Cerebral infarction, unspecified: Secondary | ICD-10-CM

## 2020-01-22 DIAGNOSIS — E1169 Type 2 diabetes mellitus with other specified complication: Secondary | ICD-10-CM

## 2020-01-22 LAB — CBC
HCT: 46.7 % — ABNORMAL HIGH (ref 36.0–46.0)
Hemoglobin: 15.1 g/dL — ABNORMAL HIGH (ref 12.0–15.0)
MCH: 28.1 pg (ref 26.0–34.0)
MCHC: 32.3 g/dL (ref 30.0–36.0)
MCV: 86.8 fL (ref 80.0–100.0)
Platelets: 237 10*3/uL (ref 150–400)
RBC: 5.38 MIL/uL — ABNORMAL HIGH (ref 3.87–5.11)
RDW: 13.3 % (ref 11.5–15.5)
WBC: 8.5 10*3/uL (ref 4.0–10.5)
nRBC: 0 % (ref 0.0–0.2)

## 2020-01-22 LAB — GLUCOSE, CAPILLARY
Glucose-Capillary: 250 mg/dL — ABNORMAL HIGH (ref 70–99)
Glucose-Capillary: 276 mg/dL — ABNORMAL HIGH (ref 70–99)

## 2020-01-22 LAB — BASIC METABOLIC PANEL
Anion gap: 9 (ref 5–15)
BUN: 8 mg/dL (ref 6–20)
CO2: 26 mmol/L (ref 22–32)
Calcium: 8.4 mg/dL — ABNORMAL LOW (ref 8.9–10.3)
Chloride: 102 mmol/L (ref 98–111)
Creatinine, Ser: 0.66 mg/dL (ref 0.44–1.00)
GFR, Estimated: >60 mL/min (ref >60)
Glucose, Bld: 240 mg/dL — ABNORMAL HIGH (ref 70–99)
Potassium: 3.8 mmol/L (ref 3.5–5.1)
Sodium: 137 mmol/L (ref 135–145)

## 2020-01-22 LAB — CARDIOLIPIN ANTIBODIES, IGG, IGM, IGA
Anticardiolipin IgA: <9 APL U/mL (ref 0–11)
Anticardiolipin IgG: <9 GPL U/mL (ref 0–14)
Anticardiolipin IgM: <9 MPL U/mL (ref 0–12)

## 2020-01-22 LAB — HEPARIN LEVEL (UNFRACTIONATED): Heparin Unfractionated: 0.17 IU/mL — ABNORMAL LOW (ref 0.30–0.70)

## 2020-01-22 LAB — MRSA PCR SCREENING: MRSA by PCR: NEGATIVE

## 2020-01-22 MED ORDER — HEPARIN BOLUS VIA INFUSION
3000.0000 [IU] | Freq: Once | INTRAVENOUS | Status: AC
  Administered 2020-01-22: 06:00:00 3000 [IU] via INTRAVENOUS
  Filled 2020-01-22: qty 3000

## 2020-01-22 MED ORDER — INSULIN ASPART 100 UNIT/ML FLEXPEN: 4.0000 [IU] | PEN_INJECTOR | Freq: Three times a day (TID) | SUBCUTANEOUS | 0 refills | Status: DC

## 2020-01-22 MED ORDER — INSULIN ASPART 100 UNIT/ML ~~LOC~~ SOLN: 0.0000 [IU] | Freq: Every day | SUBCUTANEOUS | Status: DC

## 2020-01-22 MED ORDER — INSULIN DETEMIR 100 UNIT/ML FLEXPEN: 25.0000 [IU] | PEN_INJECTOR | Freq: Every day | SUBCUTANEOUS | 0 refills | Status: DC

## 2020-01-22 MED ORDER — INSULIN DETEMIR 100 UNIT/ML ~~LOC~~ SOLN
5.0000 [IU] | Freq: Once | SUBCUTANEOUS | Status: AC
  Administered 2020-01-22: 10:00:00 5 [IU] via SUBCUTANEOUS
  Filled 2020-01-22: qty 0.05

## 2020-01-22 MED ORDER — INSULIN DETEMIR 100 UNIT/ML ~~LOC~~ SOLN
25.0000 [IU] | Freq: Every day | SUBCUTANEOUS | Status: DC
  Filled 2020-01-22: qty 0.25

## 2020-01-22 MED ORDER — PEN NEEDLES 32G X 5 MM MISC
0 refills | Status: DC
Start: 2020-01-22 — End: 2020-10-13

## 2020-01-22 MED ORDER — APIXABAN 5 MG PO TABS: 5.0000 mg | ORAL_TABLET | Freq: Two times a day (BID) | ORAL | 0 refills | Status: DC

## 2020-01-22 MED ORDER — APIXABAN 5 MG PO TABS: 5.0000 mg | ORAL_TABLET | Freq: Two times a day (BID) | ORAL | Status: DC

## 2020-01-22 MED ORDER — ATORVASTATIN CALCIUM 80 MG PO TABS: 80.0000 mg | ORAL_TABLET | Freq: Every day | ORAL | 0 refills | Status: DC

## 2020-01-22 MED ORDER — APIXABAN 5 MG PO TABS
10.0000 mg | ORAL_TABLET | Freq: Two times a day (BID) | ORAL | Status: DC
  Administered 2020-01-22: 13:00:00 10 mg via ORAL
  Filled 2020-01-22: qty 2

## 2020-01-22 MED ORDER — APIXABAN 5 MG PO TABS: 10.0000 mg | ORAL_TABLET | Freq: Two times a day (BID) | ORAL | 0 refills | Status: DC

## 2020-01-22 MED ORDER — INSULIN ASPART 100 UNIT/ML ~~LOC~~ SOLN
0.0000 [IU] | Freq: Three times a day (TID) | SUBCUTANEOUS | Status: DC
  Administered 2020-01-22: 13:00:00 8 [IU] via SUBCUTANEOUS
  Filled 2020-01-22: qty 1

## 2020-01-22 MED ORDER — INSULIN ASPART 100 UNIT/ML ~~LOC~~ SOLN
4.0000 [IU] | Freq: Three times a day (TID) | SUBCUTANEOUS | Status: DC
  Administered 2020-01-22: 13:00:00 4 [IU] via SUBCUTANEOUS
  Filled 2020-01-22: qty 1

## 2020-01-22 NOTE — Progress Notes (Addendum)
Neurology Progress Note   S:// Seen and examined. Remains on heparin drip. No headaches. No new neurological symptoms Reports visual symptoms improving.  O:// Current vital signs: BP (!) 159/89 (BP Location: Left Arm)   Pulse 75   Temp 97.7 F (36.5 C) (Oral)   Resp 16   Ht _0  (1.651 m)   Wt 129.5 kg   SpO2 98%   BMI 47.51 kg/m  Vital signs in last 24 hours: Temp:  [97.7 F (36.5 C)-98.7 F (37.1 C)] 97.7 F (36.5 C) (12/16 0820) Pulse Rate:  [67-86] 75 (12/16 0820) Resp:  [16-22] 16 (12/16 0820) BP: (127-163)/(63-89) 159/89 (12/16 0820) SpO2:  [90 %-98 %] 98 % (12/16 0820) Weight:  [128.8 kg-129.5 kg] 129.5 kg (12/16 0210) General: Awake alert in no distress HEENT: Normocephalic atraumatic with hirsutism CVS: Regular rhythm Abdomen obese nontender Extremities warm well perfused with trace edema Chest clear to auscultation Neurological exam Awake alert oriented x3 No aphasia No dysarthria Cranial nerves: Pupils equal round reactive light, extraocular movements intact, visual field examination reveals no field cut, face is symmetric and facial sensation is intact, tongue and palate are midline. Motor examination reveals no drift in any of the 4 extremities..  She has difficulty performing rapid finger taps on the left hand, but that seems to be improving from 2 days ago.  Right upper and lower extremity are full strength. Sensation: Intact to light touch without extinction. Coordination: No dysmetria although she is slow to perform finger-nose-finger testing on the left. NIH stroke scale-0  Medications  Current Facility-Administered Medications:  .   stroke: mapping our early stages of recovery book, , Does not apply, Once, Athena Masse, MD .  acetaminophen (TYLENOL) tablet 650 mg, 650 mg, Oral, Q4H PRN **OR** acetaminophen (TYLENOL) 160 MG/5ML solution 650 mg, 650 mg, Per Tube, Q4H PRN **OR** acetaminophen (TYLENOL) suppository 650 mg, 650 mg, Rectal, Q4H  PRN, Judd Gaudier V, MD .  atorvastatin (LIPITOR) tablet 80 mg, 80 mg, Oral, Daily, Amie Portland, MD, 80 mg at 01/22/20 0849 .  heparin ADULT infusion 100 units/mL (25000 units/25m sodium chloride 0.45%), 2,400 Units/hr, Intravenous, Continuous, WWyvonnia Dusky MD, Last Rate: 24 mL/hr at 01/22/20 0608, 2,400 Units/hr at 01/22/20 0608 .  insulin aspart (novoLOG) injection 0-15 Units, 0-15 Units, Subcutaneous, TID WC, WWyvonnia Dusky MD .  insulin aspart (novoLOG) injection 0-5 Units, 0-5 Units, Subcutaneous, QHS, Williams, Jamiese M, MD .  insulin aspart (novoLOG) injection 4 Units, 4 Units, Subcutaneous, TID WC, WWyvonnia Dusky MD .  [Derrill MemoON 01/23/2020] insulin detemir (LEVEMIR) injection 25 Units, 25 Units, Subcutaneous, Daily, WJimmye Norman Jamiese M, MD .  insulin detemir (LEVEMIR) injection 5 Units, 5 Units, Subcutaneous, Once, WJimmye Norman JAugust Saucer MD .  insulin starter kit- pen needles (English) 1 kit, 1 kit, Other, Once, WWyvonnia Dusky MD  Labs CBC    Component Value Date/Time   WBC 8.5 01/22/2020 0430   RBC 5.38 (H) 01/22/2020 0430   HGB 15.1 (H) 01/22/2020 0430   HCT 46.7 (H) 01/22/2020 0430   PLT 237 01/22/2020 0430   MCV 86.8 01/22/2020 0430   MCH 28.1 01/22/2020 0430   MCHC 32.3 01/22/2020 0430   RDW 13.3 01/22/2020 0430   LYMPHSABS 3.5 01/19/2020 1444   MONOABS 0.8 01/19/2020 1444   EOSABS 0.1 01/19/2020 1444   BASOSABS 0.1 01/19/2020 1444   CMP     Component Value Date/Time   NA 137 01/22/2020 0430   K 3.8 01/22/2020 0430  CL 102 01/22/2020 0430   CO2 26 01/22/2020 0430   GLUCOSE 240 (H) 01/22/2020 0430   BUN 8 01/22/2020 0430   CREATININE 0.66 01/22/2020 0430   CREATININE 0.89 10/30/2018 1016   CALCIUM 8.4 (L) 01/22/2020 0430   PROT 7.9 01/19/2020 1444   ALBUMIN 3.4 (L) 01/19/2020 1444   AST 25 01/19/2020 1444   ALT 26 01/19/2020 1444   ALKPHOS 109 01/19/2020 1444   BILITOT 0.7 01/19/2020 1444   GFRNONAA >60 01/22/2020 0430    GFRNONAA 76 10/30/2018 1016   GFRAA 88 10/30/2018 1016    glycosylated hemoglobin-13.7  Lipid Panel     Component Value Date/Time   CHOL 190 01/20/2020 0435   TRIG 372 (H) 01/20/2020 0435   HDL 23 (L) 01/20/2020 0435   CHOLHDL 8.3 01/20/2020 0435   VLDL 74 (H) 01/20/2020 0435   LDLCALC 93 01/20/2020 0435   Hypercoagulable work-up thus far  Antithrombin activity 96-normal Lupus anticoagulant negative-normal Protein S functional 93-normal Protein S total 120-normal  Transthoracic echocardiogram IMPRESSIONS  1. Left ventricular ejection fraction, by estimation, is 55 to 60%. The  left ventricle has normal function. The left ventricle has no regional  wall motion abnormalities. Left ventricular diastolic parameters are  consistent with Grade I diastolic  dysfunction (impaired relaxation).  2. Right ventricular systolic function is normal. The right ventricular  size is normal.  3. The mitral valve is grossly normal. No evidence of mitral valve  regurgitation.  4. The aortic valve was not well visualized. Aortic valve regurgitation  is not visualized.  5. The inferior vena cava is normal in size with greater than 50%  respiratory variability, suggesting right atrial pressure of 3 mmHg.   Transesophageal echocardiogram-no left atrial appendage thrombus, no PFO.  Imaging I have reviewed images in epic and the results pertinent to this consultation are: MRI examination of the brain-right hemispheric acute ischemic strokes and watershed pattern petechial hemorrhage. MRV negative for thrombosis. Dopplers with 50 to 69% right carotid stenosis.  Left carotid less than 50% stenosis.  Vertebral arteries antegrade bilaterally.  CTA of the head and neck completed- Abnormal scan There is a rounded focal filling defect within the distal right common carotid artery extending into the proximal right internal carotid artery concerning for intraluminal thrombus. No LVO. Partially  visualized opacities in the lung apices which could represent mild pulmonary edema or infection.  Lower extremity Dopplers negative for DVT.  Deep calf veins could not be evaluated due to body habitus and edema  Head CT from this morning with no evidence of hemorrhagic transformation.  Stroke seen on MRI are not very well appreciated as hypodensities.  Assessment:  49 year old woman with past medical history of diabetes, obesity, PCOS, sleep apnea, hypertension, hyperlipidemia, anxiety/depression presented for evaluation of headache and changes with her vision as well as balance with last known normal multiple days prior to presentation-not a candidate for TPA or intervention noted to have late acute early subacute strokes in the right cerebral hemisphere in a watershed distribution.  Carotid Dopplers showed 50 to 69% right carotid stenosis but the CTA that was done in follow-up showed a thrombus extending from the right common carotid into the right internal carotid.  She was started on heparin drip stroke protocol 2 days ago. Examination somewhat improved with NIH stroke scale 0 today.  No visual field deficits noted. Hypercoagulable work-up underway and thus far has been unremarkable. Repeat head CT today with no evidence of hemorrhagic transformation.  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  Recommendations: -LDL goal less than 70-atorvastatin 80 mg daily for now. -Blood sugar management per primary team-hemoglobin A1c greater than 13.  This is severely deranged and he needs aggressive management of sugars as an outpatient. -CT head this morning showed no evidence of hemorrhagic transformation or bleed.  I would recommend stopping her heparin and starting Eliquis po.  I will inform the hospitalist as well as consult  the  pharmacist for Eliquis.  Duration of Eliquis-for now, I would recommend at least continuing for 3 months, following up with outpatient neurology with a CTA head and neck and if the thrombus in the carotid has resolved, at that point, the outpatient neurologist can make a decision on anticoagulation cessation.  If she were to exhibit atrial fibrillation, then she would require lifelong anticoagulation. -TEE completed.  No intracardiac source of thrombus.  Would recommend long-term cardiac monitoring-contacted cardiology, will do 14-day outpatient monitoring followed by EP consultation for loop recorder placement. -PT OT speech therapy -Blood pressure goal should be 140/90 or less on discharge.   Outpatient neurology follow-up in the stroke clinic in 4 to 6 weeks- [facility] Dr. Leonie Man.   CT angiogram head and neck in about 3 months-we will defer that to the outpatient providers for follow-up.  Discussed with cardiology team as well as primary hospitalist Dr. Jimmye Norman.  -- Amie Portland, MD [facility] Pager: [phone] If 7pm to 7am, please call on call as listed on AMION.

## 2020-01-22 NOTE — Telephone Encounter (Signed)
Received response back from [employer] rep that second [employer]IO will be shipped to patient.  Referral to EP placed and routing to scheduling to arrange,.

## 2020-01-22 NOTE — Progress Notes (Signed)
Occupational Therapy Treatment Patient Details Name: Joanna [name] MRN: [id number] DOB: [birth year] Today's Date: 01/22/2020    History of present illness 49 y.o. female past medical history of diabetes, obesity, PCOS, sleep apnea, hypertension, hyperlipidemia, depression and anxiety, presented to the emergency room for evaluation of headache and changes with her vision and balance.  Her last known well was sometime on Friday, 01/16/2020 when she noted sudden onset of a headache which was on the right side of her head which was very persistent and did not get relieved.   OT comments  Pt seen for OT tx this date. Pt pleasant and very pleased to share that her symptoms were improving. Pt eager to participate and to return home. Pt noting it's taking her brain less time to recognize her LUE as her own and having decreased difficulty with ambulating/balance, also able to read more easily.   Pt instructed in visual scanning exercises with very minimal difficulty noted and able to self correct. Pt noted she plans to stop smoking; pt educated and OT facilitated problem solving to support pt's goal of smoking cessation, including triggers, coping strategies, use of cut straws to hold in hand, and addressing routines around smoking. Pt instructed in anxiety/stress management strategies and how to incorporate into daily routines. Pt verbalized plans to implement several learned strategies into daily life, and took notes to support recall. Pt continues to benefit from skilled OT services. Continue to recommend [place] services to maximize return to driving, independence with ADL, and IADL tasks, including self mgt of chronic disease and implementing strategies into routines to help improve her overall health and well-being.    Follow Up Recommendations  Home health OT    Equipment Recommendations  Other (comment) Management consultant)    Recommendations for Other Services      Precautions / Restrictions  Precautions Precautions: Fall Restrictions Weight Bearing Restrictions: No       Mobility Bed Mobility Overal bed mobility: Modified Independent                Transfers Overall transfer level: Modified independent               General transfer comment: pt ambulating with IV pole    Balance Overall balance assessment: Mild deficits observed, not formally tested                                         ADL either performed or assessed with clinical judgement   ADL                                               Vision Baseline Vision/History: Wears glasses Wears Glasses: Distance only Patient Visual Report: Other (comment) Additional Comments: Pt noting improvement in flashing lights and ability to visualize objects on the L as well as faster for her to visually recognize her LUE as her own   Perception     Praxis      Cognition Arousal/Alertness: Awake/alert Behavior During Therapy: WFL for tasks assessed/performed Overall Cognitive Status: Within Functional Limits for tasks assessed                                 General Comments: no  neglect noted        Exercises Other Exercises Other Exercises: Pt instructed in visual scanning exercises with very minimal difficulty noted and able to self correct Other Exercises: Pt noted she plans to stop smoking; pt educated and OT facilitated problem solving to support pt's goal of smoking cessation, including triggers, coping strategies, use of cut straws to hold in hand, and addressing routines around smoking Other Exercises: Pt instructed in anxiety/stress management strategies and how to incorporate into daily routines   Shoulder Instructions       General Comments      Pertinent Vitals/ Pain       Pain Assessment: No/denies pain  Home Living                                          Prior Functioning/Environment               Frequency  Min 2X/week        Progress Toward Goals  OT Goals(current goals can now be found in the care plan section)  Progress towards OT goals: Progressing toward goals  Acute Rehab OT Goals Patient Stated Goal: go home OT Goal Formulation: With patient Time For Goal Achievement: 02/03/20 Potential to Achieve Goals: Good  Plan Discharge plan remains appropriate;Frequency remains appropriate    Co-evaluation                 AM-PAC OT "6 Clicks" Daily Activity     Outcome Measure   Help from another person eating meals?: None Help from another person taking care of personal grooming?: None Help from another person toileting, which includes using toliet, bedpan, or urinal?: None Help from another person bathing (including washing, rinsing, drying)?: A Little Help from another person to put on and taking off regular upper body clothing?: None Help from another person to put on and taking off regular lower body clothing?: A Little 6 Click Score: 22    End of Session    OT Visit Diagnosis: Other abnormalities of gait and mobility (R26.89);Other symptoms and signs involving cognitive function;Hemiplegia and hemiparesis Hemiplegia - Right/Left: Left Hemiplegia - dominant/non-dominant: Non-Dominant Hemiplegia - caused by: Cerebral infarction   Activity Tolerance Patient tolerated treatment well   Patient Left in chair;with call bell/phone within reach   Nurse Communication          Time: 6010-9323 OT Time Calculation (min): 64 min  Charges: OT General Charges $OT Visit: 1 Visit OT Treatments $Self Care/Home Management : 38-52 mins $Neuromuscular Re-education: 8-22 mins  Jeni Salles, MPH, MS, OTR/L ascom [phone] 01/22/20, 12:53 PM

## 2020-01-22 NOTE — Progress Notes (Signed)
Inpatient Diabetes Program Recommendations  AACE/ADA: New Consensus Statement on Inpatient Glycemic Control   Target Ranges:  Prepandial:   less than 140 mg/dL      Peak postprandial:   less than 180 mg/dL (1-2 hours)      Critically ill patients:  140 - 180 mg/dL   Results for Joanna [name], Joanna [name] (MRN [id number]) as of 01/22/2020 09:03  Ref. Range 01/21/2020 08:20 01/21/2020 11:27 01/21/2020 14:23 01/21/2020 16:38 01/21/2020 21:09 01/22/2020 08:24  Glucose-Capillary Latest Ref Range: 70 - 99 mg/dL 253 (H) 239 (H) 152 (H) 205 (H) 251 (H) 250 (H)  Results for Joanna [name], Joanna [name] (MRN [id number]) as of 01/22/2020 09:03  Ref. Range 01/20/2020 04:35  Hemoglobin A1C Latest Ref Range: 4.8 - 5.6 % 13.7 (H)   Review of Glycemic Control  Diabetes history: DM2 Outpatient Diabetes medications: No DM medications in over a year; reports she was taking Glipizide, Metformin and Victoza in the past Current orders for Inpatient glycemic control: Levemir 20 units daily, Novolog 0-15 units TID with meals, Novolog 0-5 units QHS  Inpatient Diabetes Program Recommendations:    Insulin: Please consider increasing Levemir to 25 units daily and ordering Novolog 4 units TID with meals for meal coverage if patient eats at least 50% of meals.  HbgA1C: A1C 13.7% on 01/20/20 indicating an average glucose of 346 mg/dl over the past 2-3 months. Would recommend patient be discharged on insulin (patient is agreeable to use insulin outpatient and would prefer insulin pens).  Thanks, Barnie Alderman, RN, MSN, CDE Diabetes Coordinator Inpatient Diabetes Program [phone] (Team Pager from 8am to 5pm)

## 2020-01-22 NOTE — Consult Note (Signed)
ANTICOAGULATION CONSULT NOTE - Initial Consult  Pharmacy Consult for apixaban Indication: Carotid Thrombus  Allergies  Allergen Reactions   Wellbutrin [Bupropion] Rash    Patient Measurements: Height: 5' 5"  (165.1 cm) Weight: 129.5 kg (285 lb 8 oz) IBW/kg (Calculated) : 57  Vital Signs: Temp: 97.7 F (36.5 C) (12/16 0820) Temp Source: Oral (12/16 0820) BP: 159/89 (12/16 0820) Pulse Rate: 75 (12/16 0820)  Labs: Recent Labs    01/19/20 1444 01/20/20 2000 01/21/20 0247 01/21/20 1154 01/21/20 1950 01/22/20 0430  HGB 16.9*  --  15.9*  --   --  15.1*  HCT 50.7*  --  48.1*  --   --  46.7*  PLT 299  --  255  --   --  237  APTT 28  --   --   --   --   --   LABPROT 12.8  --   --   --   --   --   INR 1.0  --   --   --   --   --   HEPARINUNFRC  --    < > <0.10* <0.10* <0.10* 0.17*  CREATININE 0.71  --  0.73  --   --  0.66   < > = values in this interval not displayed.    Estimated Creatinine Clearance: 112.9 mL/min (by C-G formula based on SCr of 0.66 mg/dL).   Medical History: Past Medical History:  Diagnosis Date   Anxiety    Dx at age 44   Arthritis    knee   CAD (coronary artery disease)    a. Inferior STEMI 07/2014 - s/p thrombectomy of RCA, no stenting.   Depression    at age 14; bipolar recent   Diabetes mellitus without complication ([place])    Diabetes type 2, uncontrolled ([place]) Dx 2010   DJD (degenerative joint disease)    Gout    Hyperlipidemia Dx 2010   Hypertension    Dx at age 20   IBS (irritable bowel syndrome)    Interstitial lung disease ([place]) Dx 2011   No biopsy   Lung disorder Dx 2011   Morbid obesity ([facility])    Neuropathy    Non compliance w medication regimen    PCOS (polycystic ovarian syndrome) 1988   no children    Sleep apnea    STEMI (ST elevation myocardial infarction) ([place]) 07/23/14   Tobacco abuse     Medications:  Scheduled:    stroke: mapping our early stages of recovery book   Does not apply Once    apixaban  10 mg Oral BID   Followed by   Derrill Memo ON 01/29/2020] apixaban  5 mg Oral BID   atorvastatin  80 mg Oral Daily   insulin aspart  0-15 Units Subcutaneous TID WC   insulin aspart  0-5 Units Subcutaneous QHS   insulin aspart  4 Units Subcutaneous TID WC   [START ON 01/23/2020] insulin detemir  25 Units Subcutaneous Daily   insulin starter kit- pen needles  1 kit Other Once    Assessment: 49 year old female presenting with confusion x 3 days. PMH includes DM with neuropathy, HTN, morbid obesity, arthritis, OSA, CAD, and depression. CBC stable.   Per Neuro note: "...persistent symptoms, ED with MRI brain showed multifocal acute ischemia in the Rt hemisphere c/f watershed appearing pattern a/w petechial hemorrhage [place] 1B with no mass-effect. MRV of the head was done was negative for dural venous sinus thrombosis..."  Pharmacy consulted to transition  to Eliquis from heparin gtt. CT head this morning showed no evidence of hemorrhagic transformation or bleed. Pt will follow up with outpatient neurology with a CTA head and neck.   Goal of Therapy:  Monitor platelets by anticoagulation protocol: Yes   Plan: - Discontinue heparin gtt  - Give apixaban 10 mg BID x 7 days, followed by 5 mg BID (dose change 12/23) - Monitor CBC with AM labs   Benn Moulder, PharmD Pharmacy Resident  01/22/2020 11:51 AM

## 2020-01-22 NOTE — Telephone Encounter (Signed)
-----   Message from Joanna Mu, PA-C sent at 01/22/2020 11:13 AM EST ----- Good morning,   This is a patient with cryptogenic stroke. TEE was unrevealing. I will have respiratory place her initial Zio AT. Please mail her a 2nd Zio AT. Please also schedule her with Dr. Caryl Comes or Quentin Ore for consideration of loop recorder. Diagnosis: cryptogenic stroke. Thanks.

## 2020-01-22 NOTE — TOC Transition Note (Signed)
Transition of Care [facility]) - CM/SW Discharge Note   Patient Details  Name: Joanna [name] MRN: [id number] Date of Birth: December 29, 1970  [facility] of [facility] CM/SW Contact:  Kerin Salen, RN Phone Number: 01/22/2020, 3:54 PM   Clinical Narrative:  Patient scheduled appointment with PCP, Truitt Merle 02/03/2020 AT 11:15am at the [facility] in [place]. Patient given appointment information, wrote it down on discharge paperwork, states she will keep appointment. Outpatient PT/OT referral completed and Attending notifed will sign before discharged. Patient also given information about outpatient PT/OT referral and list of providers. No other TOC needs at this time.     Final next level of care: Home/Self Care Barriers to Discharge: Barriers Resolved   Patient Goals and CMS Choice Patient states their goals for this hospitalization and ongoing recovery are:: Return home.   Choice offered to / list presented to : NA  Discharge Placement                Patient to be transferred to facility by: Daughter to provide transportation home.   Patient and family notified of of transfer: 01/22/20  Discharge Plan and Services                DME Arranged: N/A DME Agency: NA       HH Arranged: PT,OT [place] Agency: [place] [facility] Date [place]: 01/22/20 Time [place]: 1572 Representative spoke with at [place]: Floydene Flock [phone]  Social Determinants of Health (SDOH) Interventions     Readmission Risk Interventions No flowsheet data found.

## 2020-01-22 NOTE — Discharge Summary (Signed)
Physician Discharge Summary  Vendela Troung VZC:[id number] DOB: Oct 29, 1970 DOA: 01/19/2020  PCP: Pcp, No  Admit date: 01/19/2020 Discharge date: 01/22/2020  Admitted From: home  Disposition:  Home   Recommendations for Outpatient Follow-up:  1. Follow up with PCP in 1-2 weeks 2. F/u neuro, Dr. Leonie Man, in 4-6 weeks 3. F/u cardio, Dr. Rockey Situ, in 1-2 weeks   Home Health: yes Equipment/Devices:  Discharge Condition: stable CODE STATUS: full Diet recommendation: Heart Healthy / Carb Modified  Brief/Interim Summary: HPI was taken from Dr. Damita Dunnings: [place] Winslow is a 49 y.o. female with medical history significant for DM with neuropathy, HTN, morbid obesity, arthritis, OSA, CAD, depression, who presents to the emergency room with a 3-day history of confusion which she describes as looking at her phone and not knowing what to do with it, not recognizing that her hand is her own hand and seeing flashing and flickering out of the corner of her eye.  She denies one-sided numbness weakness or tingling.  She delayed coming to the emergency room as she thought her symptoms would go away. ED Course: On arrival, blood pressure 167/99, heart rate 86, afebrile, O2 sat 93% on room air.  On her blood work, leukocytosis of 15,000, hemoglobin 16.9.  CMP within normal limits. EKG as reviewed by me : Sinus at 88 with T wave inversion in lateral leads Imaging: MRI/MRV showing multifocal acute ischemia in the right hemisphere in a watershed distribution with associated petechial hemorrhage without mass-effect  The emergency room provider spoke with neurosurgeon on-call who recommended neurology consult.  Avoid aspirin for now unless advised by neurology.  Hospitalist consulted for admission.  Hospital Course from Dr. Jimmye Norman 12/15-12/16/21: Pt was found to have an acute CVA w/ associated petechial hemorrhage. Pt was put on eliquis by neuro after repeat CT head was stable. Pt understands not to take any NSAIDs while  on eliquis as well as the increased bleeding risk of eliquis. Pt will f/u w/ neurologist in [place], Dr. Leonie Man, in 4-6 weeks as per inpatient neuro. Pt had a TTE and TEE which were both neg for ASD/VSD/PFO. Of note, pt had Zio placed by cardio prior to d/c. Pt will also f/u w/ EP as an outpatient. For more information, please see previous progress/consult notes.   Discharge Diagnoses:  Active Problems:   Hypertension   Morbid obesity with BMI of 50.0-59.9, adult (HCC)   CAD (coronary artery disease)   OSA (obstructive sleep apnea)   Cigarette nicotine dependence without complication   Type 2 diabetes mellitus with hyperlipidemia (HCC)   Acute CVA (cerebrovascular accident) ([place])   CVA (cerebral vascular accident) ([place]) Acute CVA: MRI brain showing multifocal acute ischemia right hemisphere watershed distribution with associated petechial hemorrhage. Allow permissive HTN. Continue on statin. Echo shows EF of 12-87%, grade I diastolic dysfunction, no regional wall motion abnormalities & no atrial level shunt detected. TEE today as per cardio. Carotid US shows 50-69% stenosis of right carotid artery system & less than 50% stenosis of left carotid artery system. Neuro following and recs apprec  HTN: hold anti-HTN meds to allow permissive HTN   Morbid obesity: BMI 56.9. Complicates overall care and prognosis   CAD: continue on metoprolol, statin. Aspirin on hold due to petechial hemorrhages on MRI  OSA: continue on BiPAP  Cigarette nicotine dependence: smoking cessation counseling   DM2: poorly controlled. Continue on detemir, SSI w/ accuchecks   Depression: severity unknown. Continue on venlafaxine    Discharge Instructions  Discharge Instructions  Diet - low sodium heart healthy   Complete by: As directed    Diet Carb Modified   Complete by: As directed    Discharge instructions   Complete by: As directed    F/u PCP in 1-2 weeks. F/u w/ neurologist, Dr. Leonie Man, in 4-6  weeks. F/u w/ cardio, Dr. Rockey Situ or his PA, in 2 weeks.   Increase activity slowly   Complete by: As directed      Allergies as of 01/22/2020      Reactions   Wellbutrin [bupropion] Rash      Medication List    TAKE these medications   apixaban 5 MG Tabs tablet Commonly known as: ELIQUIS Take 2 tablets (10 mg total) by mouth 2 (two) times daily for 7 days.   apixaban 5 MG Tabs tablet Commonly known as: ELIQUIS Take 1 tablet (5 mg total) by mouth 2 (two) times daily. Only start this eliquis script after taking eliquis 10mg  BID x 7 days Start taking on: January 29, 2020   atorvastatin 80 MG tablet Commonly known as: LIPITOR Take 1 tablet (80 mg total) by mouth daily. Start taking on: January 23, 2020   insulin aspart 100 UNIT/ML FlexPen Commonly known as: NOVOLOG Inject 4 Units into the skin 3 (three) times daily with meals.   insulin detemir 100 UNIT/ML FlexPen Commonly known as: LEVEMIR Inject 25 Units into the skin daily.   Pen Needles 32G X 5 MM Misc Enough pen needles for levermir 25 units daily & novolog 4 units TID w/ meals x 30 days. No refills            Durable Medical Equipment  (From admission, onward)         Start     Ordered   01/20/20 1604  For home use only DME Walker rolling  Once       Comments: 3 in 1  Question Answer Comment  Walker: With Gutierrez   Patient needs a walker to treat with the following condition Stroke (cerebrum) ([place])      01/20/20 1605          Follow-up Information    Vickie Epley, MD On 02/25/2020.   Specialties: Cardiology, Radiology Why: @ 10:40am Contact information: [place] [address] [phone]              Allergies  Allergen Reactions  . Wellbutrin [Bupropion] Rash    Consultations:  Neuro  cardio   Procedures/Studies: CT ANGIO HEAD W OR WO CONTRAST  Result Date: 01/20/2020 CLINICAL DATA:  Neuro deficit, acute stroke suspected. EXAM: CT  ANGIOGRAPHY HEAD AND NECK TECHNIQUE: Multidetector CT imaging of the head and neck was performed using the standard protocol during bolus administration of intravenous contrast. Multiplanar CT image reconstructions and MIPs were obtained to evaluate the vascular anatomy. Carotid stenosis measurements (when applicable) are obtained utilizing NASCET criteria, using the distal internal carotid diameter as the denominator. CONTRAST:  109mL OMNIPAQUE IOHEXOL 350 MG/ML SOLN COMPARISON:  MRI January 20, 2020. FINDINGS: CT HEAD FINDINGS Brain: Multifocal infarcts were better characterized on recent MRI. Mild associated edema in the right occipital lobe without substantial mass effect. No acute hemorrhage. No hydrocephalus. No mass lesion or abnormal mass effect. Cavum septum pellucidum et vergae, anatomic variant. Vascular: Calcific atherosclerosis of the carotid arteries. Skull: No acute fracture. Sinuses: Visualized sinuses are clear. Orbits: No acute findings. Review of the MIP images confirms the above findings CTA NECK FINDINGS  Aortic arch: Great vessel origins are patent. Right carotid system: Rounded focal filling defect within the distal common carotid artery and the proximal internal carotid artery, concerning for intraluminal thrombus. Additional calcific and noncalcific atherosclerosis at the bifurcation involving the proximal internal carotid artery. The degree of underlying stenosis is difficult to quantify given intraluminal thrombus. Left carotid system: No evidence of dissection, stenosis (50% or greater) or occlusion. Mixed calcific and noncalcific atherosclerosis at the bifurcation. Vertebral arteries: Codominant. No evidence of dissection, stenosis (50% or greater) or occlusion. Skeleton: Acute findings. Other neck: No mass or suspicious adenopathy. Upper chest: Partially imaged ground-glass opacities in the lung apices. Scattered calcific granulomas. Review of the MIP images confirms the above findings  CTA HEAD FINDINGS Anterior circulation: No significant stenosis, proximal occlusion, aneurysm, or vascular malformation. Predominately calcific atherosclerosis of bilateral cavernous and paraclinoid ICAs. Posterior circulation: No significant stenosis, proximal occlusion, aneurysm, or vascular malformation. Venous sinuses: As permitted by contrast timing, patent. Review of the MIP images confirms the above findings IMPRESSION: 1. Rounded focal filling defect within the distal right common carotid artery extending into the proximal internal carotid artery, concerning for intraluminal thrombus. 2. No large vessel occlusion. 3. Please see recent MRI for better characterization of known infarcts. No mass effect or acute hemorrhage. 4. Partially imaged ground-glass opacities in the lung apices, which could represent mild pulmonary edema or infection. Critical findings were discussed with Dr. Rory Percy via telephone at 12:25 p.m. Electronically Signed   By: Margaretha Sheffield MD   On: 01/20/2020 12:37   DG Chest 2 View  Result Date: 01/20/2020 CLINICAL DATA:  Encephalopathy EXAM: CHEST - 2 VIEW COMPARISON:  09/26/2017 FINDINGS: Bilateral interstitial opacities, unchanged. No focal consolidation. Normal cardiomediastinal contours and pleural spaces. IMPRESSION: Unchanged bilateral chronic interstitial opacities. Electronically Signed   By: Ulyses Jarred M.D.   On: 01/20/2020 02:38   CT HEAD WO CONTRAST  Result Date: 01/22/2020 CLINICAL DATA:  Follow-up stroke EXAM: CT HEAD WITHOUT CONTRAST TECHNIQUE: Contiguous axial images were obtained from the base of the skull through the vertex without intravenous contrast. COMPARISON:  01/19/2020, 01/20/20 FINDINGS: Brain: No evidence of acute infarction, hemorrhage, hydrocephalus, extra-axial collection or mass lesion/mass effect. Previously seen infarcts on MRI are not well appreciated on this exam. Partially empty sella is again noted. Cavum septum pellucidum is again noted.  Vascular: No hyperdense vessel or unexpected calcification. Skull: Normal. Negative for fracture or focal lesion. Sinuses/Orbits: No acute finding. Other: None. IMPRESSION: No acute abnormality noted. Previously seen infarcts on MRI are not well appreciated on this exam. Electronically Signed   By: Inez Catalina M.D.   On: 01/22/2020 09:18   CT HEAD WO CONTRAST  Result Date: 01/19/2020 CLINICAL DATA:  Neuro deficit, acute, stroke suspected. Additional history provided: Patient reports headache, double vision, confusion, symptoms for 3 days. EXAM: CT HEAD WITHOUT CONTRAST TECHNIQUE: Contiguous axial images were obtained from the base of the skull through the vertex without intravenous contrast. COMPARISON:  No pertinent prior exams available for comparison. FINDINGS: Brain: Cerebral volume is normal for age. There is no acute intracranial hemorrhage. No demarcated cortical infarct. No extra-axial fluid collection. No evidence of intracranial mass. No midline shift. Partially empty sella turcica. Incidentally noted cavum septum pellucidum. Vascular: No hyperdense vessel.  Atherosclerotic calcifications. Skull: Normal. Negative for fracture or focal lesion. Sinuses/Orbits: Visualized orbits show no acute finding. No significant paranasal sinus disease at the imaged levels. IMPRESSION: No evidence of acute intracranial abnormality. Partially empty sella turcica. This finding is  very commonly incidental, but can be associated with idiopathic intracranial hypertension. Electronically Signed   By: Kellie Simmering DO   On: 01/19/2020 15:10   CT ANGIO NECK W OR WO CONTRAST  Result Date: 01/20/2020 CLINICAL DATA:  Neuro deficit, acute stroke suspected. EXAM: CT ANGIOGRAPHY HEAD AND NECK TECHNIQUE: Multidetector CT imaging of the head and neck was performed using the standard protocol during bolus administration of intravenous contrast. Multiplanar CT image reconstructions and MIPs were obtained to evaluate the vascular  anatomy. Carotid stenosis measurements (when applicable) are obtained utilizing NASCET criteria, using the distal internal carotid diameter as the denominator. CONTRAST:  81mL OMNIPAQUE IOHEXOL 350 MG/ML SOLN COMPARISON:  MRI January 20, 2020. FINDINGS: CT HEAD FINDINGS Brain: Multifocal infarcts were better characterized on recent MRI. Mild associated edema in the right occipital lobe without substantial mass effect. No acute hemorrhage. No hydrocephalus. No mass lesion or abnormal mass effect. Cavum septum pellucidum et vergae, anatomic variant. Vascular: Calcific atherosclerosis of the carotid arteries. Skull: No acute fracture. Sinuses: Visualized sinuses are clear. Orbits: No acute findings. Review of the MIP images confirms the above findings CTA NECK FINDINGS Aortic arch: Great vessel origins are patent. Right carotid system: Rounded focal filling defect within the distal common carotid artery and the proximal internal carotid artery, concerning for intraluminal thrombus. Additional calcific and noncalcific atherosclerosis at the bifurcation involving the proximal internal carotid artery. The degree of underlying stenosis is difficult to quantify given intraluminal thrombus. Left carotid system: No evidence of dissection, stenosis (50% or greater) or occlusion. Mixed calcific and noncalcific atherosclerosis at the bifurcation. Vertebral arteries: Codominant. No evidence of dissection, stenosis (50% or greater) or occlusion. Skeleton: Acute findings. Other neck: No mass or suspicious adenopathy. Upper chest: Partially imaged ground-glass opacities in the lung apices. Scattered calcific granulomas. Review of the MIP images confirms the above findings CTA HEAD FINDINGS Anterior circulation: No significant stenosis, proximal occlusion, aneurysm, or vascular malformation. Predominately calcific atherosclerosis of bilateral cavernous and paraclinoid ICAs. Posterior circulation: No significant stenosis, proximal  occlusion, aneurysm, or vascular malformation. Venous sinuses: As permitted by contrast timing, patent. Review of the MIP images confirms the above findings IMPRESSION: 1. Rounded focal filling defect within the distal right common carotid artery extending into the proximal internal carotid artery, concerning for intraluminal thrombus. 2. No large vessel occlusion. 3. Please see recent MRI for better characterization of known infarcts. No mass effect or acute hemorrhage. 4. Partially imaged ground-glass opacities in the lung apices, which could represent mild pulmonary edema or infection. Critical findings were discussed with Dr. Rory Percy via telephone at 12:25 p.m. Electronically Signed   By: Margaretha Sheffield MD   On: 01/20/2020 12:37   MR BRAIN WO CONTRAST  Result Date: 01/20/2020 CLINICAL DATA:  headache and vision change EXAM: MRI HEAD WITHOUT CONTRAST MRV HEAD WITHOUT AND WITH CONTRAST TECHNIQUE: Multiplanar, multiecho pulse sequences of the brain and surrounding structures were obtained without intravenous contrast. Angiographic images of the intracranial venous structures were obtained using MRV technique without and with intravenous contrast. COMPARISON:  None. FINDINGS: MRI HEAD WITHOUT CONTRAST Brain: There is multifocal abnormal diffusion restriction in the right hemisphere in a watershed distribution. There is associated magnetic susceptibility effect in the right frontal, occipital and posterior parietal lobes . No midline shift or other mass effect. There is mild contrast enhancement associated with the sites. Normal white matter signal, parenchymal volume and CSF spaces. The midline structures are normal. Vascular: Major flow voids are preserved. Skull and upper cervical spine:  Normal calvarium and skull base. Visualized upper cervical spine and soft tissues are normal. Sinuses/Orbits:No paranasal sinus fluid levels or advanced mucosal thickening. No mastoid or middle ear effusion. Normal orbits.  MR VENOGRAM WITHOUT CONTRAST Superior sagittal sinus: Normal. Straight sinus: Normal. Inferior sagittal sinus, vein of Galen and internal cerebral veins: Normal. Transverse sinuses: Normal. Sigmoid sinuses: Normal. Visualized jugular veins: Normal. IMPRESSION: 1. Multifocal acute ischemia in the right hemisphere in a watershed distribution. Associated petechial hemorrhage Heidelberg classification 1b: HI2, confluent petechiae, no mass effect. 2. Contrast enhancement at the sites of abnormal diffusion restriction are likely due to blood brain barrier breakdown in the setting of ischemia. 3. Normal MRV of the head. Electronically Signed   By: Ulyses Jarred M.D.   On: 01/20/2020 00:58   US Carotid Bilateral (at [facility] and [facility] only)  Result Date: 01/20/2020 CLINICAL DATA:  49 year old female with history of stroke. EXAM: BILATERAL CAROTID DUPLEX ULTRASOUND TECHNIQUE: Pearline Cables scale imaging, color Doppler and duplex ultrasound were performed of bilateral carotid and vertebral arteries in the neck. COMPARISON:  None. FINDINGS: Criteria: Quantification of carotid stenosis is based on velocity parameters that correlate the residual internal carotid diameter with NASCET-based stenosis levels, using the diameter of the distal internal carotid lumen as the denominator for stenosis measurement. The following velocity measurements were obtained: RIGHT ICA: Peak systolic velocity 662 cm/sec, End diastolic velocity 34 cm/sec CCA: Peak systolic velocity 947 cm/sec SYSTOLIC ICA/CCA RATIO:  1.8 ECA: Peak systolic velocity 654 cm/sec LEFT ICA: Peak systolic velocity 650 cm/sec, End diastolic velocity 25 cm/sec CCA: 354 cm/sec SYSTOLIC ICA/CCA RATIO:  1.0 ECA: 106 cm/sec RIGHT CAROTID ARTERY: Moderate multifocal atherosclerotic plaque formation. No significant tortuosity. Normal low resistance waveforms. RIGHT VERTEBRAL ARTERY:  Antegrade flow. LEFT CAROTID ARTERY: Mild multifocal atherosclerotic plaque formation. No significant  tortuosity. Normal low resistance waveforms. LEFT VERTEBRAL ARTERY:  Antegrade flow. Upper extremity non-invasive blood pressures: Not obtained. IMPRESSION: 1. Right carotid artery system: 50-69% stenosis secondary to moderate multifocal atherosclerotic plaque formation. 2. Left carotid artery system: Less than 50% stenosis secondary to mild multifocal atherosclerotic plaque formation. 3.  Vertebral artery system: Patent with antegrade flow bilaterally. Ruthann Cancer, MD [facility] Electronically Signed   By: Ruthann Cancer MD   On: 01/20/2020 07:58   US Venous Img Lower Bilateral (DVT)  Result Date: 01/20/2020 CLINICAL DATA:  49 year old with bilateral leg swelling. EXAM: BILATERAL LOWER EXTREMITY VENOUS DOPPLER ULTRASOUND TECHNIQUE: Gray-scale sonography with graded compression, as well as color Doppler and duplex ultrasound were performed to evaluate the lower extremity deep venous systems from the level of the common femoral vein and including the common femoral, femoral, profunda femoral, popliteal and calf veins including the posterior tibial, peroneal and gastrocnemius veins when visible. The superficial great saphenous vein was also interrogated. Spectral Doppler was utilized to evaluate flow at rest and with distal augmentation maneuvers in the common femoral, femoral and popliteal veins. COMPARISON:  None. FINDINGS: RIGHT LOWER EXTREMITY Common Femoral Vein: No evidence of thrombus. Normal compressibility, respiratory phasicity and response to augmentation. Saphenofemoral Junction: No evidence of thrombus. Normal compressibility and flow on color Doppler imaging. Profunda Femoral Vein: No evidence of thrombus. Normal compressibility and flow on color Doppler imaging. Femoral Vein: No evidence of thrombus. Normal compressibility, respiratory phasicity and response to augmentation. Popliteal Vein: No evidence of thrombus. Normal compressibility,  respiratory phasicity and response to augmentation. Calf Veins: Very limited evaluation. Other Findings:  None. LEFT LOWER EXTREMITY Common Femoral Vein: No  evidence of thrombus. Normal compressibility, respiratory phasicity and response to augmentation. Saphenofemoral Junction: No evidence of thrombus. Normal compressibility and flow on color Doppler imaging. Profunda Femoral Vein: No evidence of thrombus. Normal compressibility and flow on color Doppler imaging. Femoral Vein: No evidence of thrombus. Normal compressibility, respiratory phasicity and response to augmentation. Popliteal Vein: No evidence of thrombus. Normal compressibility, respiratory phasicity and response to augmentation. Calf Veins: Very limited evaluation. Other Findings:  None. IMPRESSION: No evidence of deep venous thrombosis in either lower extremity. Deep calf veins could not be evaluated due to body habitus and edema. Electronically Signed   By: Markus Daft M.D.   On: 01/20/2020 12:44   MR MRV HEAD W WO CONTRAST  Result Date: 01/20/2020 CLINICAL DATA:  headache and vision change EXAM: MRI HEAD WITHOUT CONTRAST MRV HEAD WITHOUT AND WITH CONTRAST TECHNIQUE: Multiplanar, multiecho pulse sequences of the brain and surrounding structures were obtained without intravenous contrast. Angiographic images of the intracranial venous structures were obtained using MRV technique without and with intravenous contrast. COMPARISON:  None. FINDINGS: MRI HEAD WITHOUT CONTRAST Brain: There is multifocal abnormal diffusion restriction in the right hemisphere in a watershed distribution. There is associated magnetic susceptibility effect in the right frontal, occipital and posterior parietal lobes . No midline shift or other mass effect. There is mild contrast enhancement associated with the sites. Normal white matter signal, parenchymal volume and CSF spaces. The midline structures are normal. Vascular: Major flow voids are preserved. Skull and upper  cervical spine: Normal calvarium and skull base. Visualized upper cervical spine and soft tissues are normal. Sinuses/Orbits:No paranasal sinus fluid levels or advanced mucosal thickening. No mastoid or middle ear effusion. Normal orbits. MR VENOGRAM WITHOUT CONTRAST Superior sagittal sinus: Normal. Straight sinus: Normal. Inferior sagittal sinus, vein of Galen and internal cerebral veins: Normal. Transverse sinuses: Normal. Sigmoid sinuses: Normal. Visualized jugular veins: Normal. IMPRESSION: 1. Multifocal acute ischemia in the right hemisphere in a watershed distribution. Associated petechial hemorrhage Heidelberg classification 1b: HI2, confluent petechiae, no mass effect. 2. Contrast enhancement at the sites of abnormal diffusion restriction are likely due to blood brain barrier breakdown in the setting of ischemia. 3. Normal MRV of the head. Electronically Signed   By: Ulyses Jarred M.D.   On: 01/20/2020 00:58   ECHOCARDIOGRAM COMPLETE  Result Date: 01/21/2020    ECHOCARDIOGRAM REPORT   Patient Name:   GERALDYN SHAIN Date of Exam: 01/21/2020 Medical Rec #:  [id number]    Height:       65.0 in Accession #:    [id number]   Weight:       341.9 lb Date of Birth:  [birth year]     BSA:          2.485 m Patient Age:    66 years     BP:           152/93 mmHg Patient Gender: F            HR:           74 bpm. Exam Location:  [facility] Procedure: 2D Echo, Color Doppler and Cardiac Doppler Indications:     Stroke 434.91  History:         Patient has prior history of Echocardiogram examinations, most                  recent 11/25/2018.  Sonographer:     Sherrie Sport RDCS ([place]) Referring Phys:  [id number] Athena Masse Diagnosing Phys: Kate Sable MD IMPRESSIONS  1. Left ventricular ejection fraction, by estimation, is 55 to 60%. The left ventricle has normal function. The left ventricle has no regional wall motion abnormalities. Left ventricular diastolic parameters are consistent with Grade I diastolic dysfunction (impaired  relaxation).  2. Right ventricular systolic function is normal. The right ventricular size is normal.  3. The mitral valve is grossly normal. No evidence of mitral valve regurgitation.  4. The aortic valve was not well visualized. Aortic valve regurgitation is not visualized.  5. The inferior vena cava is normal in size with greater than 50% respiratory variability, suggesting right atrial pressure of 3 mmHg. FINDINGS  Left Ventricle: Left ventricular ejection fraction, by estimation, is 55 to 60%. The left ventricle has normal function. The left ventricle has no regional wall motion abnormalities. The left ventricular internal cavity size was normal in size. There is  no left ventricular hypertrophy. Left ventricular diastolic parameters are consistent with Grade I diastolic dysfunction (impaired relaxation). Right Ventricle: The right ventricular size is normal. No increase in right ventricular wall thickness. Right ventricular systolic function is normal. Left Atrium: Left atrial size was normal in size. Right Atrium: Right atrial size was normal in size. Pericardium: There is no evidence of pericardial effusion. Mitral Valve: The mitral valve is grossly normal. No evidence of mitral valve regurgitation. Tricuspid Valve: The tricuspid valve is not well visualized. Tricuspid valve regurgitation is not demonstrated. Aortic Valve: The aortic valve was not well visualized. Aortic valve regurgitation is not visualized. Aortic valve mean gradient measures 5.0 mmHg. Aortic valve peak gradient measures 11.0 mmHg. Aortic valve area, by VTI measures 2.19 cm. Pulmonic Valve: The pulmonic valve was not well visualized. Pulmonic valve regurgitation is not visualized. Aorta: The aortic root was not well visualized. Venous: The inferior vena cava is normal in size with greater than 50% respiratory variability, suggesting right atrial pressure of 3 mmHg. IAS/Shunts: No atrial level shunt detected by color flow Doppler.  LEFT  VENTRICLE PLAX 2D LVIDd:         4.68 cm  Diastology LVIDs:         2.98 cm  LV e' medial:    6.31 cm/s LV PW:         1.27 cm  LV E/e' medial:  9.8 LV IVS:        0.97 cm  LV e' lateral:   5.98 cm/s LVOT diam:     2.00 cm  LV E/e' lateral: 10.4 LV SV:         65 LV SV Index:   26 LVOT Area:     3.14 cm  RIGHT VENTRICLE RV Basal diam:  3.89 cm RV S prime:     14.70 cm/s TAPSE (M-mode): 2.9 cm LEFT ATRIUM             Index       RIGHT ATRIUM           Index LA diam:        3.70 cm 1.49 cm/m  RA Area:     14.30 cm LA Vol (A2C):   39.2 ml 15.78 ml/m RA Volume:   32.50 ml  13.08 ml/m LA Vol (A4C):   56.6 ml 22.78 ml/m LA Biplane Vol: 48.6 ml 19.56 ml/m  AORTIC VALVE                    PULMONIC VALVE AV Area (Vmax):    2.29 cm     PV Vmax:  0.54 m/s AV Area (Vmean):   2.08 cm     PV Peak grad:   1.2 mmHg AV Area (VTI):     2.19 cm     RVOT Peak grad: 2 mmHg AV Vmax:           166.00 cm/s AV Vmean:          105.000 cm/s AV VTI:            0.297 m AV Peak Grad:      11.0 mmHg AV Mean Grad:      5.0 mmHg LVOT Vmax:         121.00 cm/s LVOT Vmean:        69.500 cm/s LVOT VTI:          0.207 m LVOT/AV VTI ratio: 0.70  AORTA Ao Root diam: 2.30 cm MITRAL VALVE               TRICUSPID VALVE MV Area (PHT): 3.06 cm    TR Peak grad:   12.7 mmHg MV Decel Time: 248 msec    TR Vmax:        178.00 cm/s MV E velocity: 62.10 cm/s MV A velocity: 89.20 cm/s  SHUNTS MV E/A ratio:  0.70        Systemic VTI:  0.21 m                            Systemic Diam: 2.00 cm Kate Sable MD Electronically signed by Kate Sable MD Signature Date/Time: 01/21/2020/12:38:06 PM    Final    ECHO TEE  Result Date: 01/21/2020    TRANSESOPHOGEAL ECHO REPORT   Patient Name:   SHEYLA ZAFFINO Date of Exam: 01/21/2020 Medical Rec #:  [id number]    Height:       65.0 in Accession #:    [id number]   Weight:       283.9 lb Date of Birth:  May 10, 1970     BSA:          2.296 m Patient Age:    7 years     BP:           138/67 mmHg Patient  Gender: F            HR:           70 bpm. Exam Location:  [facility] Procedure: Transesophageal Echo, Cardiac Doppler, Color Doppler and Saline            Contrast Bubble Study Indications:     Stroke 434.91  History:         Patient has prior history of Echocardiogram examinations. CAD;                  Risk Factors:Hypertension.  Sonographer:     Sherrie Sport RDCS (AE) Referring Phys:  [id number] Rise Mu Diagnosing Phys: Kate Sable MD PROCEDURE: The transesophogeal probe was passed without difficulty through the esophogus of the patient. Sedation performed by performing physician. The patient developed no complications during the procedure. IMPRESSIONS  1. Left ventricular ejection fraction, by estimation, is 55 to 60%. The left ventricle has normal function.  2. Right ventricular systolic function is normal. The right ventricular size is normal.  3. No left atrial/left atrial appendage thrombus was detected. The LAA emptying velocity was 50 cm/s.  4. The mitral valve is normal in structure. Mild mitral valve regurgitation.  5. The aortic valve is tricuspid.  Aortic valve regurgitation is not visualized.  6. Agitated saline contrast bubble study was negative, with no evidence of any interatrial shunt. Conclusion(s)/Recommendation(s): No LA/LAA thrombus identified. Negative bubble study for interatrial shunt. No intracardiac source of embolism detected on this on this transesophageal echocardiogram. FINDINGS  Left Ventricle: Left ventricular ejection fraction, by estimation, is 55 to 60%. The left ventricle has normal function. The left ventricular internal cavity size was normal in size. Right Ventricle: The right ventricular size is normal. No increase in right ventricular wall thickness. Right ventricular systolic function is normal. Left Atrium: Left atrial size was normal in size. No left atrial/left atrial appendage thrombus was detected. The LAA emptying velocity was 50 cm/s. Right Atrium: Right atrial size  was normal in size. Pericardium: There is no evidence of pericardial effusion. Mitral Valve: The mitral valve is normal in structure. Mild mitral valve regurgitation. Tricuspid Valve: The tricuspid valve is normal in structure. Tricuspid valve regurgitation is not demonstrated. Aortic Valve: The aortic valve is tricuspid. Aortic valve regurgitation is not visualized. Pulmonic Valve: The pulmonic valve was normal in structure. Pulmonic valve regurgitation is not visualized. Aorta: The aortic root is normal in size and structure. IAS/Shunts: No atrial level shunt detected by color flow Doppler. Agitated saline contrast was given intravenously to evaluate for intracardiac shunting. Agitated saline contrast bubble study was negative, with no evidence of any interatrial shunt. Kate Sable MD Electronically signed by Kate Sable MD Signature Date/Time: 01/21/2020/5:02:53 PM    Final      Subjective: Pt c/o fatigue    Discharge Exam: Vitals:   01/22/20 0820 01/22/20 1224  BP: (!) 159/89 (!) 160/82  Pulse: 75 86  Resp: 16 16  Temp: 97.7 F (36.5 C) 98.2 F (36.8 C)  SpO2: 98% 96%   Vitals:   01/22/20 0210 01/22/20 0417 01/22/20 0820 01/22/20 1224  BP:  137/70 (!) 159/89 (!) 160/82  Pulse:  67 75 86  Resp:  19 16 16   Temp:  98.1 F (36.7 C) 97.7 F (36.5 C) 98.2 F (36.8 C)  TempSrc:  Oral Oral Oral  SpO2:  94% 98% 96%  Weight: 129.5 kg     Height:        General: Pt is alert, awake, not in acute distress. Morbidly obese Cardiovascular: S1/S2 +, no rubs, no gallops Respiratory: CTA bilaterally, no wheezing, no rhonchi Abdominal: Soft, NT, obese, bowel sounds + Extremities:  no cyanosis    The results of significant diagnostics from this hospitalization (including imaging, microbiology, ancillary and laboratory) are listed below for reference.     Microbiology: Recent Results (from the past 240 hour(s))  Resp Panel by RT-PCR (Flu A&B, Covid) Nasopharyngeal Swab      Status: None   Collection Time: 01/20/20 11:30 AM   Specimen: Nasopharyngeal Swab; Nasopharyngeal(NP) swabs in vial transport medium  Result Value Ref Range Status   SARS Coronavirus 2 by RT PCR NEGATIVE NEGATIVE Final    Comment: (NOTE) SARS-CoV-2 target nucleic acids are NOT DETECTED.  The SARS-CoV-2 RNA is generally detectable in upper respiratory specimens during the acute phase of infection. The lowest concentration of SARS-CoV-2 viral copies this assay can detect is 138 copies/mL. A negative result does not preclude SARS-Cov-2 infection and should not be used as the sole basis for treatment or other patient management decisions. A negative result may occur with  improper specimen collection/handling, submission of specimen other than nasopharyngeal swab, presence of viral mutation(s) within the areas targeted by this assay, and inadequate  number of viral copies(<138 copies/mL). A negative result must be combined with clinical observations, patient history, and epidemiological information. The expected result is Negative.  Fact Sheet for Patients:  EntrepreneurPulse.com.au  Fact Sheet for Healthcare Providers:  IncredibleEmployment.be  This test is no t yet approved or cleared by the [place] FDA and  has been authorized for detection and/or diagnosis of SARS-CoV-2 by FDA under an Emergency Use Authorization (EUA). This EUA will remain  in effect (meaning this test can be used) for the duration of the COVID-19 declaration under Section 564(b)(1) of the Act, 21 U.S.C.section 360bbb-3(b)(1), unless the authorization is terminated  or revoked sooner.       Influenza A by PCR NEGATIVE NEGATIVE Final   Influenza B by PCR NEGATIVE NEGATIVE Final    Comment: (NOTE) The Xpert Xpress SARS-CoV-2/FLU/RSV plus assay is intended as an aid in the diagnosis of influenza from Nasopharyngeal swab specimens and should not be used as a sole basis  for treatment. Nasal washings and aspirates are unacceptable for Xpert Xpress SARS-CoV-2/FLU/RSV testing.  Fact Sheet for Patients: EntrepreneurPulse.com.au  Fact Sheet for Healthcare Providers: IncredibleEmployment.be  This test is not yet approved or cleared by the [place] FDA and has been authorized for detection and/or diagnosis of SARS-CoV-2 by FDA under an Emergency Use Authorization (EUA). This EUA will remain in effect (meaning this test can be used) for the duration of the COVID-19 declaration under Section 564(b)(1) of the Act, 21 U.S.C. section 360bbb-3(b)(1), unless the authorization is terminated or revoked.  Performed at [facility], [address] [place], [place] [address]   MRSA PCR Screening     Status: None   Collection Time: 01/22/20 10:00 AM   Specimen: Nasopharyngeal  Result Value Ref Range Status   MRSA by PCR NEGATIVE NEGATIVE Final    Comment:        The GeneXpert MRSA Assay (FDA approved for NASAL specimens only), is one component of a comprehensive MRSA colonization surveillance program. It is not intended to diagnose MRSA infection nor to guide or monitor treatment for MRSA infections. Performed at [facility], [address] [place], [place] [address]      Labs: BNP (last 3 results) No results for input(s): BNP in the last 8760 hours. Basic Metabolic Panel: Recent Labs  Lab 01/19/20 1444 01/21/20 0247 01/22/20 0430  NA 135 136 137  K 4.1 4.0 3.8  CL 96* 101 102  CO2 28 27 26   GLUCOSE 301* 253* 240*  BUN 6 10 8   CREATININE 0.71 0.73 0.66  CALCIUM 9.3 8.6* 8.4*   Liver Function Tests: Recent Labs  Lab 01/19/20 1444  AST 25  ALT 26  ALKPHOS 109  BILITOT 0.7  PROT 7.9  ALBUMIN 3.4*   No results for input(s): LIPASE, AMYLASE in the last 168 hours. No results for input(s): AMMONIA in the last 168 hours. CBC: Recent Labs  Lab 01/19/20 1444 01/21/20 0247  01/22/20 0430  WBC 15.0* 9.9 8.5  NEUTROABS 10.4*  --   --   HGB 16.9* 15.9* 15.1*  HCT 50.7* 48.1* 46.7*  MCV 84.5 85.9 86.8  PLT 299 255 237   Cardiac Enzymes: No results for input(s): CKTOTAL, CKMB, CKMBINDEX, TROPONINI in the last 168 hours. BNP: Invalid input(s): POCBNP CBG: Recent Labs  Lab 01/21/20 1423 01/21/20 1638 01/21/20 2109 01/22/20 0824 01/22/20 1223  GLUCAP 152* 205* 251* 250* 276*   D-Dimer No results for input(s): DDIMER in the last 72 hours. Hgb A1c  Recent Labs    01/20/20 0435  HGBA1C 13.7*   Lipid Profile Recent Labs    01/20/20 0435  CHOL 190  HDL 23*  LDLCALC 93  TRIG 372*  CHOLHDL 8.3   Thyroid function studies No results for input(s): TSH, T4TOTAL, T3FREE, THYROIDAB in the last 72 hours.  Invalid input(s): FREET3 Anemia work up No results for input(s): VITAMINB12, FOLATE, FERRITIN, TIBC, IRON, RETICCTPCT in the last 72 hours. Urinalysis No results found for: COLORURINE, APPEARANCEUR, [place], [place], GLUCOSEU, [place], [place], [place], PROTEINUR, UROBILINOGEN, NITRITE, LEUKOCYTESUR Sepsis Labs Invalid input(s): PROCALCITONIN,  WBC,  LACTICIDVEN Microbiology Recent Results (from the past 240 hour(s))  Resp Panel by RT-PCR (Flu A&B, Covid) Nasopharyngeal Swab     Status: None   Collection Time: 01/20/20 11:30 AM   Specimen: Nasopharyngeal Swab; Nasopharyngeal(NP) swabs in vial transport medium  Result Value Ref Range Status   SARS Coronavirus 2 by RT PCR NEGATIVE NEGATIVE Final    Comment: (NOTE) SARS-CoV-2 target nucleic acids are NOT DETECTED.  The SARS-CoV-2 RNA is generally detectable in upper respiratory specimens during the acute phase of infection. The lowest concentration of SARS-CoV-2 viral copies this assay can detect is 138 copies/mL. A negative result does not preclude SARS-Cov-2 infection and should not be used as the sole basis for treatment or other patient management decisions. A negative result may occur  with  improper specimen collection/handling, submission of specimen other than nasopharyngeal swab, presence of viral mutation(s) within the areas targeted by this assay, and inadequate number of viral copies(<138 copies/mL). A negative result must be combined with clinical observations, patient history, and epidemiological information. The expected result is Negative.  Fact Sheet for Patients:  EntrepreneurPulse.com.au  Fact Sheet for Healthcare Providers:  IncredibleEmployment.be  This test is no t yet approved or cleared by the [place] FDA and  has been authorized for detection and/or diagnosis of SARS-CoV-2 by FDA under an Emergency Use Authorization (EUA). This EUA will remain  in effect (meaning this test can be used) for the duration of the COVID-19 declaration under Section 564(b)(1) of the Act, 21 U.S.C.section 360bbb-3(b)(1), unless the authorization is terminated  or revoked sooner.       Influenza A by PCR NEGATIVE NEGATIVE Final   Influenza B by PCR NEGATIVE NEGATIVE Final    Comment: (NOTE) The Xpert Xpress SARS-CoV-2/FLU/RSV plus assay is intended as an aid in the diagnosis of influenza from Nasopharyngeal swab specimens and should not be used as a sole basis for treatment. Nasal washings and aspirates are unacceptable for Xpert Xpress SARS-CoV-2/FLU/RSV testing.  Fact Sheet for Patients: EntrepreneurPulse.com.au  Fact Sheet for Healthcare Providers: IncredibleEmployment.be  This test is not yet approved or cleared by the [place] FDA and has been authorized for detection and/or diagnosis of SARS-CoV-2 by FDA under an Emergency Use Authorization (EUA). This EUA will remain in effect (meaning this test can be used) for the duration of the COVID-19 declaration under Section 564(b)(1) of the Act, 21 U.S.C. section 360bbb-3(b)(1), unless the authorization is terminated  or revoked.  Performed at [facility], [address] [place], [place] [address]   MRSA PCR Screening     Status: None   Collection Time: 01/22/20 10:00 AM   Specimen: Nasopharyngeal  Result Value Ref Range Status   MRSA by PCR NEGATIVE NEGATIVE Final    Comment:        The GeneXpert MRSA Assay (FDA approved for NASAL specimens only), is one component of a comprehensive MRSA colonization surveillance  program. It is not intended to diagnose MRSA infection nor to guide or monitor treatment for MRSA infections. Performed at [facility], [address] [place], [place] [address]      Time coordinating discharge: Over 30 minutes  SIGNED:   Wyvonnia Dusky, MD  [facility] 01/22/2020, 1:20 PM Pager   If 7PM-7AM, please contact night-coverage

## 2020-01-22 NOTE — Consult Note (Signed)
ANTICOAGULATION CONSULT NOTE - Consult  Pharmacy Consult for Heparin gtt Indication: stroke  Allergies  Allergen Reactions   Wellbutrin [Bupropion] Rash    Patient Measurements: Height: 5\' 5"  (165.1 cm) Weight: 129.5 kg (285 lb 8 oz) IBW/kg (Calculated) : 57 Heparin Dosing Weight: 96.4 kg  Vital Signs: Temp: 98.1 F (36.7 C) (12/16 0417) Temp Source: Oral (12/16 0417) BP: 137/70 (12/16 0417) Pulse Rate: 67 (12/16 0417)  Labs: Recent Labs    01/19/20 1444 01/20/20 2000 01/21/20 0247 01/21/20 1154 01/21/20 1950 01/22/20 0430  HGB 16.9*  --  15.9*  --   --  15.1*  HCT 50.7*  --  48.1*  --   --  46.7*  PLT 299  --  255  --   --  237  APTT 28  --   --   --   --   --   LABPROT 12.8  --   --   --   --   --   INR 1.0  --   --   --   --   --   HEPARINUNFRC  --    < > <0.10* <0.10* <0.10* 0.17*  CREATININE 0.71  --  0.73  --   --  0.66   < > = values in this interval not displayed.    Estimated Creatinine Clearance: 112.9 mL/min (by C-G formula based on SCr of 0.66 mg/dL).   Medications:  No AC prior to arrival. Pt on 81mg  daily aspirin. No pertinent drug allergies.  Assessment: 49 year old female presenting with confusion x 3 days. PMH includes DM with neuropathy, HTN, morbid obesity, arthritis, OSA, CAD, and depression. CBC stable.   Per Neuro note: "...persistent symptoms, ED with MRI brain showed multifocal acute ischemia in the Rt hemisphere c/f watershed appearing pattern a/w petechial hemorrhage Heidelberg 1B with no mass-effect.  MRV of the head was done was negative for dural venous sinus thrombosis..."  Heparin CVA/TIA - low-rate; no bolus (reduced anti-Xa goal of 0.3-0.5)  12/14 2000 HL < 0.1  12/15 0247 HL < 0.1 12/15 1154 HL < 0.1 12/15 1950 HL < 0.1 12/16 0430 HL = 0.17   Goal of Therapy:  Heparin level 0.3-0.5 units/ml Monitor platelets by anticoagulation protocol: Yes   Plan:  12/16:  HL @ 0430 = 0.17 Will order Heparin 3000 units IV X 1 bolus  and increase drip rate to 2500 units/hr.  Will order HL 8 hrs after rate change.   Ger Nicks D, PharmD 01/22/2020 5:45 AM

## 2020-01-22 NOTE — Consult Note (Signed)
ANTICOAGULATION CONSULT NOTE - Consult  Pharmacy Consult for Heparin gtt Indication: stroke  Allergies  Allergen Reactions  . Wellbutrin [Bupropion] Rash    Patient Measurements: Height: 5\' 5"  (165.1 cm) Weight: 129.5 kg (285 lb 8 oz) IBW/kg (Calculated) : 57 Heparin Dosing Weight: 96.4 kg  Vital Signs: Temp: 98.1 F (36.7 C) (12/16 0417) Temp Source: Oral (12/16 0417) BP: 137/70 (12/16 0417) Pulse Rate: 67 (12/16 0417)  Labs: Recent Labs    01/19/20 1444 01/20/20 2000 01/21/20 0247 01/21/20 1154 01/21/20 1950 01/22/20 0430  HGB 16.9*  --  15.9*  --   --  15.1*  HCT 50.7*  --  48.1*  --   --  46.7*  PLT 299  --  255  --   --  237  APTT 28  --   --   --   --   --   LABPROT 12.8  --   --   --   --   --   INR 1.0  --   --   --   --   --   HEPARINUNFRC  --    < > <0.10* <0.10* <0.10* 0.17*  CREATININE 0.71  --  0.73  --   --  0.66   < > = values in this interval not displayed.    Estimated Creatinine Clearance: 112.9 mL/min (by C-G formula based on SCr of 0.66 mg/dL).   Medications:  No AC prior to arrival. Pt on 81mg  daily aspirin. No pertinent drug allergies.  Assessment: 49 year old female presenting with confusion x 3 days. PMH includes DM with neuropathy, HTN, morbid obesity, arthritis, OSA, CAD, and depression. CBC stable.   Per Neuro note: "...persistent symptoms, ED with MRI brain showed multifocal acute ischemia in the Rt hemisphere c/f watershed appearing pattern a/w petechial hemorrhage Heidelberg 1B with no mass-effect.  MRV of the head was done was negative for dural venous sinus thrombosis..."  Heparin CVA/TIA - low-rate; no bolus (reduced anti-Xa goal of 0.3-0.5)  12/14 2000 HL < 0.1  12/15 0247 HL < 0.1 12/15 1154 HL < 0.1 12/15 1950 HL < 0.1 12/16 0430 HL = 0.17   Goal of Therapy:  Heparin level 0.3-0.5 units/ml Monitor platelets by anticoagulation protocol: Yes   Plan:  12/16:  HL @ 0430 = 0.17 Will order Heparin 3000 units IV X 1 bolus  and increase drip rate to 2500 units/hr.  Will order HL 8 hrs after rate change.   - previous order was entered in error, 3000 unit X 1 bolus given, rate increased to 2500 units/hr.  Called RN after and had her decrease drip rate to 2400 units/hr.    Kaylana Fenstermacher D, PharmD 01/22/2020 6:06 AM

## 2020-01-22 NOTE — TOC Transition Note (Signed)
Transition of Care [facility]) - CM/SW Discharge Note   Patient Details  Name: Joanna [name] MRN: [id number] Date of Birth: [birth year]  Transition of Care [facility]) CM/SW Contact:  Kerin Salen, RN Phone Number: 01/22/2020, 1:28 PM   Clinical Narrative: TOC spoke with patient at bedside, patient voices ready for discharge. Daughter to provide transportation. Medications to be filled at [facility], no barriers identified. Home Health PT/OT services discussed with patient , who is receptive. No further TOC needs assessed at this time.    Final next level of care: Home/Self Care Barriers to Discharge: Barriers Resolved   Patient Goals and CMS Choice Patient states their goals for this hospitalization and ongoing recovery are:: Return home.   Choice offered to / list presented to : NA  Discharge Placement                Patient to be transferred to facility by: Daughter to provide transportation home.   Patient and family notified of of transfer: 01/22/20  Discharge Plan and Services                DME Arranged: N/A DME Agency: NA       HH Arranged: PT,OT [place] Agency: [place] ([facility]) Date [place]: 01/22/20 Time [place]: [id number] Representative spoke with at [place]: Floydene Flock [phone]  Social Determinants of Health (SDOH) Interventions     Readmission Risk Interventions No flowsheet data found.

## 2020-01-22 NOTE — Progress Notes (Signed)
Patient discharged at this time to home with all belongings. Patient PIV/Tele removed by Probation officer. Patient verbalized understanding of discharge instructions, medications to pick up and follow up appointments. NAD noted upon discharging.

## 2020-01-22 NOTE — Telephone Encounter (Signed)
[employer] representatives notified that patient needs second monitor mailed to her.

## 2020-01-23 LAB — FACTOR 5 LEIDEN

## 2020-01-23 LAB — PROTHROMBIN GENE MUTATION

## 2020-01-26 NOTE — Telephone Encounter (Signed)
Patient scheduled to see Dr Quentin Ore on 02/25/20.

## 2020-02-16 ENCOUNTER — Telehealth: Payer: Self-pay | Admitting: *Deleted

## 2020-02-16 NOTE — Telephone Encounter (Signed)
-----   Message from Rise Mu, PA-C sent at 02/16/2020  1:18 PM EST ----- Please inform patient initial Zio patch showed a predominant rhythm of sinus with an average heart rate of 79 bpm (range 49 to 133 bpm.  1 run of SVT was noted with a maximum rate of 133 bpm.  No evidence of A. fib or flutter.  Continue with planned second Zio patch and EP evaluation.

## 2020-02-16 NOTE — Telephone Encounter (Signed)
The patient has been notified of the result and verbalized understanding.  All questions (if any) were answered. Patient states she has not received the second ZIO monitor yet. Advised I will message the [employer] representative about this as they were supposed to mail her the second one as stated in telephone entry on 01/22/20.

## 2020-02-17 NOTE — Telephone Encounter (Signed)
Patient calling back in to confirm address of:  [place], [place] [address]

## 2020-02-17 NOTE — Telephone Encounter (Signed)
Need to confirm patient's address.  No answer. Left message to call back.    [employer] will expedite a new ZIO to patient once address confirmed. Originally, zio was mailed to patient but [employer] found it was not deliverable and the monitor was sent back to [employer].

## 2020-02-17 NOTE — Telephone Encounter (Signed)
Email sent to Joanna [name] at [facility] with confirmation of address.  They will send out new ZIO today or tomorrow.

## 2020-02-25 ENCOUNTER — Institutional Professional Consult (permissible substitution): Payer: Medicaid Other | Admitting: Cardiology

## 2020-03-12 ENCOUNTER — Telehealth: Payer: Self-pay | Admitting: *Deleted

## 2020-03-12 ENCOUNTER — Institutional Professional Consult (permissible substitution): Payer: Medicaid Other | Admitting: Cardiology

## 2020-03-12 NOTE — Telephone Encounter (Signed)
-----   Message from [facility] sent at 03/10/2020 12:07 PM EST ----- Good morning,  No precert required for the ILR.   Thanks so much,  Estill Bamberg  ----- Message ----- From: Emily Filbert, RN Sent: 03/10/2020  11:44 AM EST To: Emily Filbert, RN, #  Patient is scheduled to see Dr. Quentin Ore in [place] on Friday 03/12/20 for possible loop recorder implant in office. Dx- stroke   Please let me know if she requires authorization or not, so I can notify the [employer] rep to be here.   Thank you! Nira Conn, RN

## 2020-03-15 ENCOUNTER — Encounter: Payer: Self-pay | Admitting: Cardiology

## 2020-03-29 ENCOUNTER — Inpatient Hospital Stay: Payer: Self-pay | Admitting: Neurology

## 2020-04-27 ENCOUNTER — Encounter: Payer: Self-pay | Admitting: Neurology

## 2020-04-27 ENCOUNTER — Inpatient Hospital Stay: Payer: Self-pay | Admitting: Neurology

## 2020-08-19 ENCOUNTER — Emergency Department (HOSPITAL_COMMUNITY): Payer: Medicaid Other

## 2020-08-19 ENCOUNTER — Emergency Department (HOSPITAL_COMMUNITY)
Admission: EM | Admit: 2020-08-19 | Discharge: 2020-08-19 | Disposition: A | Discharge status: Home / Self Care | Payer: Medicaid Other | Attending: Emergency Medicine | Admitting: Emergency Medicine

## 2020-08-19 ENCOUNTER — Other Ambulatory Visit: Payer: Self-pay

## 2020-08-19 ENCOUNTER — Encounter (HOSPITAL_COMMUNITY): Payer: Self-pay

## 2020-08-19 DIAGNOSIS — Z79899 Other long term (current) drug therapy: Secondary | ICD-10-CM | POA: Diagnosis not present

## 2020-08-19 DIAGNOSIS — F17210 Nicotine dependence, cigarettes, uncomplicated: Secondary | ICD-10-CM | POA: Insufficient documentation

## 2020-08-19 DIAGNOSIS — R531 Weakness: Secondary | ICD-10-CM | POA: Insufficient documentation

## 2020-08-19 DIAGNOSIS — Z7901 Long term (current) use of anticoagulants: Secondary | ICD-10-CM | POA: Diagnosis not present

## 2020-08-19 DIAGNOSIS — R202 Paresthesia of skin: Secondary | ICD-10-CM | POA: Insufficient documentation

## 2020-08-19 DIAGNOSIS — E785 Hyperlipidemia, unspecified: Secondary | ICD-10-CM | POA: Diagnosis not present

## 2020-08-19 DIAGNOSIS — E1169 Type 2 diabetes mellitus with other specified complication: Secondary | ICD-10-CM | POA: Diagnosis not present

## 2020-08-19 DIAGNOSIS — Z794 Long term (current) use of insulin: Secondary | ICD-10-CM | POA: Insufficient documentation

## 2020-08-19 DIAGNOSIS — Z7951 Long term (current) use of inhaled steroids: Secondary | ICD-10-CM | POA: Insufficient documentation

## 2020-08-19 DIAGNOSIS — I2510 Atherosclerotic heart disease of native coronary artery without angina pectoris: Secondary | ICD-10-CM | POA: Insufficient documentation

## 2020-08-19 DIAGNOSIS — E1140 Type 2 diabetes mellitus with diabetic neuropathy, unspecified: Secondary | ICD-10-CM | POA: Insufficient documentation

## 2020-08-19 LAB — CBC WITH DIFFERENTIAL/PLATELET
Abs Immature Granulocytes: 0.07 10*3/uL (ref 0.00–0.07)
Basophils Absolute: 0.1 10*3/uL (ref 0.0–0.1)
Basophils Relative: 1 %
Eosinophils Absolute: 0.1 10*3/uL (ref 0.0–0.5)
Eosinophils Relative: 1 %
HCT: 46.3 % — ABNORMAL HIGH (ref 36.0–46.0)
Hemoglobin: 15.2 g/dL — ABNORMAL HIGH (ref 12.0–15.0)
Immature Granulocytes: 1 %
Lymphocytes Relative: 31 %
Lymphs Abs: 3.5 10*3/uL (ref 0.7–4.0)
MCH: 28.5 pg (ref 26.0–34.0)
MCHC: 32.8 g/dL (ref 30.0–36.0)
MCV: 86.7 fL (ref 80.0–100.0)
Monocytes Absolute: 0.6 10*3/uL (ref 0.1–1.0)
Monocytes Relative: 5 %
Neutro Abs: 6.9 10*3/uL (ref 1.7–7.7)
Neutrophils Relative %: 61 %
Platelets: 227 10*3/uL (ref 150–400)
RBC: 5.34 MIL/uL — ABNORMAL HIGH (ref 3.87–5.11)
RDW: 14.3 % (ref 11.5–15.5)
WBC: 11.3 10*3/uL — ABNORMAL HIGH (ref 4.0–10.5)
nRBC: 0 % (ref 0.0–0.2)

## 2020-08-19 LAB — BASIC METABOLIC PANEL
Anion gap: 6 (ref 5–15)
BUN: 8 mg/dL (ref 6–20)
CO2: 29 mmol/L (ref 22–32)
Calcium: 8.5 mg/dL — ABNORMAL LOW (ref 8.9–10.3)
Chloride: 96 mmol/L — ABNORMAL LOW (ref 98–111)
Creatinine, Ser: 0.81 mg/dL (ref 0.44–1.00)
GFR, Estimated: >60 mL/min (ref >60)
Glucose, Bld: 356 mg/dL — ABNORMAL HIGH (ref 70–99)
Potassium: 3.8 mmol/L (ref 3.5–5.1)
Sodium: 131 mmol/L — ABNORMAL LOW (ref 135–145)

## 2020-08-19 NOTE — ED Triage Notes (Signed)
Pt to ED from home with c/o left arm/leg weakness and numbness that occurred 1.5-2 hours ago, lasting for about 2 minutes. Pt had to brace self on right side while waiting for symptoms to resolve. Pt recently had stroke with residual left sided weakness and ran out of her Eliquis about a week ago. Pt ambulatory with cane to triage and able to move all extremities at this time.

## 2020-08-19 NOTE — Discharge Instructions (Addendum)
Call your primary care doctor or specialist as discussed in the next 2-3 days.   Return immediately back to the ER if:  Your symptoms worsen within the next 12-24 hours. You develop new symptoms such as new fevers, persistent vomiting, new pain, shortness of breath, or new weakness or numbness, or if you have any other concerns.  

## 2020-08-19 NOTE — ED Provider Notes (Signed)
[facility] EMERGENCY DEPARTMENT Provider Note   CSN: [id number] Arrival date & time: 08/19/20  1958     History Chief Complaint  Patient presents with   left side weakness    Resolved now    Joanna [name] is a 50 y.o. female.  Patient presents with chief complaint of left arm left leg weakness and numbness recurrent.  She states she had a stroke approximately 7 months ago with resultant left arm and left leg weakness.  Symptoms have improved with rehab and the passage of several months.  However today just prior to arrival she had about 1 or 2 minutes of recurrent left arm and left leg weakness.  Symptoms have since resolved.      Past Medical History:  Diagnosis Date   Anxiety    Dx at age 16   Arthritis    knee   CAD (coronary artery disease)    a. Inferior STEMI 07/2014 - s/p thrombectomy of RCA, no stenting.   Depression    at age 47; bipolar recent   Diabetes mellitus without complication ([place])    Diabetes type 2, uncontrolled ([place]) Dx 2010   DJD (degenerative joint disease)    Gout    Hyperlipidemia Dx 2010   Hypertension    Dx at age 64   IBS (irritable bowel syndrome)    Interstitial lung disease ([place]) Dx 2011   No biopsy   Lung disorder Dx 2011   Morbid obesity (HCC)    Neuropathy    Non compliance w medication regimen    PCOS (polycystic ovarian syndrome) 1988   no children    Sleep apnea    STEMI (ST elevation myocardial infarction) ([place]) 07/23/14   Tobacco abuse     Patient Active Problem List   Diagnosis Date Noted   CVA (cerebral vascular accident) ([place]) 01/21/2020   Type 2 diabetes mellitus with hyperlipidemia ([place]) 01/20/2020   Acute CVA (cerebrovascular accident) ([place]) 01/20/2020   Non-intractable vomiting 10/30/2018   Central abdominal mass 10/30/2018   Constipation 10/30/2018   Colon cancer screening 10/30/2018   Abdominal pain, epigastric 10/30/2018   Chronic pain of right knee 07/11/2016   Cigarette nicotine dependence without  complication 69/67/8938   Sebaceous cyst of breast, right 03/07/2016   OSA (obstructive sleep apnea) 10/26/2014   GERD (gastroesophageal reflux disease) 10/26/2014   Agoraphobia 09/28/2014   Generalized anxiety disorder 09/28/2014   Screening for HIV (human immunodeficiency virus) 09/15/2014   Menometrorrhagia 09/15/2014   PCOS (polycystic ovarian syndrome)    CAD (coronary artery disease) 07/28/2014   Leukocytosis 07/28/2014   Family history of coronary artery disease in father 07/23/2014   ST elevation myocardial infarction (STEMI) involving right coronary artery with complication ([place]) 11/22/5100   Interstitial lung disease ([place])    Uncontrolled diabetes mellitus ([place])    Hypertension    Morbid obesity with BMI of 50.0-59.9, adult ([place])    Non compliance w medication regimen    Depression    Smoker     Past Surgical History:  Procedure Laterality Date   CARDIAC CATHETERIZATION N/A 07/23/2014   Procedure: Left Heart Cath and Coronary Angiography;  Surgeon: Peter M Martinique, MD;  Location: [place] [facility];  Service: Cardiovascular;  Laterality: N/A;   CARDIAC CATHETERIZATION N/A 07/27/2014   Procedure: Left Heart Cath and Coronary Angiography;  Surgeon: Jettie Booze, MD;  Location: [place] [facility];  Service: Cardiovascular;  Laterality: N/A;   CARDIAC CATHETERIZATION N/A 07/27/2014   Procedure: Coronary Balloon  Angioplasty;  Surgeon: Jettie Booze, MD;  Location: [place] [facility];  Service: Cardiovascular;  Laterality: N/A;  thrombectomy only   CORONARY ANGIOPLASTY     TEE WITHOUT CARDIOVERSION N/A 01/21/2020   Procedure: TRANSESOPHAGEAL ECHOCARDIOGRAM (TEE);  Surgeon: Kate Sable, MD;  Location: [facility] ORS;  Service: Cardiovascular;  Laterality: N/A;     OB History   No obstetric history on file.     Family History  Problem Relation Age of Onset   Coronary artery disease Father 24       CABG   Lung cancer Father    Heart disease Father    Diabetes  Father    Hypertension Father    Hyperlipidemia Father    Drug abuse Father    Cancer Father        lung   Lung cancer Mother    Depression Mother    Hypertension Mother    Drug abuse Mother    Cancer Mother        lung cancer   Liver disease Brother        alcoholic cirrhosis >  HCC   COPD Brother    Asthma Brother    Depression Brother    Stroke Brother    Drug abuse Brother    Diabetes Sister    Thyroid disease Sister    Depression Sister    Heart disease Sister    Hypertension Sister    Cirrhosis Maternal Aunt    Cirrhosis Maternal Uncle    Breast cancer Paternal Aunt    Cancer Maternal Grandmother    Emphysema Maternal Grandfather    COPD Paternal Grandmother    Rheumatologic disease Neg Hx    Colon cancer Neg Hx    Colon polyps Neg Hx     Social History   Tobacco Use   Smoking status: Every Day    Packs/day: 1.00    Years: 31.00    Pack years: 31.00    Types: Cigarettes    Start date: 08/04/1983   Smokeless tobacco: Never   Tobacco comments:    about 10-15 cigs daily 09/28/14 - peak 2ppd  Vaping Use   Vaping Use: Never used  Substance Use Topics   Alcohol use: No    Alcohol/week: 0.0 standard drinks   Drug use: No    Home Medications Prior to Admission medications   Medication Sig Start Date End Date Taking? Authorizing Provider  albuterol (VENTOLIN HFA) 108 (90 Base) MCG/ACT inhaler Inhale 1-2 puffs into the lungs every 6 (six) hours as needed for wheezing or shortness of breath. 07/27/09  Yes [provider]  apixaban (ELIQUIS) 5 MG TABS tablet Take 1 tablet (5 mg total) by mouth 2 (two) times daily. Only start this eliquis script after taking eliquis 10mg  BID x 7 days 01/29/20 08/19/20 Yes Wyvonnia Dusky, MD  atorvastatin (LIPITOR) 20 MG tablet Take 20 mg by mouth daily. 09/18/13  Yes [provider]  fluticasone-salmeterol (ADVAIR HFA) 115-21 MCG/ACT inhaler Inhale 2 puffs into the lungs 2 (two) times daily. 11/26/12  Yes  [provider]  gabapentin (NEURONTIN) 300 MG capsule Take 300 mg by mouth 3 (three) times daily. 06/16/20  Yes [provider]  insulin aspart (NOVOLOG) 100 UNIT/ML FlexPen Inject 4 Units into the skin 3 (three) times daily with meals. 01/22/20 08/19/20 Yes Wyvonnia Dusky, MD  insulin detemir (LEVEMIR) 100 UNIT/ML FlexPen Inject 25 Units into the skin daily. 01/22/20 08/19/20 Yes Wyvonnia Dusky, MD  levonorgestrel (  MIRENA) 20 MCG/DAY IUD by Intrauterine route. 08/28/12  Yes [provider]  lisinopril-hydrochlorothiazide (ZESTORETIC) 20-12.5 MG tablet Take 1 tablet by mouth daily. 06/16/20  Yes [provider]  traZODone (DESYREL) 50 MG tablet Take 50-100 mg by mouth at bedtime. 06/16/20  Yes [provider]  venlafaxine XR (EFFEXOR-XR) 37.5 MG 24 hr capsule Take 37.5 mg by mouth daily with breakfast. 06/16/20  Yes [provider]  apixaban (ELIQUIS) 5 MG TABS tablet Take 2 tablets (10 mg total) by mouth 2 (two) times daily for 7 days. Patient not taking: No sig reported 01/22/20 08/19/20  Wyvonnia Dusky, MD  atorvastatin (LIPITOR) 80 MG tablet Take 1 tablet (80 mg total) by mouth daily. Patient not taking: Reported on 08/19/2020 01/23/20 02/22/20  Wyvonnia Dusky, MD  Insulin Pen Needle (PEN NEEDLES) 32G X 5 MM MISC Enough pen needles for levermir 25 units daily & novolog 4 units TID w/ meals x 30 days. No refills 01/22/20   Wyvonnia Dusky, MD    Allergies    Wellbutrin [bupropion]  Review of Systems   Review of Systems  Constitutional:  Negative for fever.  HENT:  Negative for ear pain.   Eyes:  Negative for pain.  Respiratory:  Negative for cough.   Cardiovascular:  Negative for chest pain.  Gastrointestinal:  Negative for abdominal pain.  Genitourinary:  Negative for flank pain.  Musculoskeletal:  Negative for back pain.  Skin:  Negative for rash.  Neurological:  Negative for headaches.   Physical Exam Updated  Vital Signs BP 136/84   Pulse 78   Temp 98.2 F (36.8 C) (Oral)   Resp (!) 21   Ht 5\' 5"  (1.651 m)   Wt 125.2 kg   SpO2 93%   BMI 45.93 kg/m   Physical Exam Constitutional:      General: She is not in acute distress.    Appearance: Normal appearance.  HENT:     Head: Normocephalic.     Nose: Nose normal.  Eyes:     Extraocular Movements: Extraocular movements intact.  Cardiovascular:     Rate and Rhythm: Normal rate.  Pulmonary:     Effort: Pulmonary effort is normal.  Musculoskeletal:        General: Normal range of motion.     Cervical back: Normal range of motion.  Neurological:     General: No focal deficit present.     Mental Status: She is alert. Mental status is at baseline.     Comments: Cranial nerves II through XII intact.  Strength is 5/5 strength bilateral upper and lower extremities.    ED Results / Procedures / Treatments   Labs (all labs ordered are listed, but only abnormal results are displayed) Labs Reviewed  CBC WITH DIFFERENTIAL/PLATELET - Abnormal; Notable for the following components:      Result Value   WBC 11.3 (*)    RBC 5.34 (*)    Hemoglobin 15.2 (*)    HCT 46.3 (*)    All other components within normal limits  BASIC METABOLIC PANEL - Abnormal; Notable for the following components:   Sodium 131 (*)    Chloride 96 (*)    Glucose, Bld 356 (*)    Calcium 8.5 (*)    All other components within normal limits    EKG EKG Interpretation  Date/Time:  Thursday August 19 2020 20:29:39 EDT Ventricular Rate:  75 PR Interval:  156 QRS Duration: 84 QT Interval:  383 QTC Calculation: 428  R Axis:   35 Text Interpretation: Sinus rhythm Low voltage, precordial leads Confirmed by Thamas Jaegers ([id number]) on 08/19/2020 9:53:35 PM  Radiology CT Head Wo Contrast  Result Date: 08/19/2020 CLINICAL DATA:  Left-sided weakness, initial encounter EXAM: CT HEAD WITHOUT CONTRAST TECHNIQUE: Contiguous axial images were obtained from the base of the skull through  the vertex without intravenous contrast. COMPARISON:  01/22/2020 FINDINGS: Brain: No evidence of acute infarction, hemorrhage, hydrocephalus, extra-axial collection or mass lesion/mass effect. Cavum septum pellucidum is again noted. Vascular: No hyperdense vessel or unexpected calcification. Skull: Normal. Negative for fracture or focal lesion. Sinuses/Orbits: No acute finding. Other: None. IMPRESSION: No acute intracranial abnormality noted. No significant interval change from the prior exam. Electronically Signed   By: Inez Catalina M.D.   On: 08/19/2020 22:10    Procedures Procedures   Medications Ordered in ED Medications - No data to display  ED Course  I have reviewed the triage vital signs and the nursing notes.  Pertinent labs & imaging results that were available during my care of the patient were reviewed by me and considered in my medical decision making (see chart for details).    MDM Rules/Calculators/A&P                          Work-up is unremarkable CT head shows no acute findings.  Labs are unremarkable.  Patient states that she remains asymptomatic at this time and that her prior symptoms have not returned.  I discussed at length with patient regarding possible TIA or small recurrent stroke.  Patient continues on Eliquis however.  I discussed with patient the benefits of MRI imaging.  Patient will need to be transferred however to outside hospital for further evaluation with MRI, she declined.  Risk and benefits discussed.  Will advise outpatient follow-up with her doctor within the week.  Advising immediate return for recurrent weakness new symptoms or any additional concerns.  Final Clinical Impression(s) / ED Diagnoses Final diagnoses:  Episode of generalized weakness    Rx / DC Orders ED Discharge Orders     None        Luna Fuse, MD 08/19/20 2239

## 2020-09-14 ENCOUNTER — Ambulatory Visit
Admission: EM | Admit: 2020-09-14 | Discharge: 2020-09-14 | Disposition: A | Discharge status: Home / Self Care | Payer: Medicaid Other | Attending: Family Medicine | Admitting: Family Medicine

## 2020-09-14 ENCOUNTER — Encounter: Payer: Self-pay | Admitting: Emergency Medicine

## 2020-09-14 DIAGNOSIS — Z1152 Encounter for screening for COVID-19: Secondary | ICD-10-CM

## 2020-09-14 NOTE — ED Triage Notes (Signed)
Pt presents today requesting to be tested for Covid. She reports being exposed to Covid by family member. Only c/o headache, declines to be seen by provider.

## 2020-09-16 LAB — COVID-19, FLU A+B NAA
Influenza A, NAA: NOT DETECTED
Influenza B, NAA: NOT DETECTED
SARS-CoV-2, NAA: NOT DETECTED

## 2020-10-12 ENCOUNTER — Observation Stay (HOSPITAL_COMMUNITY): Payer: Medicaid Other

## 2020-10-12 ENCOUNTER — Emergency Department (HOSPITAL_COMMUNITY): Payer: Medicaid Other

## 2020-10-12 ENCOUNTER — Encounter (HOSPITAL_COMMUNITY): Payer: Self-pay | Admitting: Emergency Medicine

## 2020-10-12 ENCOUNTER — Other Ambulatory Visit: Payer: Self-pay

## 2020-10-12 ENCOUNTER — Observation Stay (HOSPITAL_BASED_OUTPATIENT_CLINIC_OR_DEPARTMENT_OTHER): Payer: Medicaid Other

## 2020-10-12 ENCOUNTER — Observation Stay (HOSPITAL_COMMUNITY)
Admission: EM | Admit: 2020-10-12 | Discharge: 2020-10-13 | Disposition: A | Discharge status: Home / Self Care | Payer: Medicaid Other | Attending: Internal Medicine | Admitting: Internal Medicine

## 2020-10-12 DIAGNOSIS — U071 COVID-19: Secondary | ICD-10-CM | POA: Diagnosis not present

## 2020-10-12 DIAGNOSIS — F17210 Nicotine dependence, cigarettes, uncomplicated: Secondary | ICD-10-CM | POA: Diagnosis present

## 2020-10-12 DIAGNOSIS — I2510 Atherosclerotic heart disease of native coronary artery without angina pectoris: Secondary | ICD-10-CM | POA: Diagnosis not present

## 2020-10-12 DIAGNOSIS — F32A Depression, unspecified: Secondary | ICD-10-CM | POA: Diagnosis not present

## 2020-10-12 DIAGNOSIS — Z7901 Long term (current) use of anticoagulants: Secondary | ICD-10-CM | POA: Diagnosis not present

## 2020-10-12 DIAGNOSIS — I10 Essential (primary) hypertension: Secondary | ICD-10-CM | POA: Diagnosis not present

## 2020-10-12 DIAGNOSIS — K219 Gastro-esophageal reflux disease without esophagitis: Secondary | ICD-10-CM | POA: Diagnosis present

## 2020-10-12 DIAGNOSIS — F17200 Nicotine dependence, unspecified, uncomplicated: Secondary | ICD-10-CM | POA: Diagnosis not present

## 2020-10-12 DIAGNOSIS — F419 Anxiety disorder, unspecified: Secondary | ICD-10-CM | POA: Diagnosis not present

## 2020-10-12 DIAGNOSIS — G4733 Obstructive sleep apnea (adult) (pediatric): Secondary | ICD-10-CM

## 2020-10-12 DIAGNOSIS — Z91148 Patient's other noncompliance with medication regimen for other reason: Secondary | ICD-10-CM

## 2020-10-12 DIAGNOSIS — Y900 Blood alcohol level of less than 20 mg/100 ml: Secondary | ICD-10-CM | POA: Insufficient documentation

## 2020-10-12 DIAGNOSIS — I25118 Atherosclerotic heart disease of native coronary artery with other forms of angina pectoris: Secondary | ICD-10-CM

## 2020-10-12 DIAGNOSIS — Z79899 Other long term (current) drug therapy: Secondary | ICD-10-CM | POA: Diagnosis not present

## 2020-10-12 DIAGNOSIS — E1169 Type 2 diabetes mellitus with other specified complication: Secondary | ICD-10-CM | POA: Diagnosis present

## 2020-10-12 DIAGNOSIS — E119 Type 2 diabetes mellitus without complications: Secondary | ICD-10-CM | POA: Diagnosis not present

## 2020-10-12 DIAGNOSIS — Z794 Long term (current) use of insulin: Secondary | ICD-10-CM | POA: Diagnosis not present

## 2020-10-12 DIAGNOSIS — Z9114 Patient's other noncompliance with medication regimen: Secondary | ICD-10-CM

## 2020-10-12 DIAGNOSIS — R531 Weakness: Secondary | ICD-10-CM | POA: Diagnosis present

## 2020-10-12 DIAGNOSIS — E785 Hyperlipidemia, unspecified: Secondary | ICD-10-CM | POA: Diagnosis present

## 2020-10-12 DIAGNOSIS — Z8249 Family history of ischemic heart disease and other diseases of the circulatory system: Secondary | ICD-10-CM

## 2020-10-12 DIAGNOSIS — J849 Interstitial pulmonary disease, unspecified: Secondary | ICD-10-CM | POA: Diagnosis present

## 2020-10-12 DIAGNOSIS — F411 Generalized anxiety disorder: Secondary | ICD-10-CM | POA: Diagnosis present

## 2020-10-12 DIAGNOSIS — I6389 Other cerebral infarction: Secondary | ICD-10-CM | POA: Diagnosis not present

## 2020-10-12 DIAGNOSIS — I639 Cerebral infarction, unspecified: Secondary | ICD-10-CM | POA: Diagnosis not present

## 2020-10-12 DIAGNOSIS — F4000 Agoraphobia, unspecified: Secondary | ICD-10-CM | POA: Diagnosis present

## 2020-10-12 HISTORY — DX: Cerebral infarction, unspecified: I63.9

## 2020-10-12 LAB — GLUCOSE, CAPILLARY
Glucose-Capillary: 178 mg/dL — ABNORMAL HIGH (ref 70–99)
Glucose-Capillary: 234 mg/dL — ABNORMAL HIGH (ref 70–99)
Glucose-Capillary: 135 mg/dL — ABNORMAL HIGH (ref 70–99)
Glucose-Capillary: 187 mg/dL — ABNORMAL HIGH (ref 70–99)

## 2020-10-12 LAB — COMPREHENSIVE METABOLIC PANEL
ALT: 27 U/L (ref 0–44)
AST: 23 U/L (ref 15–41)
Albumin: 3.4 g/dL — ABNORMAL LOW (ref 3.5–5.0)
Alkaline Phosphatase: 95 U/L (ref 38–126)
Anion gap: 4 — ABNORMAL LOW (ref 5–15)
BUN: 9 mg/dL (ref 6–20)
CO2: 28 mmol/L (ref 22–32)
Calcium: 8.4 mg/dL — ABNORMAL LOW (ref 8.9–10.3)
Chloride: 100 mmol/L (ref 98–111)
Creatinine, Ser: 0.69 mg/dL (ref 0.44–1.00)
GFR, Estimated: >60 mL/min (ref >60)
Glucose, Bld: 262 mg/dL — ABNORMAL HIGH (ref 70–99)
Potassium: 3.4 mmol/L — ABNORMAL LOW (ref 3.5–5.1)
Sodium: 132 mmol/L — ABNORMAL LOW (ref 135–145)
Total Bilirubin: 0.7 mg/dL (ref 0.3–1.2)
Total Protein: 7.1 g/dL (ref 6.5–8.1)

## 2020-10-12 LAB — DIFFERENTIAL
Abs Immature Granulocytes: 0.04 10*3/uL (ref 0.00–0.07)
Basophils Absolute: 0.1 10*3/uL (ref 0.0–0.1)
Basophils Relative: 1 %
Eosinophils Absolute: 0.1 10*3/uL (ref 0.0–0.5)
Eosinophils Relative: 1 %
Immature Granulocytes: 0 %
Lymphocytes Relative: 31 %
Lymphs Abs: 2.8 10*3/uL (ref 0.7–4.0)
Monocytes Absolute: 0.6 10*3/uL (ref 0.1–1.0)
Monocytes Relative: 7 %
Neutro Abs: 5.6 10*3/uL (ref 1.7–7.7)
Neutrophils Relative %: 60 %

## 2020-10-12 LAB — RESP PANEL BY RT-PCR (FLU A&B, COVID) ARPGX2
Influenza A by PCR: NEGATIVE
Influenza B by PCR: NEGATIVE
SARS Coronavirus 2 by RT PCR: POSITIVE — AB

## 2020-10-12 LAB — I-STAT CHEM 8, ED
BUN: 9 mg/dL (ref 6–20)
Calcium, Ion: 1.12 mmol/L — ABNORMAL LOW (ref 1.15–1.40)
Chloride: 97 mmol/L — ABNORMAL LOW (ref 98–111)
Creatinine, Ser: 0.6 mg/dL (ref 0.44–1.00)
Glucose, Bld: 265 mg/dL — ABNORMAL HIGH (ref 70–99)
HCT: 47 % — ABNORMAL HIGH (ref 36.0–46.0)
Hemoglobin: 16 g/dL — ABNORMAL HIGH (ref 12.0–15.0)
Potassium: 3.7 mmol/L (ref 3.5–5.1)
Sodium: 136 mmol/L (ref 135–145)
TCO2: 30 mmol/L (ref 22–32)

## 2020-10-12 LAB — I-STAT BETA HCG BLOOD, ED (MC, WL, AP ONLY): I-stat hCG, quantitative: <5 m[IU]/mL (ref <5)

## 2020-10-12 LAB — LIPID PANEL
Cholesterol: 137 mg/dL (ref 0–200)
HDL: 21 mg/dL — ABNORMAL LOW (ref >40)
LDL Cholesterol: 54 mg/dL (ref 0–99)
Total CHOL/HDL Ratio: 6.5 RATIO
Triglycerides: 308 mg/dL — ABNORMAL HIGH (ref <150)
VLDL: 62 mg/dL — ABNORMAL HIGH (ref 0–40)

## 2020-10-12 LAB — PROTIME-INR
INR: 1 (ref 0.8–1.2)
Prothrombin Time: 13.5 seconds (ref 11.4–15.2)

## 2020-10-12 LAB — CBC
HCT: 47.4 % — ABNORMAL HIGH (ref 36.0–46.0)
Hemoglobin: 15.9 g/dL — ABNORMAL HIGH (ref 12.0–15.0)
MCH: 28.9 pg (ref 26.0–34.0)
MCHC: 33.5 g/dL (ref 30.0–36.0)
MCV: 86.2 fL (ref 80.0–100.0)
Platelets: 190 10*3/uL (ref 150–400)
RBC: 5.5 MIL/uL — ABNORMAL HIGH (ref 3.87–5.11)
RDW: 13.1 % (ref 11.5–15.5)
WBC: 9.3 10*3/uL (ref 4.0–10.5)
nRBC: 0 % (ref 0.0–0.2)

## 2020-10-12 LAB — CREATININE, SERUM
Creatinine, Ser: 0.61 mg/dL (ref 0.44–1.00)
GFR, Estimated: >60 mL/min (ref >60)

## 2020-10-12 LAB — ECHOCARDIOGRAM COMPLETE
Area-P 1/2: 3.4 cm2
Height: 65 in
S' Lateral: 3.23 cm
Weight: 4318.4 oz

## 2020-10-12 LAB — HEMOGLOBIN A1C
Hgb A1c MFr Bld: 12.7 % — ABNORMAL HIGH (ref 4.8–5.6)
Mean Plasma Glucose: 317.79 mg/dL

## 2020-10-12 LAB — ETHANOL: Alcohol, Ethyl (B): <10 mg/dL (ref <10)

## 2020-10-12 LAB — APTT: aPTT: 28 seconds (ref 24–36)

## 2020-10-12 MED ORDER — GADOBUTROL 1 MMOL/ML IV SOLN
10.0000 mL | Freq: Once | INTRAVENOUS | Status: AC | PRN
  Administered 2020-10-12: 10 mL via INTRAVENOUS

## 2020-10-12 MED ORDER — GABAPENTIN 300 MG PO CAPS
300.0000 mg | ORAL_CAPSULE | Freq: Three times a day (TID) | ORAL | Status: DC
  Administered 2020-10-12 – 2020-10-13 (×4): 300 mg via ORAL
  Filled 2020-10-12 (×4): qty 1

## 2020-10-12 MED ORDER — PERFLUTREN LIPID MICROSPHERE
1.0000 mL | INTRAVENOUS | Status: AC | PRN
  Administered 2020-10-12: 2 mL via INTRAVENOUS
  Filled 2020-10-12: qty 10

## 2020-10-12 MED ORDER — MOMETASONE FURO-FORMOTEROL FUM 200-5 MCG/ACT IN AERO
2.0000 | INHALATION_SPRAY | Freq: Two times a day (BID) | RESPIRATORY_TRACT | Status: DC
  Administered 2020-10-12 – 2020-10-13 (×3): 2 via RESPIRATORY_TRACT
  Filled 2020-10-12: qty 8.8

## 2020-10-12 MED ORDER — TRAZODONE HCL 50 MG PO TABS
50.0000 mg | ORAL_TABLET | Freq: Every day | ORAL | Status: DC
  Administered 2020-10-12: 100 mg via ORAL
  Filled 2020-10-12: qty 2

## 2020-10-12 MED ORDER — INSULIN ASPART 100 UNIT/ML IJ SOLN: 0.0000 [IU] | Freq: Every day | INTRAMUSCULAR | Status: DC

## 2020-10-12 MED ORDER — ACETAMINOPHEN 325 MG PO TABS: 650.0000 mg | ORAL_TABLET | ORAL | Status: DC | PRN

## 2020-10-12 MED ORDER — INSULIN ASPART 100 UNIT/ML IJ SOLN
0.0000 [IU] | Freq: Three times a day (TID) | INTRAMUSCULAR | Status: DC
  Administered 2020-10-12: 3 [IU] via SUBCUTANEOUS
  Administered 2020-10-12: 5 [IU] via SUBCUTANEOUS
  Administered 2020-10-12: 2 [IU] via SUBCUTANEOUS
  Administered 2020-10-13 (×2): 5 [IU] via SUBCUTANEOUS

## 2020-10-12 MED ORDER — ATORVASTATIN CALCIUM 20 MG PO TABS
20.0000 mg | ORAL_TABLET | Freq: Every day | ORAL | Status: DC
  Administered 2020-10-12 – 2020-10-13 (×2): 20 mg via ORAL
  Filled 2020-10-12 (×2): qty 1

## 2020-10-12 MED ORDER — HEPARIN SODIUM (PORCINE) 5000 UNIT/ML IJ SOLN
5000.0000 [IU] | Freq: Three times a day (TID) | INTRAMUSCULAR | Status: DC
  Administered 2020-10-12 – 2020-10-13 (×5): 5000 [IU] via SUBCUTANEOUS
  Filled 2020-10-12 (×5): qty 1

## 2020-10-12 MED ORDER — BEBTELOVIMAB 175 MG/2 ML IV (EUA)
175.0000 mg | Freq: Once | INTRAMUSCULAR | Status: AC
  Administered 2020-10-12: 175 mg via INTRAVENOUS
  Filled 2020-10-12: qty 2

## 2020-10-12 MED ORDER — SODIUM CHLORIDE 0.9 % IV SOLN: INTRAVENOUS | Status: AC | PRN

## 2020-10-12 MED ORDER — ASPIRIN 81 MG PO CHEW
81.0000 mg | CHEWABLE_TABLET | Freq: Once | ORAL | Status: AC
  Administered 2020-10-12: 81 mg via ORAL
  Filled 2020-10-12: qty 1

## 2020-10-12 MED ORDER — CLOPIDOGREL BISULFATE 75 MG PO TABS
75.0000 mg | ORAL_TABLET | Freq: Every day | ORAL | Status: DC
  Administered 2020-10-12 – 2020-10-13 (×2): 75 mg via ORAL
  Filled 2020-10-12 (×2): qty 1

## 2020-10-12 MED ORDER — ALBUTEROL SULFATE HFA 108 (90 BASE) MCG/ACT IN AERS: 2.0000 | INHALATION_SPRAY | Freq: Once | RESPIRATORY_TRACT | Status: AC | PRN

## 2020-10-12 MED ORDER — DIPHENHYDRAMINE HCL 50 MG/ML IJ SOLN: 50.0000 mg | Freq: Once | INTRAMUSCULAR | Status: AC | PRN

## 2020-10-12 MED ORDER — SENNOSIDES-DOCUSATE SODIUM 8.6-50 MG PO TABS
1.0000 | ORAL_TABLET | Freq: Every evening | ORAL | Status: DC | PRN
Start: 2020-10-12 — End: 2020-10-13

## 2020-10-12 MED ORDER — ACETAMINOPHEN 160 MG/5ML PO SOLN: 650.0000 mg | ORAL | Status: DC | PRN

## 2020-10-12 MED ORDER — ACETAMINOPHEN 650 MG RE SUPP: 650.0000 mg | RECTAL | Status: DC | PRN

## 2020-10-12 MED ORDER — PROCHLORPERAZINE EDISYLATE 10 MG/2ML IJ SOLN
10.0000 mg | Freq: Once | INTRAMUSCULAR | Status: AC
  Administered 2020-10-12: 10 mg via INTRAVENOUS
  Filled 2020-10-12: qty 2

## 2020-10-12 MED ORDER — EPINEPHRINE 0.3 MG/0.3ML IJ SOAJ: 0.3000 mg | Freq: Once | INTRAMUSCULAR | Status: AC | PRN

## 2020-10-12 MED ORDER — INSULIN ASPART 100 UNIT/ML IJ SOLN
6.0000 [IU] | Freq: Three times a day (TID) | INTRAMUSCULAR | Status: DC
  Administered 2020-10-12 – 2020-10-13 (×5): 6 [IU] via SUBCUTANEOUS

## 2020-10-12 MED ORDER — CLOPIDOGREL BISULFATE 75 MG PO TABS
300.0000 mg | ORAL_TABLET | Freq: Once | ORAL | Status: AC
  Administered 2020-10-12: 300 mg via ORAL
  Filled 2020-10-12: qty 4

## 2020-10-12 MED ORDER — DIPHENHYDRAMINE HCL 50 MG/ML IJ SOLN
25.0000 mg | Freq: Once | INTRAMUSCULAR | Status: AC
  Administered 2020-10-12: 25 mg via INTRAVENOUS
  Filled 2020-10-12: qty 1

## 2020-10-12 MED ORDER — ASPIRIN EC 81 MG PO TBEC
81.0000 mg | DELAYED_RELEASE_TABLET | Freq: Every day | ORAL | Status: DC
  Administered 2020-10-12 – 2020-10-13 (×2): 81 mg via ORAL
  Filled 2020-10-12 (×2): qty 1

## 2020-10-12 MED ORDER — FAMOTIDINE IN NACL 20-0.9 MG/50ML-% IV SOLN: 20.0000 mg | Freq: Once | INTRAVENOUS | Status: AC | PRN

## 2020-10-12 MED ORDER — STROKE: EARLY STAGES OF RECOVERY BOOK: Freq: Once | Status: DC

## 2020-10-12 MED ORDER — POTASSIUM CHLORIDE 20 MEQ PO PACK
40.0000 meq | PACK | Freq: Once | ORAL | Status: AC
  Administered 2020-10-12: 40 meq via ORAL
  Filled 2020-10-12: qty 2

## 2020-10-12 MED ORDER — ALBUTEROL SULFATE HFA 108 (90 BASE) MCG/ACT IN AERS: 1.0000 | INHALATION_SPRAY | Freq: Four times a day (QID) | RESPIRATORY_TRACT | Status: DC | PRN

## 2020-10-12 MED ORDER — IOHEXOL 350 MG/ML SOLN
75.0000 mL | Freq: Once | INTRAVENOUS | Status: AC | PRN
  Administered 2020-10-12: 75 mL via INTRAVENOUS

## 2020-10-12 MED ORDER — METHYLPREDNISOLONE SODIUM SUCC 125 MG IJ SOLR: 125.0000 mg | Freq: Once | INTRAMUSCULAR | Status: AC | PRN

## 2020-10-12 MED ORDER — LABETALOL HCL 5 MG/ML IV SOLN: 10.0000 mg | INTRAVENOUS | Status: DC | PRN

## 2020-10-12 MED ORDER — INSULIN DETEMIR 100 UNIT/ML ~~LOC~~ SOLN
25.0000 [IU] | Freq: Every day | SUBCUTANEOUS | Status: DC
  Administered 2020-10-12 – 2020-10-13 (×2): 25 [IU] via SUBCUTANEOUS
  Filled 2020-10-12 (×3): qty 0.25

## 2020-10-12 NOTE — Progress Notes (Signed)
SLP Cancellation Note  Patient Details Name: Joanna [name] MRN: [id number] DOB: [birth year]   Cancelled treatment:       Reason Eval/Treat Not Completed: SLP screened, no needs identified, will sign off. Pt's cognition, speech and language functioning are at baseline. Thank you for this referral,  Telly Broberg H. Roddie Mc, [place] Speech Language Pathologist    Wende Bushy 10/12/2020, 10:40 AM

## 2020-10-12 NOTE — Progress Notes (Signed)
  Echocardiogram 2D Echocardiogram has been performed.  Joanna [name] 10/12/2020, 10:30 AM

## 2020-10-12 NOTE — ED Provider Notes (Signed)
[facility] Emergency Department Provider Note MRN:  [id number]  Arrival date & time: 10/12/20     Chief Complaint     History of Present Illness   Joanna [name] is a 50 y.o. year-old female with a history of hypertension, diabetes, CAD, stroke presenting to the ED with chief complaint of weakness.  Patient seen normal by family at midnight, about 20 minutes prior to arrival here began experiencing a sudden onset left-sided headache followed by left-sided weakness to the arm and leg.  Has some very mild residual deficits to the side from prior stroke, but she says her weakness is now much worse denies any visual disturbance, no speech change, no chest pain.  Review of Systems  Positive for neurological deficits, headache.  Patient's Health History    Past Medical History:  Diagnosis Date   Anxiety    Dx at age 17   Arthritis    knee   CAD (coronary artery disease)    a. Inferior STEMI 07/2014 - s/p thrombectomy of RCA, no stenting.   Depression    at age 45; bipolar recent   Diabetes mellitus without complication ([place])    Diabetes type 2, uncontrolled ([place]) Dx 2010   DJD (degenerative joint disease)    Gout    Hyperlipidemia Dx 2010   Hypertension    Dx at age 69   IBS (irritable bowel syndrome)    Interstitial lung disease ([place]) Dx 2011   No biopsy   Lung disorder Dx 2011   Morbid obesity (HCC)    Neuropathy    Non compliance w medication regimen    PCOS (polycystic ovarian syndrome) 1988   no children    Sleep apnea    STEMI (ST elevation myocardial infarction) ([place]) 07/23/2014   Stroke ([place])    Tobacco abuse     Past Surgical History:  Procedure Laterality Date   CARDIAC CATHETERIZATION N/A 07/23/2014   Procedure: Left Heart Cath and Coronary Angiography;  Surgeon: Peter M Martinique, MD;  Location: [place] [facility];  Service: Cardiovascular;  Laterality: N/A;   CARDIAC CATHETERIZATION N/A 07/27/2014   Procedure: Left Heart Cath and  Coronary Angiography;  Surgeon: Jettie Booze, MD;  Location: [place] [facility];  Service: Cardiovascular;  Laterality: N/A;   CARDIAC CATHETERIZATION N/A 07/27/2014   Procedure: Coronary Balloon Angioplasty;  Surgeon: Jettie Booze, MD;  Location: [place] [facility];  Service: Cardiovascular;  Laterality: N/A;  thrombectomy only   CORONARY ANGIOPLASTY     TEE WITHOUT CARDIOVERSION N/A 01/21/2020   Procedure: TRANSESOPHAGEAL ECHOCARDIOGRAM (TEE);  Surgeon: Kate Sable, MD;  Location: [facility] OR[facility];  Service: Cardiovascular;  Laterality: N/A;    Family History  Problem Relation Age of Onset   Coronary artery disease Father 17       CABG   Lung cancer Father    Heart disease Father    Diabetes Father    Hypertension Father    Hyperlipidemia Father    Drug abuse Father    Cancer Father        lung   Lung cancer Mother    Depression Mother    Hypertension Mother    Drug abuse Mother    Cancer Mother        lung cancer   Liver disease Brother        alcoholic cirrhosis >  HCC   COPD Brother    Asthma Brother    Depression Brother    Stroke Brother  Drug abuse Brother    Diabetes Sister    Thyroid disease Sister    Depression Sister    Heart disease Sister    Hypertension Sister    Cirrhosis Maternal Aunt    Cirrhosis Maternal Uncle    Breast cancer Paternal Aunt    Cancer Maternal Grandmother    Emphysema Maternal Grandfather    COPD Paternal Grandmother    Rheumatologic disease Neg Hx    Colon cancer Neg Hx    Colon polyps Neg Hx     Social History   Socioeconomic History   Marital status: Single    Spouse name: Not on file   Number of children: 0   Years of education: Not on file   Highest education level: Not on file  Occupational History   Occupation: unemployed  Tobacco Use   Smoking status: Every Day    Packs/day: 1.00    Years: 31.00    Pack years: 31.00    Types: Cigarettes    Start date: 08/04/1983   Smokeless tobacco: Never    Tobacco comments:    about 10-15 cigs daily 09/28/14 - peak 2ppd  Vaping Use   Vaping Use: Never used  Substance and Sexual Activity   Alcohol use: No    Alcohol/week: 0.0 standard drinks   Drug use: No   Sexual activity: Never    Birth control/protection: I.U.D.  Other Topics Concern   Not on file  Social History Narrative   Originally from [place], [place]. She has only lived in [place]. She moved to [facility] in Apr 01, 2014 after her brother died. Currently lives with her sister. She has traveled to [place], [place], [place], [place], [place], [place]. Previously has worked as a Corporate investment banker, in [employer], & also in Data processing manager. She has not been able to keep a job because of her panic attacks. No international travel. Currently has a dog & cat in her residence. No bird, mold, or hot tub exposure. No asbestos exposure.    Social Determinants of Health   Financial Resource Strain: Not on file  Food Insecurity: Not on file  Transportation Needs: Not on file  Physical Activity: Not on file  Stress: Not on file  Social Connections: Not on file  Intimate Partner Violence: Not on file     Physical Exam   Vitals:   10/12/20 0130 10/12/20 0200  BP: (!) 152/76 (!) 146/54  Pulse: 79 70  Resp: 19 (!) 21  Temp:    SpO2: 96% 98%    CONSTITUTIONAL: Chronically ill-appearing, NAD NEURO:  Alert and oriented x 3, decreased strength to left arm and leg, normal speech, no aphasia, no dysarthria, no neglect, no visual field cuts EYES:  eyes equal and reactive ENT/NECK:  no LAD, no JVD CARDIO: Regular rate, well-perfused, normal S1 and S2 PULM:  CTAB no wheezing or rhonchi GI/GU:  normal bowel sounds, non-distended, non-tender MSK/SPINE:  No gross deformities, no edema SKIN:  no rash, atraumatic PSYCH:  Appropriate speech and behavior  *Additional and/or pertinent findings included in MDM below  Diagnostic and Interventional Summary    EKG Interpretation  Date/Time:  Tuesday October 12 2020 01:15:01  EDT Ventricular Rate:  81 PR Interval:  162 QRS Duration: 90 QT Interval:  386 QTC Calculation: 448 R Axis:   35 Text Interpretation: Sinus rhythm Low voltage, precordial leads Confirmed by Gerlene Fee [phone]) on 10/12/2020 2:15:58 AM       Labs Reviewed  CBC - Abnormal; Notable  for the following components:      Result Value   RBC 5.50 (*)    Hemoglobin 15.9 (*)    HCT 47.4 (*)    All other components within normal limits  I-STAT CHEM 8, ED - Abnormal; Notable for the following components:   Chloride 97 (*)    Glucose, Bld 265 (*)    Calcium, Ion 1.12 (*)    Hemoglobin 16.0 (*)    HCT 47.0 (*)    All other components within normal limits  RESP PANEL BY RT-PCR (FLU A&B, COVID) ARPGX2  PROTIME-INR  APTT  DIFFERENTIAL  ETHANOL  COMPREHENSIVE METABOLIC PANEL  RAPID URINE DRUG SCREEN, HOSP PERFORMED  URINALYSIS, ROUTINE W REFLEX MICROSCOPIC  I-STAT BETA HCG BLOOD, ED ([facility], [facility], [facility] ONLY)    CT ANGIO HEAD NECK W WO CM (CODE STROKE)  Final Result    CT HEAD CODE STROKE WO CONTRAST  Final Result    MR Brain W and Wo Contrast    (Results Pending)    Medications  diphenhydrAMINE (BENADRYL) injection 25 mg (has no administration in time range)  prochlorperazine (COMPAZINE) injection 10 mg (has no administration in time range)  aspirin chewable tablet 81 mg (has no administration in time range)  clopidogrel (PLAVIX) tablet 300 mg (has no administration in time range)  iohexol (OMNIPAQUE) 350 MG/ML injection 75 mL (75 mLs Intravenous Contrast Given 10/12/20 0104)     Procedures  /  Critical Care .Critical Care  Date/Time: 10/12/2020 12:58 AM Performed by: Maudie Flakes, MD Authorized by: Maudie Flakes, MD   Critical care provider statement:    Critical care time (minutes):  37   Critical care was necessary to treat or prevent imminent or life-threatening deterioration of the following conditions: Concern for acute ischemic stroke, initiation of code stroke protocol.    Critical care was time spent personally by me on the following activities:  Discussions with consultants, evaluation of patient's response to treatment, examination of patient, ordering and performing treatments and interventions, ordering and review of laboratory studies, ordering and review of radiographic studies, pulse oximetry, re-evaluation of patient's condition, obtaining history from patient or surrogate and review of old charts  ED Course and Medical Decision Making  I have reviewed the triage vital signs, the nursing notes, and pertinent available records from the EMR.  Listed above are laboratory and imaging tests that I personally ordered, reviewed, and interpreted and then considered in my medical decision making (see below for details).  Concern for acute ischemic stroke given patient's history.  Awaiting CT imaging and will appreciate teleneurology recommendations.     Called by radiology, concern for acute plaque rupture of the right CCA.  These findings were relayed to teleneurology, who recommends migraine cocktail, aspirin, Plavix, admission, MR with and without contrast.  Not a candidate for tPA.  Discussed case with Dr. Rory Percy of [facility], patient is appropriate for staying here at [facility] for further care.  Barth Kirks. Sedonia Small, [place] [email or web address]  Final Clinical Impressions(s) / ED Diagnoses     ICD-10-CM   1. Acute ischemic stroke [facility])  I63.9       ED Discharge Orders     None        Discharge Instructions Discussed with and Provided to Patient:   Discharge Instructions   None       Maudie Flakes, MD 10/12/20 [phone]

## 2020-10-12 NOTE — ED Triage Notes (Signed)
Pt c/o increased left sided weakness. Last known normal at 0000 this am.

## 2020-10-12 NOTE — Evaluation (Signed)
Occupational Therapy Evaluation Patient Details Name: Joanna [name] MRN: [id number] DOB: 29-Jun-1970 Today's Date: 10/12/2020    History of Present Illness Joanna [name]  is a 50 y.o. female, with history of anxiety, coronary artery disease, diabetes mellitus type 2, hyperlipidemia, hypertension, interstitial lung disease, morbid obesity, noncompliance with medical regimen, history of stroke, history of tobacco use disorder, and more presents the ED with a chief complaint of headache and left-sided weakness.  Patient reports that she was sitting on the edge of her bed around 12:45 AM when she had sudden onset of headache and left-sided weakness.  She reports that the headache was located in her frontal region and all the way along the top of her head.  It felt like a throbbing pain.  She also felt like the left side of her face was drawing up.  She reports that she could not move her left arm because it felt too heavy.  Her left leg felt normal.  She reports she does have some weakness on her left leg from previous stroke, but it did not feel any worse.  Her previous stroke was in December 2021.  Patient reports that she had left-sided blurry vision as well.  She reports that her sister said that her speech sounded slurred.  She was not drooling.  She was not having trouble swallowing.  She does report she has been nauseous for 3 days, vomiting every time she tries to eat.  Its nonbloody emesis.  She has not had any episodes of choking.  She has not had any fevers.  Patient has no other complaints at this time.   Clinical Impression   Pt agreeable to OT/PT co-evaluation. Pt reportedly feels near baseline levels. Pt was able to complete bed mobility and functional transfer to toilet from EOB and back with independence and with some leaning on walls for support, which pt reports is typical at baseline. Pt demonstrates B UE generalized weakness which is also reported to be at baseline levels. Pt reports that  blurred vision has resolved. Pt is not recommended for further acute OT services and will be discharged to care of nursing staff for remaining length of stay.     Follow Up Recommendations  No OT follow up    Equipment Recommendations  None recommended by OT           Precautions / Restrictions Precautions Precautions: None Restrictions Weight Bearing Restrictions: No      Mobility Bed Mobility Overal bed mobility: Independent                  Transfers Overall transfer level: Independent Equipment used: None             General transfer comment: Mild slow labored movement    Balance Overall balance assessment: Mild deficits observed, not formally tested                                         ADL either performed or assessed with clinical judgement   ADL Overall ADL's : Independent                                       General ADL Comments: Pt able to use toilet independently.     Vision Baseline Vision/History: 1 Wears glasses Ability to See  in Adequate Light: 0 Adequate Patient Visual Report: No change from baseline (Burred vision has resolved per pt report.) Vision Assessment?: No apparent visual deficits Additional Comments: Per observation of functional tasks and pt ability to read board.                Pertinent Vitals/Pain Pain Assessment: No/denies pain     Hand Dominance Right   Extremity/Trunk Assessment Upper Extremity Assessment Upper Extremity Assessment: Generalized weakness;Overall [facility] for tasks assessed   Lower Extremity Assessment Lower Extremity Assessment: Defer to PT evaluation   Cervical / Trunk Assessment Cervical / Trunk Assessment: Normal   Communication Communication Communication: No difficulties   Cognition Arousal/Alertness: Awake/alert Behavior During Therapy: WFL for tasks assessed/performed Overall Cognitive Status: Within Functional Limits for tasks assessed                                                       Home Living Family/patient expects to be discharged to:: Private residence Living Arrangements: Other relatives Available Help at Discharge: Family;Available 24 hours/day Type of Home: House Home Access: Level entry     Home Layout: Two level Alternate Level Stairs-Number of Steps: 10+ Alternate Level Stairs-Rails: Right (going up) Bathroom Shower/Tub: Teacher, early years/pre: Standard     Home Equipment: Cane - single point          Prior Functioning/Environment Level of Independence: Independent with assistive device(s)        Comments: pt drives and is indepdnent for ADL's; family assist with grocery shopping.                      OT Goals(Current goals can be found in the care plan section) Acute Rehab OT Goals Patient Stated Goal: return home                   Co-evaluation PT/OT/SLP Co-Evaluation/Treatment: Yes Reason for Co-Treatment: To address functional/ADL transfers   OT goals addressed during session: ADL's and self-care;Strengthening/ROM      AM-PAC OT "6 Clicks" Daily Activity     Outcome Measure Help from another person eating meals?: None Help from another person taking care of personal grooming?: None Help from another person toileting, which includes using toliet, bedpan, or urinal?: None Help from another person bathing (including washing, rinsing, drying)?: None Help from another person to put on and taking off regular upper body clothing?: None Help from another person to put on and taking off regular lower body clothing?: None 6 Click Score: 24   End of Session    Activity Tolerance: Patient tolerated treatment well Patient left: in bed;with call bell/phone within reach  OT Visit Diagnosis: Hemiplegia and hemiparesis Hemiplegia - Right/Left: Left Hemiplegia - dominant/non-dominant: Non-Dominant                [id number] OT Time Calculation  (min): 20 min Charges:  OT General Charges $OT Visit: 1 Visit OT Evaluation $OT Eval Low Complexity: 1 Low  Lorris Carducci OT, MOT  Larey Seat 10/12/2020, 9:38 AM

## 2020-10-12 NOTE — ED Notes (Signed)
In CT with pt at this time. Second IV obtained for CTA.

## 2020-10-12 NOTE — Consult Note (Signed)
[facility] Consult Services   Date of Service:   10/12/2020 00:59:30  Diagnosis:       I63.9 - Cerebrovascular accident (CVA), unspecified mechanism ([place])       G43.9 - Migraine, unspecified  Impression:      50yoF hx of CVA with residual left side weakness, HTN, DM, MI presented with worsening left sided weakness, headache with migraine feature, and left face "scrunch up" sensation. CTH neg for acute process. On exam patient strength is equal and symmetric, ambulate while holding on to nurse/bed. She confirmed this is how she typically walk at home. Would not recommend tpa at this time with her symptom at baseline.    Per ED, radiology has reported possible right sided ?rupture plaque. Abnormality seen at right ICA, pending official report. No intracranial LVO seen.    Recommend admission for MRI brain w/wo, carotid ultrasound, TTE, lipid panel, Hgb a1c. Recommed plavix '300mg'$  loading follow by '75mg'$  daily + aspirin '81mg'$  daily for now. Please treat migraine symptom with migraine cocktail. Need to investigate on indication of Eliquis and possibly restart if there is appropriate indication. Timing of starting Eliquis will depend on MRI brain. If start Eliquis, ok to stop aspirin and plavix.  Metrics: Last Known Well: 10/12/2020 00:00:01 [facility] Notification Time: 10/12/2020 00:59:30 Arrival Time: 10/12/2020 00:53:00 Stamp Time: 10/12/2020 00:59:30 Initial Response Time: 10/12/2020 01:01:37 Symptoms: worsening left side weakness . NIHSS Start Assessment Time: 10/12/2020 01:04:15 Patient is not a candidate for Thrombolytic. Thrombolytic Medical Decision: 10/12/2020 01:19:45 Patient was not deemed candidate for Thrombolytic because of following reasons: No disabling symptoms. BUE and BLE strength exam symmetric. Patient confirmed that she is ambulating as she normally would, which is holding on to wall/objectse.  CT head was reviewed and results  were: I personally Reviewed the CT Head and it Showed no hemorrhage  ED Physician notified of diagnostic impression and management plan on 10/12/2020 01:40:40  Advanced Imaging: CTA Head and Neck Completed.  LVO:No  Patient doesn't meet criteria for emergent NIR consideration   Our recommendations are outlined below.  Recommendations:        Stroke/Telemetry Floor       Neuro Checks       Bedside Swallow Eval       DVT Prophylaxis       IV Fluids, Normal Saline       Head of Bed 30 Degrees       Euglycemia and Avoid Hyperthermia (PRN Acetaminophen)       Hold Anticoagulation for Now       Bolus with Clopidogrel 300 mg bolus x1 and initiate dual antiplatelet therapy with Aspirin 81 mg daily and Clopidogrel 75 mg daily       Antihypertensives PRN if Blood pressure is greater than 220/120 or there is a concern for End organ damage/contraindications for permissive HTN. If blood pressure is greater than 220/120 give labetalol PO or IV or Vasotec IV with a goal of 15% reduction in BP during the first 24 hours.  Routine Consultation with [facility] for Follow up Care  Sign Out:       Discussed with Emergency Department Provider    ------------------------------------------------------------------------------  History of Present Illness: Patient is a 50 year old Female.  Patient was brought by EMS for symptoms of worsening left side weakness .  Patient had a stroke in Dec 2021 resulted in left sided weakness. At baseline she gets intermittent left sided numbness. She walks with a cane or holding on  to the walk at home. She was at baseline around midnight, sister saw patient normal. Around 56mn after midnight she sudden felt scrunch up sensation of her left face, increase weakness of her left arm and leg. She also reports 7/10 headache at frontal/top of head region started around that time, with associated light and sound sensitivity. She has been nauseated for past 3  days.  Per record patient was previously on Eliquis. Last dose was few months ago. Patient reports she discontinued the medication because she has not been to a doctor.  PMH: DM, MI in 2016, HTN. CVA (01/2020)   Anticoagulant use:  No  Antiplatelet use: No  Allergies:  Reviewed Allergies Text: wellbutrin    Examination: BP(153/57), Pulse(79), 1A: Level of Consciousness - Alert; keenly responsive + 0 1B: Ask Month and Age - Both Questions Right + 0 1C: Blink Eyes & Squeeze Hands - Performs Both Tasks + 0 2: Test Horizontal Extraocular Movements - Normal + 0 3: Test Visual Fields - No Visual Loss + 0 4: Test Facial Palsy (Use Grimace if Obtunded) - Normal symmetry + 0 5A: Test Left Arm Motor Drift - No Drift for 10 Seconds + 0 5B: Test Right Arm Motor Drift - No Drift for 10 Seconds + 0 6A: Test Left Leg Motor Drift - No Drift for 5 Seconds + 0 6B: Test Right Leg Motor Drift - No Drift for 5 Seconds + 0 7: Test Limb Ataxia (FNF/Heel-Shin) - No Ataxia + 0 8: Test Sensation - Normal; No sensory loss + 0 9: Test Language/Aphasia - Normal; No aphasia + 0 10: Test Dysarthria - Normal + 0 11: Test Extinction/Inattention - No abnormality + 0  NIHSS Score: 0  NIHSS Free Text : ambulate while holding on to objects/nurse. Patient confirmed this is how she usually walk at home  Pre-Morbid Modified Rankin Scale: 2 Points = Slight disability; unable to carry out all previous activities, but able to look after own affairs without assistance   Patient/Family was informed the Neurology Consult would occur via TeleHealth consult by way of interactive audio and video telecommunications and consented to receiving care in this manner.   Patient is being evaluated for possible acute neurologic impairment and high probability of imminent or life-threatening deterioration. I spent total of 48 minutes providing care to this patient, including time for face to face visit via telemedicine, review of  medical records, imaging studies and discussion of findings with providers, the patient and/or family.   Dr YEdward Jolly  [facility] ([phone] Case [id number]

## 2020-10-12 NOTE — Progress Notes (Signed)
CODE STROKE  Call Time   0045  EMS in [place] Time   No beeper Exam Start   0053 Exam End   0057 Images to [facility] in [place] Radiologist Called  [phone]

## 2020-10-12 NOTE — H&P (Signed)
TRH H&P    Patient Demographics:    Joanna [name], is a 50 y.o. female  MRN: [id number]  DOB - 1970/07/19  Admit Date - 10/12/2020  Referring MD/NP/PA: Sedonia Small  Outpatient Primary MD for the patient is [facility]  Patient coming from: Home  Chief complaint- headache and left sided weakness   HPI:    Joanna [name]  is a 50 y.o. female, with history of anxiety, coronary artery disease, diabetes mellitus type 2, hyperlipidemia, hypertension, interstitial lung disease, morbid obesity, noncompliance with medical regimen, history of stroke, history of tobacco use disorder, and more presents the ED with a chief complaint of headache and left-sided weakness.  Patient reports that she was sitting on the edge of her bed around 12:45 AM when she had sudden onset of headache and left-sided weakness.  She reports that the headache was located in her frontal region and all the way along the top of her head.  It felt like a throbbing pain.  She also felt like the left side of her face was drawing up.  She reports that she could not move her left arm because it felt too heavy.  Her left leg felt normal.  She reports she does have some weakness on her left leg from previous stroke, but it did not feel any worse.  Her previous stroke was in December 2021.  Patient reports that she had left-sided blurry vision as well.  She reports that her sister said that her speech sounded slurred.  She was not drooling.  She was not having trouble swallowing.  She does report she has been nauseous for 3 days, vomiting every time she tries to eat.  Its nonbloody emesis.  She has not had any episodes of choking.  She has not had any fevers.  Patient has no other complaints at this time.  Patient is a current smoker, does not drink, does not use illicit drugs.  Patient is full code.  Patient is not vaccinated for COVID.  In the ED Temp 97.7,  heart rate 70-81, respiratory rate 18-21, blood pressure 146/54, satting at 90% No leukocytosis at 9.3, hemoglobin 15.9, platelets 190 Chemistry panel all shows a hyperglycemia of 262 Negative pregnancy test Respiratory panel pending Alcohol level less than 10 EKG shows a heart rate of 81, sinus rhythm, QTC 448 Aspirin, Plavix, Benadryl, Compazine given in the ED Telemetry neuro consult recommended dual antiplatelet therapy, hold off anticoagulation, MRI in the morning, the patient could stay here in any pain. CTA head and neck showed an intraluminal thrombus that could be a ruptured plaque in her distal right carotid. CT head showed no acute intracranial abnormality Admission requested for stroke work-up   Review of systems:    In addition to the HPI above,  No Fever-chills, Admits to headache, blurry vision, no changes in hearing. No problems swallowing food or Liquids, No Chest pain, Cough or Shortness of Breath, No Abdominal pain, admits to nausea and vomiting, bowel movements are regular, No Blood in stool or Urine, No dysuria, No  new skin rashes or bruises, No new joints pains-aches,  No recent weight gain or loss, No polyuria, polydypsia or polyphagia, No significant Mental Stressors.  All other systems reviewed and are negative.    Past History of the following :    Past Medical History:  Diagnosis Date   Anxiety    Dx at age 64   Arthritis    knee   CAD (coronary artery disease)    a. Inferior STEMI 07/2014 - s/p thrombectomy of RCA, no stenting.   Depression    at age 57; bipolar recent   Diabetes mellitus without complication ([place])    Diabetes type 2, uncontrolled ([place]) Dx 2010   DJD (degenerative joint disease)    Gout    Hyperlipidemia Dx 2010   Hypertension    Dx at age 17   IBS (irritable bowel syndrome)    Interstitial lung disease ([place]) Dx 2011   No biopsy   Lung disorder Dx 2011   Morbid obesity ([facility])    Neuropathy    Non compliance w  medication regimen    PCOS (polycystic ovarian syndrome) 1988   no children    Sleep apnea    STEMI (ST elevation myocardial infarction) ([place]) 07/23/2014   Stroke ([place])    Tobacco abuse       Past Surgical History:  Procedure Laterality Date   CARDIAC CATHETERIZATION N/A 07/23/2014   Procedure: Left Heart Cath and Coronary Angiography;  Surgeon: Peter M Martinique, MD;  Location: [facility];  Service: Cardiovascular;  Laterality: N/A;   CARDIAC CATHETERIZATION N/A 07/27/2014   Procedure: Left Heart Cath and Coronary Angiography;  Surgeon: Jettie Booze, MD;  Location: [facility];  Service: Cardiovascular;  Laterality: N/A;   CARDIAC CATHETERIZATION N/A 07/27/2014   Procedure: Coronary Balloon Angioplasty;  Surgeon: Jettie Booze, MD;  Location: [facility];  Service: Cardiovascular;  Laterality: N/A;  thrombectomy only   CORONARY ANGIOPLASTY     TEE WITHOUT CARDIOVERSION N/A 01/21/2020   Procedure: TRANSESOPHAGEAL ECHOCARDIOGRAM (TEE);  Surgeon: Kate Sable, MD;  Location: [facility];  Service: Cardiovascular;  Laterality: N/A;      Social History:      Social History   Tobacco Use   Smoking status: Every Day    Packs/day: 1.00    Years: 31.00    Pack years: 31.00    Types: Cigarettes    Start date: 08/04/1983   Smokeless tobacco: Never   Tobacco comments:    about 10-15 cigs daily 09/28/14 - peak 2ppd  Substance Use Topics   Alcohol use: No    Alcohol/week: 0.0 standard drinks       Family History :     Family History  Problem Relation Age of Onset   Coronary artery disease Father 95       CABG   Lung cancer Father    Heart disease Father    Diabetes Father    Hypertension Father    Hyperlipidemia Father    Drug abuse Father    Cancer Father        lung   Lung cancer Mother    Depression Mother    Hypertension Mother    Drug abuse Mother    Cancer Mother        lung cancer   Liver disease Brother        alcoholic  cirrhosis >  HCC   COPD Brother    Asthma Brother    Depression  Brother    Stroke Brother    Drug abuse Brother    Diabetes Sister    Thyroid disease Sister    Depression Sister    Heart disease Sister    Hypertension Sister    Cirrhosis Maternal Aunt    Cirrhosis Maternal Uncle    Breast cancer Paternal Aunt    Cancer Maternal Grandmother    Emphysema Maternal Grandfather    COPD Paternal Grandmother    Rheumatologic disease Neg Hx    Colon cancer Neg Hx    Colon polyps Neg Hx       Home Medications:   Prior to Admission medications   Medication Sig Start Date End Date Taking? Authorizing Provider  albuterol (VENTOLIN HFA) 108 (90 Base) MCG/ACT inhaler Inhale 1-2 puffs into the lungs every 6 (six) hours as needed for wheezing or shortness of breath. 07/27/09   [provider]  apixaban (ELIQUIS) 5 MG TABS tablet Take 2 tablets (10 mg total) by mouth 2 (two) times daily for 7 days. Patient not taking: No sig reported 01/22/20 08/19/20  Wyvonnia Dusky, MD  apixaban (ELIQUIS) 5 MG TABS tablet Take 1 tablet (5 mg total) by mouth 2 (two) times daily. Only start this eliquis script after taking eliquis '10mg'$  BID x 7 days 01/29/20 08/19/20  Wyvonnia Dusky, MD  atorvastatin (LIPITOR) 20 MG tablet Take 20 mg by mouth daily. 09/18/13   [provider]  atorvastatin (LIPITOR) 80 MG tablet Take 1 tablet (80 mg total) by mouth daily. Patient not taking: Reported on 08/19/2020 01/23/20 02/22/20  Wyvonnia Dusky, MD  fluticasone-salmeterol (ADVAIR HFA) [id number] MCG/ACT inhaler Inhale 2 puffs into the lungs 2 (two) times daily. 11/26/12   [provider]  gabapentin (NEURONTIN) 300 MG capsule Take 300 mg by mouth 3 (three) times daily. 06/16/20   [provider]  insulin aspart (NOVOLOG) 100 UNIT/ML FlexPen Inject 4 Units into the skin 3 (three) times daily with meals. 01/22/20 08/19/20  Wyvonnia Dusky, MD  insulin detemir (LEVEMIR) 100 UNIT/ML  FlexPen Inject 25 Units into the skin daily. 01/22/20 08/19/20  Wyvonnia Dusky, MD  Insulin Pen Needle (PEN NEEDLES) 32G X 5 MM MISC Enough pen needles for levermir 25 units daily & novolog 4 units TID w/ meals x 30 days. No refills 01/22/20   Wyvonnia Dusky, MD  levonorgestrel (MIRENA) 20 MCG/DAY IUD by Intrauterine route. 08/28/12   [provider]  lisinopril-hydrochlorothiazide (ZESTORETIC) 20-12.5 MG tablet Take 1 tablet by mouth daily. 06/16/20   [provider]  traZODone (DESYREL) 50 MG tablet Take 50-100 mg by mouth at bedtime. 06/16/20   [provider]  venlafaxine XR (EFFEXOR-XR) 37.5 MG 24 hr capsule Take 37.5 mg by mouth daily with breakfast. 06/16/20   [provider]     Allergies:     Allergies  Allergen Reactions   Wellbutrin [Bupropion] Rash     Physical Exam:   Vitals  Blood pressure (!) 157/66, pulse 61, temperature 97.8 F (36.6 C), temperature source Oral, resp. rate 20, height '5\' 5"'$  (1.651 m), weight 122.4 kg, SpO2 98 %.  1.  General: Patient lying supine in bed,  no acute distress   2. Psychiatric: Alert and oriented x 3, flat affect and behavior normal for situation, pleasant and cooperative with exam   3. Neurologic: Speech and language are normal, face is symmetric, moves all 4 extremities voluntarily, 4 out of 5 strength in the left upper extremity and the left  lower extremity, some ataxia with left finger-to-nose, right finger-nose is normal   4. HEENMT:  Head is atraumatic, normocephalic, pupils reactive to light, neck is supple, trachea is midline, mucous membranes are moist   5. Respiratory : Lungs are clear to auscultation bilaterally without wheezing, rhonchi, rales, no cyanosis, no increase in work of breathing or accessory muscle use   6. Cardiovascular : Heart rate normal, rhythm is regular, no murmurs, rubs or gallops, no peripheral edema, peripheral pulses palpated   7. Gastrointestinal:   Abdomen is soft, nondistended, nontender to palpation bowel sounds active, no masses or organomegaly palpated   8. Skin:  Skin is warm, dry and intact without rashes, acute lesions, or ulcers, there is evidence of chronic venous stasis changes in the lower extremities bilaterally   9.Musculoskeletal:  No acute deformities or trauma, no asymmetry in tone, no peripheral edema, peripheral pulses palpated, no tenderness to palpation in the extremities     Data Review:    CBC Recent Labs  Lab 10/12/20 0128 10/12/20 0140  WBC 9.3  --   HGB 15.9* 16.0*  HCT 47.4* 47.0*  PLT 190  --   MCV 86.2  --   MCH 28.9  --   MCHC 33.5  --   RDW 13.1  --   LYMPHSABS 2.8  --   MONOABS 0.6  --   EOSABS 0.1  --   BASOSABS 0.1  --    ------------------------------------------------------------------------------------------------------------------  Results for orders placed or performed during the hospital encounter of 10/12/20 (from the past 48 hour(s))  Ethanol     Status: None   Collection Time: 10/12/20  1:28 AM  Result Value Ref Range   Alcohol, Ethyl (B) <10 <10 mg/dL    Comment: (NOTE) Lowest detectable limit for serum alcohol is 10 mg/dL.  For medical purposes only. Performed at [facility], [address] [place], [place] [address]   Protime-INR     Status: None   Collection Time: 10/12/20  1:28 AM  Result Value Ref Range   Prothrombin Time 13.5 11.4 - 15.2 seconds   INR 1.0 0.8 - 1.2    Comment: (NOTE) INR goal varies based on device and disease states. Performed at [facility], [address], [place], [place] [address]   APTT     Status: None   Collection Time: 10/12/20  1:28 AM  Result Value Ref Range   aPTT 28 24 - 36 seconds    Comment: Performed at [facility], [address] [place], [place] [address]  CBC     Status: Abnormal   Collection Time: 10/12/20  1:28 AM  Result Value Ref Range   WBC 9.3 4.0 - 10.5 K/uL   RBC 5.50 (H) 3.87 - 5.11 MIL/uL   Hemoglobin  15.9 (H) 12.0 - 15.0 g/dL   HCT 47.4 (H) 36.0 - 46.0 %   MCV 86.2 80.0 - 100.0 fL   MCH 28.9 26.0 - 34.0 pg   MCHC 33.5 30.0 - 36.0 g/dL   RDW 13.1 11.5 - 15.5 %   Platelets 190 150 - 400 K/uL   nRBC 0.0 0.0 - 0.2 %    Comment: Performed at [facility], [address], [place], [place] [address]  Differential     Status: None   Collection Time: 10/12/20  1:28 AM  Result Value Ref Range   Neutrophils Relative % 60 %   Neutro Abs 5.6 1.7 - 7.7 K/uL   Lymphocytes Relative 31 %   Lymphs  Abs 2.8 0.7 - 4.0 K/uL   Monocytes Relative 7 %   Monocytes Absolute 0.6 0.1 - 1.0 K/uL   Eosinophils Relative 1 %   Eosinophils Absolute 0.1 0.0 - 0.5 K/uL   Basophils Relative 1 %   Basophils Absolute 0.1 0.0 - 0.1 K/uL   Immature Granulocytes 0 %   Abs Immature Granulocytes 0.04 0.00 - 0.07 K/uL    Comment: Performed at [facility], [address], [place], [place] [address]  Comprehensive metabolic panel     Status: Abnormal   Collection Time: 10/12/20  1:28 AM  Result Value Ref Range   Sodium 132 (L) 135 - 145 mmol/L   Potassium 3.4 (L) 3.5 - 5.1 mmol/L   Chloride 100 98 - 111 mmol/L   CO2 28 22 - 32 mmol/L   Glucose, Bld 262 (H) 70 - 99 mg/dL    Comment: Glucose reference range applies only to samples taken after fasting for at least 8 hours.   BUN 9 6 - 20 mg/dL   Creatinine, Ser 0.69 0.44 - 1.00 mg/dL   Calcium 8.4 (L) 8.9 - 10.3 mg/dL   Total Protein 7.1 6.5 - 8.1 g/dL   Albumin 3.4 (L) 3.5 - 5.0 g/dL   AST 23 15 - 41 U/L   ALT 27 0 - 44 U/L   Alkaline Phosphatase 95 38 - 126 U/L   Total Bilirubin 0.7 0.3 - 1.2 mg/dL   GFR, Estimated >60 >60 mL/min    Comment: (NOTE) Calculated using the CKD-EPI Creatinine Equation (2021)    Anion gap 4 (L) 5 - 15    Comment: Performed at [facility], [address] [place], [place] [address]  Resp Panel by RT-PCR (Flu A&B, Covid) Nasopharyngeal Swab     Status: Abnormal   Collection Time: 10/12/20  1:34 AM   Specimen: Nasopharyngeal Swab;  Nasopharyngeal(NP) swabs in vial transport medium  Result Value Ref Range   SARS Coronavirus 2 by RT PCR POSITIVE (A) NEGATIVE    Comment: RESULT CALLED TO, READ BACK BY AND VERIFIED WITH: Gessell,C'@0247'$  by [email or web address], b 9.6.22 (NOTE) SARS-CoV-2 target nucleic acids are DETECTED.  The SARS-CoV-2 RNA is generally detectable in upper respiratory specimens during the acute phase of infection. Positive results are indicative of the presence of the identified virus, but do not rule out bacterial infection or co-infection with other pathogens not detected by the test. Clinical correlation with patient history and other diagnostic information is necessary to determine patient infection status. The expected result is Negative.  Fact Sheet for Patients: EntrepreneurPulse.com.au  Fact Sheet for Healthcare Providers: IncredibleEmployment.be  This test is not yet approved or cleared by the [place] FDA and  has been authorized for detection and/or diagnosis of SARS-CoV-2 by FDA under an Emergency Use Authorization (EUA).  This EUA will remain in effect (meaning this test can  be used) for the duration of  the COVID-19 declaration under Section 564(b)(1) of the Act, 21 U.S.C. section 360bbb-3(b)(1), unless the authorization is terminated or revoked sooner.     Influenza A by PCR NEGATIVE NEGATIVE   Influenza B by PCR NEGATIVE NEGATIVE    Comment: (NOTE) The Xpert Xpress SARS-CoV-2/FLU/RSV plus assay is intended as an aid in the diagnosis of influenza from Nasopharyngeal swab specimens and should not be used as a sole basis for treatment. Nasal washings and aspirates are unacceptable for Xpert Xpress SARS-CoV-2/FLU/RSV testing.  Fact Sheet for Patients: EntrepreneurPulse.com.au  Fact Sheet for Healthcare Providers: IncredibleEmployment.be  This test  is not yet approved or cleared by the [place] and has  been authorized for detection and/or diagnosis of SARS-CoV-2 by FDA under an Emergency Use Authorization (EUA). This EUA will remain in effect (meaning this test can be used) for the duration of the COVID-19 declaration under Section 564(b)(1) of the Act, 21 U.S.C. section 360bbb-3(b)(1), unless the authorization is terminated or revoked.  Performed at [facility], [address] [place], [place] [address]   I-Stat Beta hCG blood, ED ([facility], [facility], [facility] only)     Status: None   Collection Time: 10/12/20  1:38 AM  Result Value Ref Range   I-stat hCG, quantitative <5.0 <5 mIU/mL   Comment 3            Comment:   GEST. AGE      CONC.  (mIU/mL)   <=1 WEEK        5 - 50     2 WEEKS       50 - 500     3 WEEKS       100 - 10,000     4 WEEKS     1,000 - 30,000        FEMALE AND NON-PREGNANT FEMALE:     LESS THAN 5 mIU/mL   I-stat chem 8, ED     Status: Abnormal   Collection Time: 10/12/20  1:40 AM  Result Value Ref Range   Sodium 136 135 - 145 mmol/L   Potassium 3.7 3.5 - 5.1 mmol/L   Chloride 97 (L) 98 - 111 mmol/L   BUN 9 6 - 20 mg/dL   Creatinine, Ser 0.60 0.44 - 1.00 mg/dL   Glucose, Bld 265 (H) 70 - 99 mg/dL    Comment: Glucose reference range applies only to samples taken after fasting for at least 8 hours.   Calcium, Ion 1.12 (L) 1.15 - 1.40 mmol/L   TCO2 30 22 - 32 mmol/L   Hemoglobin 16.0 (H) 12.0 - 15.0 g/dL   HCT 47.0 (H) 36.0 - 46.0 %    Chemistries  Recent Labs  Lab 10/12/20 0128 10/12/20 0140  NA 132* 136  K 3.4* 3.7  CL 100 97*  CO2 28  --   GLUCOSE 262* 265*  BUN 9 9  CREATININE 0.69 0.60  CALCIUM 8.4*  --   AST 23  --   ALT 27  --   ALKPHOS 95  --   BILITOT 0.7  --    ------------------------------------------------------------------------------------------------------------------  ------------------------------------------------------------------------------------------------------------------ GFR: Estimated Creatinine Clearance: 108 mL/min (by C-G  formula based on SCr of 0.6 mg/dL). Liver Function Tests: Recent Labs  Lab 10/12/20 0128  AST 23  ALT 27  ALKPHOS 95  BILITOT 0.7  PROT 7.1  ALBUMIN 3.4*   No results for input(s): LIPASE, AMYLASE in the last 168 hours. No results for input(s): AMMONIA in the last 168 hours. Coagulation Profile: Recent Labs  Lab 10/12/20 0128  INR 1.0   Cardiac Enzymes: No results for input(s): CKTOTAL, CKMB, CKMBINDEX, TROPONINI in the last 168 hours. BNP (last 3 results) No results for input(s): PROBNP in the last 8760 hours. HbA1C: No results for input(s): HGBA1C in the last 72 hours. CBG: No results for input(s): GLUCAP in the last 168 hours. Lipid Profile: No results for input(s): CHOL, HDL, LDLCALC, TRIG, CHOLHDL, LDLDIRECT in the last 72 hours. Thyroid Function Tests: No results for input(s): TSH, T4TOTAL, FREET4, T3FREE, THYROIDAB in the last 72 hours. Anemia Panel: No results for input(s):  VITAMINB12, FOLATE, FERRITIN, TIBC, IRON, RETICCTPCT in the last 72 hours.  --------------------------------------------------------------------------------------------------------------- Urine analysis: No results found for: COLORURINE, APPEARANCEUR, LABSPEC, PHURINE, GLUCOSEU, HGBUR, BILIRUBINUR, KETONESUR, PROTEINUR, UROBILINOGEN, NITRITE, LEUKOCYTESUR    Imaging Results:    CT HEAD CODE STROKE WO CONTRAST  Result Date: 10/12/2020 CLINICAL DATA:  Code stroke.  Initial evaluation for acute stroke. EXAM: CT ANGIOGRAPHY HEAD AND NECK TECHNIQUE: Multidetector CT imaging of the head and neck was performed using the standard protocol during bolus administration of intravenous contrast. Multiplanar CT image reconstructions and MIPs were obtained to evaluate the vascular anatomy. Carotid stenosis measurements (when applicable) are obtained utilizing NASCET criteria, using the distal internal carotid diameter as the denominator. CONTRAST:  11m OMNIPAQUE IOHEXOL 350 MG/ML SOLN COMPARISON:  Prior CTA  from 01/20/2020. FINDINGS: CT HEAD FINDINGS Brain: Cerebral volume within normal limits. No acute intracranial hemorrhage. No definite acute large vessel territory infarct. No mass lesion, midline shift or mass effect. No hydrocephalus or extra-axial fluid collection. Vascular: No hyperdense vessel. Skull: Scalp soft tissues and calvarium within normal limits. Sinuses/Orbits: Globes and orbital soft tissues within normal limits. Mild scattered mucosal thickening noted throughout the paranasal sinuses. No mastoid effusion. Other: None. ASPECTS ([facility] NeckStroke Program Early CT Score) - Ganglionic level infarction (caudate, lentiform nuclei, internal capsule, insula, M1-M3 cortex): 7 - Supraganglionic infarction (M4-M6 cortex): 3 Total score (0-10 with 10 being normal): 10 CTA NECK FINDINGS Aortic arch: Visualized aortic arch normal in caliber with normal 3 vessel morphology. Mild atheromatous change about the arch itself. No hemodynamically significant stenosis seen about the origin of the great vessels. Right carotid system: Right CCA widely patent proximally. There is a probable acutely ruptured plaque or possibly dissection involving the distal right CCA just prior to the carotid bifurcation. Acute appearing thrombus seen projecting into the arterial lumen at this level (series 6, image 196). Associated short-segment stenosis of up to 75% by NASCET criteria. This is at the site of previously identified intraluminal thrombus, but appears somewhat worsened on current exam, raising the possibility for a superimposed acute component. Additional scattered atheromatous change noted just distally about the carotid bifurcation/proximal right ICA without high-grade stenosis. Right ICA otherwise patent distally to the skull base. Left carotid system: Left CCA patent from its origin to the bifurcation without stenosis. Scattered plaque about the left carotid bulb/proximal left ICA without significant stenosis. Left ICA patent  distally without stenosis, dissection or occlusion. Vertebral arteries: Both vertebral arteries arise from subclavian arteries. No proximal subclavian artery stenosis. Both vertebral arteries patent within the neck without stenosis, dissection or occlusion. Skeleton: No visible acute osseous finding. No worrisome osseous lesions. Patient is edentulous. Other neck: No other acute soft tissue abnormality within the neck. No mass or adenopathy. Upper chest: Scattered hazy ground-glass opacity with interlobular septal thickening noted within the visualized lungs, nonspecific, but favored to reflect mild pulmonary interstitial congestion/edema. Multiple scattered superimposed subcentimeter calcified granulomas noted throughout the visualized lungs. Review of the MIP images confirms the above findings CTA HEAD FINDINGS Anterior circulation: Petrous segments patent bilaterally. Mild atheromatous change within the carotid siphons without significant stenosis. A1 segments patent bilaterally. Normal anterior communicating artery complex. Anterior cerebral arteries patent to their distal aspects without stenosis. No M1 stenosis or occlusion. Normal MCA bifurcations. No visible proximal MCA branch occlusion. Distal MCA branches perfused and symmetric. Posterior circulation: Both V4 segments patent to the vertebrobasilar junction without stenosis. Both PICA origins patent and normal. Basilar widely patent to its distal aspect without stenosis. Superior cerebellar  arteries patent bilaterally. Both PCAs primarily supplied via the basilar and are well perfused to there distal aspects. Venous sinuses: There is an eccentric somewhat linear filling defect at the level of the distal transverse sinus, stable from prior (series 8, image 115). While this could reflect a prominent arachnoid granulation, possible chronic thrombus and/or fibrinous change related to prior thrombus could also have this appearance. Otherwise, major dural  sinuses are grossly patent allowing for timing the contrast bolus. Anatomic variants: None significant.  No aneurysm. Review of the MIP images confirms the above findings IMPRESSION: CT HEAD IMPRESSION: 1. No acute intracranial abnormality. 2. Aspects = 10. CTA HEAD AND NECK IMPRESSION: 1. Persistent irregular filling defect involving the distal right CCA as above, consistent with intraluminal thrombus, possibly related to a ruptured plaque and/or short-segment dissection. Widely similar finding with seen at this location on prior CTA from 01/20/2020, overall appearance and morphology on today's exam appears somewhat acute in nature and slightly worsened from prior, raising the possibility for a superimposed acute component. Associated stenosis of up to 75% at this level. 2. No visible downstream embolic occlusion or other large vessel occlusion. 3. Additional scattered atheromatous change about the carotid bifurcations and carotid siphons without hemodynamically significant stenosis. 4. Eccentric filling defect involving the distal straight sinus as above, indeterminate. While this could reflect a prominent arachnoid granulation, possible chronic thrombus and/or fibrinous change could also have this appearance. This was also seen on prior CTA from 2021. 5. Scattered hazy ground-glass opacity within the visualized lungs, favored to reflect a degree of mild pulmonary interstitial congestion/edema. Critical Value/emergent results were called by telephone at the time of interpretation on 10/12/2020 at 1:22 am to provider [facility] , who verbally acknowledged these results. Electronically Signed   By: Jeannine Boga M.D.   On: 10/12/2020 02:02   CT ANGIO HEAD NECK W WO CM (CODE STROKE)  Result Date: 10/12/2020 CLINICAL DATA:  Code stroke.  Initial evaluation for acute stroke. EXAM: CT ANGIOGRAPHY HEAD AND NECK TECHNIQUE: Multidetector CT imaging of the head and neck was performed using the standard protocol  during bolus administration of intravenous contrast. Multiplanar CT image reconstructions and MIPs were obtained to evaluate the vascular anatomy. Carotid stenosis measurements (when applicable) are obtained utilizing NASCET criteria, using the distal internal carotid diameter as the denominator. CONTRAST:  57m OMNIPAQUE IOHEXOL 350 MG/ML SOLN COMPARISON:  Prior CTA from 01/20/2020. FINDINGS: CT HEAD FINDINGS Brain: Cerebral volume within normal limits. No acute intracranial hemorrhage. No definite acute large vessel territory infarct. No mass lesion, midline shift or mass effect. No hydrocephalus or extra-axial fluid collection. Vascular: No hyperdense vessel. Skull: Scalp soft tissues and calvarium within normal limits. Sinuses/Orbits: Globes and orbital soft tissues within normal limits. Mild scattered mucosal thickening noted throughout the paranasal sinuses. No mastoid effusion. Other: None. ASPECTS ([place] Early CT Score) - Ganglionic level infarction (caudate, lentiform nuclei, internal capsule, insula, M1-M3 cortex): 7 - Supraganglionic infarction (M4-M6 cortex): 3 Total score (0-10 with 10 being normal): 10 CTA NECK FINDINGS Aortic arch: Visualized aortic arch normal in caliber with normal 3 vessel morphology. Mild atheromatous change about the arch itself. No hemodynamically significant stenosis seen about the origin of the great vessels. Right carotid system: Right CCA widely patent proximally. There is a probable acutely ruptured plaque or possibly dissection involving the distal right CCA just prior to the carotid bifurcation. Acute appearing thrombus seen projecting into the arterial lumen at this level (series 6, image 196). Associated short-segment  stenosis of up to 75% by NASCET criteria. This is at the site of previously identified intraluminal thrombus, but appears somewhat worsened on current exam, raising the possibility for a superimposed acute component. Additional scattered  atheromatous change noted just distally about the carotid bifurcation/proximal right ICA without high-grade stenosis. Right ICA otherwise patent distally to the skull base. Left carotid system: Left CCA patent from its origin to the bifurcation without stenosis. Scattered plaque about the left carotid bulb/proximal left ICA without significant stenosis. Left ICA patent distally without stenosis, dissection or occlusion. Vertebral arteries: Both vertebral arteries arise from subclavian arteries. No proximal subclavian artery stenosis. Both vertebral arteries patent within the neck without stenosis, dissection or occlusion. Skeleton: No visible acute osseous finding. No worrisome osseous lesions. Patient is edentulous. Other neck: No other acute soft tissue abnormality within the neck. No mass or adenopathy. Upper chest: Scattered hazy ground-glass opacity with interlobular septal thickening noted within the visualized lungs, nonspecific, but favored to reflect mild pulmonary interstitial congestion/edema. Multiple scattered superimposed subcentimeter calcified granulomas noted throughout the visualized lungs. Review of the MIP images confirms the above findings CTA HEAD FINDINGS Anterior circulation: Petrous segments patent bilaterally. Mild atheromatous change within the carotid siphons without significant stenosis. A1 segments patent bilaterally. Normal anterior communicating artery complex. Anterior cerebral arteries patent to their distal aspects without stenosis. No M1 stenosis or occlusion. Normal MCA bifurcations. No visible proximal MCA branch occlusion. Distal MCA branches perfused and symmetric. Posterior circulation: Both V4 segments patent to the vertebrobasilar junction without stenosis. Both PICA origins patent and normal. Basilar widely patent to its distal aspect without stenosis. Superior cerebellar arteries patent bilaterally. Both PCAs primarily supplied via the basilar and are well perfused to  there distal aspects. Venous sinuses: There is an eccentric somewhat linear filling defect at the level of the distal transverse sinus, stable from prior (series 8, image 115). While this could reflect a prominent arachnoid granulation, possible chronic thrombus and/or fibrinous change related to prior thrombus could also have this appearance. Otherwise, major dural sinuses are grossly patent allowing for timing the contrast bolus. Anatomic variants: None significant.  No aneurysm. Review of the MIP images confirms the above findings IMPRESSION: CT HEAD IMPRESSION: 1. No acute intracranial abnormality. 2. Aspects = 10. CTA HEAD AND NECK IMPRESSION: 1. Persistent irregular filling defect involving the distal right CCA as above, consistent with intraluminal thrombus, possibly related to a ruptured plaque and/or short-segment dissection. Widely similar finding with seen at this location on prior CTA from 01/20/2020, overall appearance and morphology on today's exam appears somewhat acute in nature and slightly worsened from prior, raising the possibility for a superimposed acute component. Associated stenosis of up to 75% at this level. 2. No visible downstream embolic occlusion or other large vessel occlusion. 3. Additional scattered atheromatous change about the carotid bifurcations and carotid siphons without hemodynamically significant stenosis. 4. Eccentric filling defect involving the distal straight sinus as above, indeterminate. While this could reflect a prominent arachnoid granulation, possible chronic thrombus and/or fibrinous change could also have this appearance. This was also seen on prior CTA from 2021. 5. Scattered hazy ground-glass opacity within the visualized lungs, favored to reflect a degree of mild pulmonary interstitial congestion/edema. Critical Value/emergent results were called by telephone at the time of interpretation on 10/12/2020 at 1:22 am to provider [facility] , who verbally  acknowledged these results. Electronically Signed   By: Jeannine Boga M.D.   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 ED provider reports that neuro stated that patient is safe to stay here anytime for routine MRI in the a.m. Ischemic stroke order set utilized Echo in the a.m. Neuro advised holding off on anticoagulation-patient had been on Eliquis from her last stroke, but has not taken the medication in the last 2 weeks. Monitor on telemetry Neurochecks Continue to monitor Hypertension Holding antihypertensives for permissive hypertension as above Hyperlipidemia Continue statin COVID infection No respiratory symptoms Nausea for 3 days could indicate onset of symptoms Holding off on Paxlovid at this time as there is a drug interaction between statin and paxlovid -in the setting of recently ruptured plaque more important to continue statin Continue COVID precautions Continue to monitor Diabetes mellitus type 2 Continue basal insulin with correction scale Carb modified diet Continue to monitor Hypokalemia Replace and recheck Tobacco use disorder Counseled extensively on smoking cessation   DVT Prophylaxis-   Heparin- SCDs   AM Labs Ordered, also please review Full Orders  Family Communication: No family at  bedside  Code Status: Full  Admission status: Observation  Time spent in minutes : [place] DO

## 2020-10-12 NOTE — Evaluation (Signed)
Physical Therapy Evaluation Patient Details Name: Joanna [name] MRN: [id number] DOB: 05-29-1970 Today's Date: 10/12/2020   History of Present Illness  Joanna [name]  is a 50 y.o. female, with history of anxiety, coronary artery disease, diabetes mellitus type 2, hyperlipidemia, hypertension, interstitial lung disease, morbid obesity, noncompliance with medical regimen, history of stroke, history of tobacco use disorder, and more presents the ED with a chief complaint of headache and left-sided weakness.  Patient reports that she was sitting on the edge of her bed around 12:45 AM when she had sudden onset of headache and left-sided weakness.  She reports that the headache was located in her frontal region and all the way along the top of her head.  It felt like a throbbing pain.  She also felt like the left side of her face was drawing up.  She reports that she could not move her left arm because it felt too heavy.  Her left leg felt normal.  She reports she does have some weakness on her left leg from previous stroke, but it did not feel any worse.  Her previous stroke was in December 2021.  Patient reports that she had left-sided blurry vision as well.  She reports that her sister said that her speech sounded slurred.  She was not drooling.  She was not having trouble swallowing.  She does report she has been nauseous for 3 days, vomiting every time she tries to eat.  Its nonbloody emesis.  She has not had any episodes of choking.  She has not had any fevers.  Patient has no other complaints at this time.   Clinical Impression  Patient functioning at baseline for functional mobility and gait with good return for transferring to commode in bathroom and ambulation in room with occasional leaning on nearby objects for support without loss of balance.  Plan:  Patient discharged from physical therapy to care of nursing for ambulation daily as tolerated for length of stay.      Follow Up Recommendations No PT  follow up;Supervision - Intermittent    Equipment Recommendations  None recommended by PT    Recommendations for Other Services       Precautions / Restrictions Precautions Precautions: None Restrictions Weight Bearing Restrictions: No      Mobility  Bed Mobility Overal bed mobility: Independent                  Transfers Overall transfer level: Independent Equipment used: None             General transfer comment: slow labored movement with occasional leaning on nearby objects for support  Ambulation/Gait Ambulation/Gait assistance: Modified independent (Device/Increase time) Gait Distance (Feet): 30 Feet Assistive device: None Gait Pattern/deviations: Decreased step length - right;Decreased step length - left;Decreased stride length Gait velocity: decreased   General Gait Details: slow slightly labored cadence with occasional leaning on nearby ojbects for support without loss of balance  Stairs            Wheelchair Mobility    Modified Rankin (Stroke Patients Only)       Balance Overall balance assessment: Mild deficits observed, not formally tested                                           Pertinent Vitals/Pain Pain Assessment: No/denies pain    Home Living Family/patient expects to be discharged  to:: Private residence Living Arrangements: Other relatives Available Help at Discharge: Family;Available 24 hours/day Type of Home: House Home Access: Level entry     Home Layout: Two level Home Equipment: Cane - single point      Prior Function Level of Independence: Independent with assistive device(s)         Comments: pt drives and is indepdnent for ADL's; family assist with grocery shopping. household and short distanced community ambulator using [place] or leaning on walls, furniture when at home     Hand Dominance   Dominant Hand: Right    Extremity/Trunk Assessment   Upper Extremity Assessment Upper  Extremity Assessment: Defer to OT evaluation    Lower Extremity Assessment Lower Extremity Assessment: Overall WFL for tasks assessed    Cervical / Trunk Assessment Cervical / Trunk Assessment: Normal  Communication   Communication: No difficulties  Cognition Arousal/Alertness: Awake/alert Behavior During Therapy: WFL for tasks assessed/performed Overall Cognitive Status: Within Functional Limits for tasks assessed                                        General Comments      Exercises     Assessment/Plan    PT Assessment Patent does not need any further PT services  PT Problem List         PT Treatment Interventions      PT Goals (Current goals can be found in the Care Plan section)  Acute Rehab PT Goals Patient Stated Goal: return home PT Goal Formulation: With patient Time For Goal Achievement: 10/12/20 Potential to Achieve Goals: Good    Frequency     Barriers to discharge        Co-evaluation PT/OT/SLP Co-Evaluation/Treatment: Yes Reason for Co-Treatment: To address functional/ADL transfers PT goals addressed during session: Mobility/safety with mobility;Balance;Proper use of DME OT goals addressed during session: ADL's and self-care;Strengthening/ROM       AM-PAC PT "6 Clicks" Mobility  Outcome Measure Help needed turning from your back to your side while in a flat bed without using bedrails?: None Help needed moving from lying on your back to sitting on the side of a flat bed without using bedrails?: None Help needed moving to and from a bed to a chair (including a wheelchair)?: None Help needed standing up from a chair using your arms (e.g., wheelchair or bedside chair)?: None Help needed to walk in hospital room?: None Help needed climbing 3-5 steps with a railing? : A Little 6 Click Score: 23    End of Session   Activity Tolerance: Patient tolerated treatment well Patient left: in bed Nurse Communication: Mobility status PT  Visit Diagnosis: Unsteadiness on feet (R26.81);Other abnormalities of gait and mobility (R26.89);Muscle weakness (generalized) (M62.81)    Time: [id number] PT Time Calculation (min) (ACUTE ONLY): 20 min   Charges:   PT Evaluation $PT Eval Moderate Complexity: 1 Mod PT Treatments $Therapeutic Activity: 8-22 mins        10:34 AM, 10/12/20 Lonell Grandchild, MPT Physical Therapist with [facility] 336 [phone] office [phone] mobile phone

## 2020-10-12 NOTE — Consult Note (Signed)
[place] A. Merlene Laughter, MD     [email or web address]          Joanna [name] is an 50 y.o. female.   ASSESSMENT/PLAN: Acute small inferior cerebellar infarct: Risk factors poorly controlled hypertension, diabetes, previous infarct, obesity and OSA.  Dual antiplatelet agents are recommended for about 4 weeks.  Afterwards a single agent should suffice.  Vigorous risk factor modification is discussed. Remote infarcts: Remote infarcts are noted involving left putamen and right watershed  Acute COVID-19 infection: This appears to be mostly asymptomatic.      The patient presented with acute onset of moderate to severe throbbing headache with facial weakness and drawing of her face on the left side along with left hemiparesis.  Symptoms appear to have lasted for several minutes and has improved.  She reports being back to baseline at this time.  She denies any chest pain, palpitation, shortness of breath, GI or GU symptoms.  COVID-19 test has been positive on screening.  She denies any fevers, headaches or respiratory symptoms.  She has had a previous history of stroke in the past and was to be on Eliquis.  The reasons for Eliquis is unclear but patient tells me that she has not been on this medication for about 2 months.   GENERAL: She is currently doing well.  HEENT: Normal  ABDOMEN: soft  EXTREMITIES: No edema   BACK: Normal  SKIN: Normal by inspection.    MENTAL STATUS: Alert and oriented. Speech, language and cognition are generally intact. Judgment and insight normal.   CRANIAL NERVES: Pupils are equal, round and reactive to light and accomodation; extra ocular movements are full, there is no significant nystagmus; visual fields are full; upper and lower facial muscles are normal in strength and symmetric, there is no flattening of the nasolabial folds; tongue is midline; uvula is midline; shoulder elevation is normal.  MOTOR: Normal tone, bulk and strength; no  pronator drift.  COORDINATION: Left finger to nose is normal, right finger to nose is normal, No rest tremor; no intention tremor; no postural tremor; no bradykinesia.  REFLEXES: Deep tendon reflexes are symmetrical and normal.   SENSATION: Normal to light touch, temperature, and pain.      Blood pressure (!) 161/64, pulse 69, temperature 98.5 F (36.9 C), temperature source Axillary, resp. rate 16, height '5\' 5"'$  (1.651 m), weight 122.4 kg, SpO2 96 %.  Past Medical History:  Diagnosis Date   Anxiety    Dx at age 73   Arthritis    knee   CAD (coronary artery disease)    a. Inferior STEMI 07/2014 - s/p thrombectomy of RCA, no stenting.   Depression    at age 58; bipolar recent   Diabetes mellitus without complication ([place])    Diabetes type 2, uncontrolled ([place]) Dx 2010   DJD (degenerative joint disease)    Gout    Hyperlipidemia Dx 2010   Hypertension    Dx at age 31   IBS (irritable bowel syndrome)    Interstitial lung disease ([place]) Dx 2011   No biopsy   Lung disorder Dx 2011   Morbid obesity ([facility])    Neuropathy    Non compliance w medication regimen    PCOS (polycystic ovarian syndrome) 1988   no children    Sleep apnea    STEMI (ST elevation myocardial infarction) ([place]) 07/23/2014   Stroke ([facility])    Tobacco abuse     Past Surgical History:  Procedure Laterality Date   CARDIAC  CATHETERIZATION N/A 07/23/2014   Procedure: Left Heart Cath and Coronary Angiography;  Surgeon: Peter M Martinique, MD;  Location: [facility];  Service: Cardiovascular;  Laterality: N/A;   CARDIAC CATHETERIZATION N/A 07/27/2014   Procedure: Left Heart Cath and Coronary Angiography;  Surgeon: Jettie Booze, MD;  Location: [facility];  Service: Cardiovascular;  Laterality: N/A;   CARDIAC CATHETERIZATION N/A 07/27/2014   Procedure: Coronary Balloon Angioplasty;  Surgeon: Jettie Booze, MD;  Location: [facility];  Service: Cardiovascular;  Laterality: N/A;   thrombectomy only   CORONARY ANGIOPLASTY     TEE WITHOUT CARDIOVERSION N/A 01/21/2020   Procedure: TRANSESOPHAGEAL ECHOCARDIOGRAM (TEE);  Surgeon: Kate Sable, MD;  Location: [facility];  Service: Cardiovascular;  Laterality: N/A;    Family History  Problem Relation Age of Onset   Coronary artery disease Father 84       CABG   Lung cancer Father    Heart disease Father    Diabetes Father    Hypertension Father    Hyperlipidemia Father    Drug abuse Father    Cancer Father        lung   Lung cancer Mother    Depression Mother    Hypertension Mother    Drug abuse Mother    Cancer Mother        lung cancer   Liver disease Brother        alcoholic cirrhosis >  HCC   COPD Brother    Asthma Brother    Depression Brother    Stroke Brother    Drug abuse Brother    Diabetes Sister    Thyroid disease Sister    Depression Sister    Heart disease Sister    Hypertension Sister    Cirrhosis Maternal Aunt    Cirrhosis Maternal Uncle    Breast cancer Paternal Aunt    Cancer Maternal Grandmother    Emphysema Maternal Grandfather    COPD Paternal Grandmother    Rheumatologic disease Neg Hx    Colon cancer Neg Hx    Colon polyps Neg Hx     Social History:  reports that she has been smoking cigarettes. She started smoking about 37 years ago. She has a 31.00 pack-year smoking history. She has never used smokeless tobacco. She reports that she does not drink alcohol and does not use drugs.  Allergies:  Allergies  Allergen Reactions   Wellbutrin [Bupropion] Rash    Medications: Prior to Admission medications   Medication Sig Start Date End Date Taking? Authorizing Provider  albuterol (VENTOLIN HFA) 108 (90 Base) MCG/ACT inhaler Inhale 1-2 puffs into the lungs every 6 (six) hours as needed for wheezing or shortness of breath. 07/27/09  Yes [provider]  atorvastatin (LIPITOR) 20 MG tablet Take 20 mg by mouth daily. 09/18/13  Yes [provider]   fluticasone-salmeterol (ADVAIR HFA) 115-21 MCG/ACT inhaler Inhale 2 puffs into the lungs 2 (two) times daily. 11/26/12  Yes [provider]  gabapentin (NEURONTIN) 300 MG capsule Take 300 mg by mouth 3 (three) times daily. 06/16/20  Yes [provider]  levonorgestrel (MIRENA) 20 MCG/DAY IUD by Intrauterine route. 08/28/12  Yes [provider]  traZODone (DESYREL) 50 MG tablet Take 50-100 mg by mouth at bedtime. 06/16/20  Yes [provider]  apixaban (ELIQUIS) 5 MG TABS tablet Take 2 tablets (10 mg total) by mouth 2 (two) times daily for 7 days. Patient not taking: No sig reported 01/22/20  08/19/20  Wyvonnia Dusky, MD  apixaban (ELIQUIS) 5 MG TABS tablet Take 1 tablet (5 mg total) by mouth 2 (two) times daily. Only start this eliquis script after taking eliquis '10mg'$  BID x 7 days Patient not taking: Reported on 10/12/2020 01/29/20 08/19/20  Wyvonnia Dusky, MD  atorvastatin (LIPITOR) 80 MG tablet Take 1 tablet (80 mg total) by mouth daily. Patient not taking: Reported on 08/19/2020 01/23/20 02/22/20  Wyvonnia Dusky, MD  insulin aspart (NOVOLOG) 100 UNIT/ML FlexPen Inject 4 Units into the skin 3 (three) times daily with meals. 01/22/20 08/19/20  Wyvonnia Dusky, MD  insulin detemir (LEVEMIR) 100 UNIT/ML FlexPen Inject 25 Units into the skin daily. 01/22/20 08/19/20  Wyvonnia Dusky, MD  Insulin Pen Needle (PEN NEEDLES) 32G X 5 MM MISC Enough pen needles for levermir 25 units daily & novolog 4 units TID w/ meals x 30 days. No refills 01/22/20   Wyvonnia Dusky, MD  lisinopril-hydrochlorothiazide (ZESTORETIC) 20-12.5 MG tablet Take 1 tablet by mouth daily. Patient not taking: Reported on 10/12/2020 06/16/20   [provider]  venlafaxine XR (EFFEXOR-XR) 37.5 MG 24 hr capsule Take 37.5 mg by mouth daily with breakfast. Patient not taking: Reported on 10/12/2020 06/16/20   [provider]    Scheduled Meds:   stroke: mapping our early  stages of recovery book   Does not apply Once   aspirin EC  81 mg Oral Daily   atorvastatin  20 mg Oral Daily   clopidogrel  75 mg Oral Daily   gabapentin  300 mg Oral TID   heparin  5,000 Units Subcutaneous Q8H   insulin aspart  0-15 Units Subcutaneous TID WC   insulin aspart  0-5 Units Subcutaneous QHS   insulin aspart  6 Units Subcutaneous TID WC   insulin detemir  25 Units Subcutaneous Daily   mometasone-formoterol  2 puff Inhalation BID   traZODone  50-100 mg Oral QHS   Continuous Infusions:  sodium chloride     famotidine (PEPCID) IV     PRN Meds:.sodium chloride, acetaminophen **OR** acetaminophen (TYLENOL) oral liquid 160 mg/5 mL **OR** acetaminophen, albuterol, albuterol, diphenhydrAMINE, EPINEPHrine, famotidine (PEPCID) IV, labetalol, methylPREDNISolone (SOLU-MEDROL) injection, senna-docusate     Results for orders placed or performed during the hospital encounter of 10/12/20 (from the past 48 hour(s))  Ethanol     Status: None   Collection Time: 10/12/20  1:28 AM  Result Value Ref Range   Alcohol, Ethyl (B) <10 <10 mg/dL    Comment: (NOTE) Lowest detectable limit for serum alcohol is 10 mg/dL.  For medical purposes only. Performed at [facility], [address] [place], [place] [address]   Protime-INR     Status: None   Collection Time: 10/12/20  1:28 AM  Result Value Ref Range   Prothrombin Time 13.5 11.4 - 15.2 seconds   INR 1.0 0.8 - 1.2    Comment: (NOTE) INR goal varies based on device and disease states. Performed at [facility], [address], [place], [place] [address]   APTT     Status: None   Collection Time: 10/12/20  1:28 AM  Result Value Ref Range   aPTT 28 24 - 36 seconds    Comment: Performed at [facility], [address], [place], [place] [address]  CBC     Status: Abnormal   Collection Time: 10/12/20  1:28 AM  Result Value Ref Range   WBC 9.3 4.0 - 10.5 K/uL   RBC 5.50 (H)  3.87 - 5.11 MIL/uL   Hemoglobin 15.9 (H) 12.0 - 15.0 g/dL    HCT 47.4 (H) 36.0 - 46.0 %   MCV 86.2 80.0 - 100.0 fL   MCH 28.9 26.0 - 34.0 pg   MCHC 33.5 30.0 - 36.0 g/dL   RDW 13.1 11.5 - 15.5 %   Platelets 190 150 - 400 K/uL   nRBC 0.0 0.0 - 0.2 %    Comment: Performed at [facility], [address] [place], [place] [address]  Differential     Status: None   Collection Time: 10/12/20  1:28 AM  Result Value Ref Range   Neutrophils Relative % 60 %   Neutro Abs 5.6 1.7 - 7.7 K/uL   Lymphocytes Relative 31 %   Lymphs Abs 2.8 0.7 - 4.0 K/uL   Monocytes Relative 7 %   Monocytes Absolute 0.6 0.1 - 1.0 K/uL   Eosinophils Relative 1 %   Eosinophils Absolute 0.1 0.0 - 0.5 K/uL   Basophils Relative 1 %   Basophils Absolute 0.1 0.0 - 0.1 K/uL   Immature Granulocytes 0 %   Abs Immature Granulocytes 0.04 0.00 - 0.07 K/uL    Comment: Performed at [facility], [address] [place], [place] [address]  Comprehensive metabolic panel     Status: Abnormal   Collection Time: 10/12/20  1:28 AM  Result Value Ref Range   Sodium 132 (L) 135 - 145 mmol/L   Potassium 3.4 (L) 3.5 - 5.1 mmol/L   Chloride 100 98 - 111 mmol/L   CO2 28 22 - 32 mmol/L   Glucose, Bld 262 (H) 70 - 99 mg/dL    Comment: Glucose reference range applies only to samples taken after fasting for at least 8 hours.   BUN 9 6 - 20 mg/dL   Creatinine, Ser 0.69 0.44 - 1.00 mg/dL   Calcium 8.4 (L) 8.9 - 10.3 mg/dL   Total Protein 7.1 6.5 - 8.1 g/dL   Albumin 3.4 (L) 3.5 - 5.0 g/dL   AST 23 15 - 41 U/L   ALT 27 0 - 44 U/L   Alkaline Phosphatase 95 38 - 126 U/L   Total Bilirubin 0.7 0.3 - 1.2 mg/dL   GFR, Estimated >60 >60 mL/min    Comment: (NOTE) Calculated using the CKD-EPI Creatinine Equation (2021)    Anion gap 4 (L) 5 - 15    Comment: Performed at [facility], [address], [place], [place] [address]  Resp Panel by RT-PCR (Flu A&B, Covid) Nasopharyngeal Swab     Status: Abnormal   Collection Time: 10/12/20  1:34 AM   Specimen: Nasopharyngeal Swab; Nasopharyngeal(NP) swabs in vial  transport medium  Result Value Ref Range   SARS Coronavirus 2 by RT PCR POSITIVE (A) NEGATIVE    Comment: RESULT CALLED TO, READ BACK BY AND VERIFIED WITH: Gessell,C'@0247'$  by [email or web address], b 9.6.22 (NOTE) SARS-CoV-2 target nucleic acids are DETECTED.  The SARS-CoV-2 RNA is generally detectable in upper respiratory specimens during the acute phase of infection. Positive results are indicative of the presence of the identified virus, but do not rule out bacterial infection or co-infection with other pathogens not detected by the test. Clinical correlation with patient history and other diagnostic information is necessary to determine patient infection status. The expected result is Negative.  Fact Sheet for Patients: EntrepreneurPulse.com.au  Fact Sheet for Healthcare Providers: IncredibleEmployment.be  This test is not yet approved or cleared by the [place] FDA and  has been authorized for detection and/or  diagnosis of SARS-CoV-2 by FDA under an Emergency Use Authorization (EUA).  This EUA will remain in effect (meaning this test can  be used) for the duration of  the COVID-19 declaration under Section 564(b)(1) of the Act, 21 U.S.C. section 360bbb-3(b)(1), unless the authorization is terminated or revoked sooner.     Influenza A by PCR NEGATIVE NEGATIVE   Influenza B by PCR NEGATIVE NEGATIVE    Comment: (NOTE) The Xpert Xpress SARS-CoV-2/FLU/RSV plus assay is intended as an aid in the diagnosis of influenza from Nasopharyngeal swab specimens and should not be used as a sole basis for treatment. Nasal washings and aspirates are unacceptable for Xpert Xpress SARS-CoV-2/FLU/RSV testing.  Fact Sheet for Patients: EntrepreneurPulse.com.au  Fact Sheet for Healthcare Providers: IncredibleEmployment.be  This test is not yet approved or cleared by the [place] FDA and has been authorized for detection  and/or diagnosis of SARS-CoV-2 by FDA under an Emergency Use Authorization (EUA). This EUA will remain in effect (meaning this test can be used) for the duration of the COVID-19 declaration under Section 564(b)(1) of the Act, 21 U.S.C. section 360bbb-3(b)(1), unless the authorization is terminated or revoked.  Performed at [facility], [address], [place], [place] [address]   I-Stat Beta hCG blood, ED ([facility], [facility], [facility] only)     Status: None   Collection Time: 10/12/20  1:38 AM  Result Value Ref Range   I-stat hCG, quantitative <5.0 <5 mIU/mL   Comment 3            Comment:   GEST. AGE      CONC.  (mIU/mL)   <=1 WEEK        5 - 50     2 WEEKS       50 - 500     3 WEEKS       100 - 10,000     4 WEEKS     1,000 - 30,000        FEMALE AND NON-PREGNANT FEMALE:     LESS THAN 5 mIU/mL   I-stat chem 8, ED     Status: Abnormal   Collection Time: 10/12/20  1:40 AM  Result Value Ref Range   Sodium 136 135 - 145 mmol/L   Potassium 3.7 3.5 - 5.1 mmol/L   Chloride 97 (L) 98 - 111 mmol/L   BUN 9 6 - 20 mg/dL   Creatinine, Ser 0.60 0.44 - 1.00 mg/dL   Glucose, Bld 265 (H) 70 - 99 mg/dL    Comment: Glucose reference range applies only to samples taken after fasting for at least 8 hours.   Calcium, Ion 1.12 (L) 1.15 - 1.40 mmol/L   TCO2 30 22 - 32 mmol/L   Hemoglobin 16.0 (H) 12.0 - 15.0 g/dL   HCT 47.0 (H) 36.0 - 46.0 %  Hemoglobin A1c     Status: Abnormal   Collection Time: 10/12/20  6:05 AM  Result Value Ref Range   Hgb A1c MFr Bld 12.7 (H) 4.8 - 5.6 %    Comment: (NOTE) Pre diabetes:          5.7%-6.4%  Diabetes:              >6.4%  Glycemic control for   <7.0% adults with diabetes    Mean Plasma Glucose 317.79 mg/dL    Comment: Performed at [facility] [address] [place], [place] [address]  Lipid panel     Status: Abnormal   Collection Time: 10/12/20  6:05 AM  Result Value Ref Range   Cholesterol 137 0 - 200 mg/dL   Triglycerides 308 (H) <150 mg/dL   HDL 21 (L)  >40 mg/dL   Total CHOL/HDL Ratio 6.5 RATIO   VLDL 62 (H) 0 - 40 mg/dL   LDL Cholesterol 54 0 - 99 mg/dL    Comment:        Total Cholesterol/HDL:CHD Risk Coronary Heart Disease Risk Table                     Men   Women  1/2 Average Risk   3.4   3.3  Average Risk       5.0   4.4  2 X Average Risk   9.6   7.1  3 X Average Risk  23.4   11.0        Use the calculated Patient Ratio above and the CHD Risk Table to determine the patient's CHD Risk.        ATP III CLASSIFICATION (LDL):  <100     mg/dL   Optimal  100-129  mg/dL   Near or Above                    Optimal  130-159  mg/dL   Borderline  160-189  mg/dL   High  >190     mg/dL   Very High Performed at [facility] [place], [place] [address]   Creatinine, serum     Status: None   Collection Time: 10/12/20  6:05 AM  Result Value Ref Range   Creatinine, Ser 0.61 0.44 - 1.00 mg/dL   GFR, Estimated >60 >60 mL/min    Comment: (NOTE) Calculated using the CKD-EPI Creatinine Equation (2021) Performed at [facility], [address], [place], [place] [address]   Glucose, capillary     Status: Abnormal   Collection Time: 10/12/20  8:40 AM  Result Value Ref Range   Glucose-Capillary 178 (H) 70 - 99 mg/dL    Comment: Glucose reference range applies only to samples taken after fasting for at least 8 hours.  Glucose, capillary     Status: Abnormal   Collection Time: 10/12/20 12:14 PM  Result Value Ref Range   Glucose-Capillary 234 (H) 70 - 99 mg/dL    Comment: Glucose reference range applies only to samples taken after fasting for at least 8 hours.  Glucose, capillary     Status: Abnormal   Collection Time: 10/12/20  4:51 PM  Result Value Ref Range   Glucose-Capillary 135 (H) 70 - 99 mg/dL    Comment: Glucose reference range applies only to samples taken after fasting for at least 8 hours.    Studies/Results:   HEAD NECK CTA 1. Persistent irregular filling defect involving the distal right CCA as above,  consistent with intraluminal thrombus, possibly related to a ruptured plaque and/or short-segment dissection. Widely similar finding with seen at this location on prior CTA from 01/20/2020, overall appearance and morphology on today's exam appears somewhat acute in nature and slightly worsened from prior, raising the possibility for a superimposed acute component. Associated stenosis of up to 75% at this level. 2. No visible downstream embolic occlusion or other large vessel occlusion. 3. Additional scattered atheromatous change about the carotid bifurcations and carotid siphons without hemodynamically significant stenosis. 4. Eccentric filling defect involving the distal straight sinus as above, indeterminate. While this could reflect a prominent arachnoid granulation, possible chronic thrombus and/or fibrinous change could  also have this appearance. This was also seen on prior CTA from 2021. 5. Scattered hazy ground-glass opacity within the visualized lungs, favored to reflect a degree of mild pulmonary interstitial congestion/edema.      BRAIN MRI FINDINGS: Brain: There is a small acute to subacute infarct in the left cerebellar hemisphere.   There are small remote infarcts in the right frontal, parietal, and occipital lobes as seen on the prior MRI from 2021. Small foci of T2* hypointensity associated with the frontal and parietal lobe infarcts are unchanged.   Additional foci of FLAIR signal abnormality in the bilateral subcortical and periventricular white matter are nonspecific but may reflect sequela of chronic white matter microangiopathy. There is no mass lesion. There is no abnormal enhancement. There is no midline shift. The ventricles are not enlarged. Incidental note is made of a cavum septum pellucidum.   Vascular: Normal flow voids.   Skull and upper cervical spine: Normal marrow signal.   Sinuses/Orbits: The paranasal sinuses are clear. The globes  and orbits are unremarkable.   Other: There is a small retention cyst in the right nasopharynx. Prominent adenoid tonsils are unchanged.   IMPRESSION: 1. Small acute to subacute infarct in the left cerebellar hemisphere. 2. Mild chronic white matter microangiopathy and small remote infarcts in the right cerebral hemisphere as above.       Brain MRI is reviewed in person and shows acute infarct involving the inferior cerebellum on the left side. No hemorrhages noted. Remote infarcts are noted involving left putamen and right watershed -  posterior area between the occipital and parietal lobes. This infarct mostly is limited to the cortical rim area.     Reginna Sermeno A. Merlene Laughter, M.D.  Diplomate, Tax adviser of Psychiatry and Neurology ( Neurology). 10/12/2020, 5:30 PM

## 2020-10-12 NOTE — Plan of Care (Addendum)
Pt is alert and oriented x4.  Pt is cooperative with care.  Pt remains on airborne precautions due to covid positive.  Instructed pt to call for any assistance.  Pt educated on signs and symptoms of stroke and when to call.   Problem: Education: Goal: Knowledge of General Education information will improve Description: Including pain rating scale, medication(s)/side effects and non-pharmacologic comfort measures Outcome: Progressing   Problem: Health Behavior/Discharge Planning: Goal: Ability to manage health-related needs will improve Outcome: Progressing   Problem: Clinical Measurements: Goal: Ability to maintain clinical measurements within normal limits will improve Outcome: Progressing Goal: Will remain free from infection Outcome: Progressing Goal: Diagnostic test results will improve Outcome: Progressing Goal: Respiratory complications will improve Outcome: Progressing Goal: Cardiovascular complication will be avoided Outcome: Progressing   Problem: Activity: Goal: Risk for activity intolerance will decrease Outcome: Progressing   Problem: Nutrition: Goal: Adequate nutrition will be maintained Outcome: Progressing   Problem: Coping: Goal: Level of anxiety will decrease Outcome: Progressing   Problem: Elimination: Goal: Will not experience complications related to bowel motility Outcome: Progressing Goal: Will not experience complications related to urinary retention Outcome: Progressing   Problem: Pain Managment: Goal: General experience of comfort will improve Outcome: Progressing   Problem: Safety: Goal: Ability to remain free from injury will improve Outcome: Progressing   Problem: Skin Integrity: Goal: Risk for impaired skin integrity will decrease Outcome: Progressing   Problem: Education: Goal: Knowledge of disease or condition will improve Outcome: Progressing Goal: Knowledge of secondary prevention will improve Outcome: Progressing Goal:  Knowledge of patient specific risk factors addressed and post discharge goals established will improve Outcome: Progressing Goal: Individualized Educational Video(s) Outcome: Progressing   Problem: Coping: Goal: Will verbalize positive feelings about self Outcome: Progressing Goal: Will identify appropriate support needs Outcome: Progressing   Problem: Health Behavior/Discharge Planning: Goal: Ability to manage health-related needs will improve Outcome: Progressing   Problem: Self-Care: Goal: Ability to participate in self-care as condition permits will improve Outcome: Progressing   Problem: Nutrition: Goal: Risk of aspiration will decrease Outcome: Progressing Goal: Dietary intake will improve Outcome: Progressing   Problem: Ischemic Stroke/TIA Tissue Perfusion: Goal: Complications of ischemic stroke/TIA will be minimized Outcome: Progressing

## 2020-10-12 NOTE — Progress Notes (Signed)
ASSUMPTION OF CARE NOTE   10/12/2020 10:28 AM  Joanna [name] was seen and examined.  The H&P by the admitting provider, orders, imaging was reviewed.  Please see new orders.  Will continue to follow.    Impression/Plan  Acute CVA left cerebellar hemisphere - Pt admits that she stopped taking her apixaban and other medications due to bout of severe depression and she is not following up for outpatient visits.   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  She was supposed to be fully anticoagulated for at least 3 months with follow up vascular imaging.  It does not appear that patient ever followed up with neurology.  She is on plavix and aspirin now started last night.  Continue full stroke work up.  Follow 2D Echo, neuro checks, PT/OT/SLP eval, telemetry monitoring. Right Carotid artery stenosis - in Dec 2021 Pt was noted to have 50-69% stenosis of right carotid artery and less than 50% stenosis of left carotid artery.   Severe depression - Pt says that effexor was not effective. TTS consult for new recommendations for severe depression.  Essential hypertension - allowing permissive hypertension in setting of acute CVA.  Covid Infection - asymptomatic; did not start paxlovid due to STATIN interaction in setting of acute CVA and need to take statin therapy.  She is high risk for covid complication.  Therefore will offer monoclonal antibody therapy.  Type 2 DM with vascular complications uncontrolled as evidenced by an A1c>12%.  Pt has not been taking her medication due to severe depression.  See DM orders.  Monitor CBG closely.  Dyslipidemia - Lipids will improve with better control of blood glucose. Resumed home statin therapy.  Hypokalemia - repleted, recheck in AM.  Tobacco use - strongly advised patient to avoid all tobacco use.    Vitals:   10/12/20 0520  10/12/20 0715  BP: (!) 160/74 (!) 158/62  Pulse: (!) 58 (!) 56  Resp: 20 18  Temp: 98.2 F (36.8 C) 98.4 F (36.9 C)  SpO2: 96% 97%    Results for orders placed or performed during the hospital encounter of 10/12/20  Resp Panel by RT-PCR (Flu A&B, Covid) Nasopharyngeal Swab   Specimen: Nasopharyngeal Swab; Nasopharyngeal(NP) swabs in vial transport medium  Result Value Ref Range   SARS Coronavirus 2 by RT PCR POSITIVE (A) NEGATIVE   Influenza A by PCR NEGATIVE NEGATIVE   Influenza B by PCR NEGATIVE NEGATIVE  Ethanol  Result Value Ref Range   Alcohol, Ethyl (B) <10 <10 mg/dL  Protime-INR  Result Value Ref Range   Prothrombin Time 13.5 11.4 - 15.2 seconds   INR 1.0 0.8 - 1.2  APTT  Result Value Ref Range   aPTT 28 24 - 36 seconds  CBC  Result Value Ref Range   WBC 9.3 4.0 - 10.5 K/uL   RBC 5.50 (H) 3.87 - 5.11 MIL/uL   Hemoglobin 15.9 (H) 12.0 - 15.0 g/dL   HCT 47.4 (H) 36.0 - 46.0 %   MCV 86.2 80.0 - 100.0 fL   MCH 28.9 26.0 - 34.0 pg   MCHC 33.5 30.0 - 36.0 g/dL   RDW 13.1 11.5 - 15.5 %   Platelets 190 150 - 400 K/uL   nRBC 0.0 0.0 - 0.2 %  Differential  Result Value Ref Range   Neutrophils Relative % 60 %  Neutro Abs 5.6 1.7 - 7.7 K/uL   Lymphocytes Relative 31 %   Lymphs Abs 2.8 0.7 - 4.0 K/uL   Monocytes Relative 7 %   Monocytes Absolute 0.6 0.1 - 1.0 K/uL   Eosinophils Relative 1 %   Eosinophils Absolute 0.1 0.0 - 0.5 K/uL   Basophils Relative 1 %   Basophils Absolute 0.1 0.0 - 0.1 K/uL   Immature Granulocytes 0 %   Abs Immature Granulocytes 0.04 0.00 - 0.07 K/uL  Comprehensive metabolic panel  Result Value Ref Range   Sodium 132 (L) 135 - 145 mmol/L   Potassium 3.4 (L) 3.5 - 5.1 mmol/L   Chloride 100 98 - 111 mmol/L   CO2 28 22 - 32 mmol/L   Glucose, Bld 262 (H) 70 - 99 mg/dL   BUN 9 6 - 20 mg/dL   Creatinine, Ser 0.69 0.44 - 1.00 mg/dL   Calcium 8.4 (L) 8.9 - 10.3 mg/dL   Total Protein 7.1 6.5 - 8.1 g/dL   Albumin 3.4 (L) 3.5 - 5.0 g/dL   AST 23  15 - 41 U/L   ALT 27 0 - 44 U/L   Alkaline Phosphatase 95 38 - 126 U/L   Total Bilirubin 0.7 0.3 - 1.2 mg/dL   GFR, Estimated >60 >60 mL/min   Anion gap 4 (L) 5 - 15  Hemoglobin A1c  Result Value Ref Range   Hgb A1c MFr Bld 12.7 (H) 4.8 - 5.6 %   Mean Plasma Glucose 317.79 mg/dL  Lipid panel  Result Value Ref Range   Cholesterol 137 0 - 200 mg/dL   Triglycerides 308 (H) <150 mg/dL   HDL 21 (L) >40 mg/dL   Total CHOL/HDL Ratio 6.5 RATIO   VLDL 62 (H) 0 - 40 mg/dL   LDL Cholesterol 54 0 - 99 mg/dL  Creatinine, serum  Result Value Ref Range   Creatinine, Ser 0.61 0.44 - 1.00 mg/dL   GFR, Estimated >60 >60 mL/min  Glucose, capillary  Result Value Ref Range   Glucose-Capillary 178 (H) 70 - 99 mg/dL  I-stat chem 8, ED  Result Value Ref Range   Sodium 136 135 - 145 mmol/L   Potassium 3.7 3.5 - 5.1 mmol/L   Chloride 97 (L) 98 - 111 mmol/L   BUN 9 6 - 20 mg/dL   Creatinine, Ser 0.60 0.44 - 1.00 mg/dL   Glucose, Bld 265 (H) 70 - 99 mg/dL   Calcium, Ion 1.12 (L) 1.15 - 1.40 mmol/L   TCO2 30 22 - 32 mmol/L   Hemoglobin 16.0 (H) 12.0 - 15.0 g/dL   HCT 47.0 (H) 36.0 - 46.0 %  I-Stat Beta hCG blood, ED ([facility], [facility], [facility] only)  Result Value Ref Range   I-stat hCG, quantitative <5.0 <5 mIU/mL   Comment 3          ECHOCARDIOGRAM COMPLETE  Result Value Ref Range   Weight 4,318.4 oz   Height 65 in   BP 158/62 mmHg   Murvin Natal, MD [facility]   10/12/2020 12:54 AM How to contact the [facility] Attending or Consulting provider [place] or covering provider during after hours 7P -7A, for this patient?  Check the care team in [facility] and look for a) attending/consulting [facility] provider listed and b) the [facility] team listed Log into [email or web address] and use [place] universal password to access. If you do not have the password, please contact the hospital operator. Locate the [facility] provider you are looking for under [facility]  and page to a number that you can be directly reached. If you still  have difficulty reaching the provider, please page the [facility] (Director on Call) for the Hospitalists listed on amion for assistance.

## 2020-10-13 ENCOUNTER — Encounter (HOSPITAL_COMMUNITY): Payer: Self-pay | Admitting: Family Medicine

## 2020-10-13 DIAGNOSIS — I639 Cerebral infarction, unspecified: Secondary | ICD-10-CM | POA: Diagnosis not present

## 2020-10-13 DIAGNOSIS — F32A Depression, unspecified: Secondary | ICD-10-CM | POA: Diagnosis not present

## 2020-10-13 DIAGNOSIS — F419 Anxiety disorder, unspecified: Secondary | ICD-10-CM | POA: Diagnosis not present

## 2020-10-13 DIAGNOSIS — E119 Type 2 diabetes mellitus without complications: Secondary | ICD-10-CM

## 2020-10-13 DIAGNOSIS — I10 Essential (primary) hypertension: Secondary | ICD-10-CM

## 2020-10-13 DIAGNOSIS — I2510 Atherosclerotic heart disease of native coronary artery without angina pectoris: Secondary | ICD-10-CM

## 2020-10-13 DIAGNOSIS — Z79899 Other long term (current) drug therapy: Secondary | ICD-10-CM

## 2020-10-13 LAB — GLUCOSE, CAPILLARY
Glucose-Capillary: 227 mg/dL — ABNORMAL HIGH (ref 70–99)
Glucose-Capillary: 222 mg/dL — ABNORMAL HIGH (ref 70–99)

## 2020-10-13 LAB — BASIC METABOLIC PANEL
Anion gap: 9 (ref 5–15)
BUN: 9 mg/dL (ref 6–20)
CO2: 27 mmol/L (ref 22–32)
Calcium: 8.6 mg/dL — ABNORMAL LOW (ref 8.9–10.3)
Chloride: 103 mmol/L (ref 98–111)
Creatinine, Ser: 0.59 mg/dL (ref 0.44–1.00)
GFR, Estimated: >60 mL/min (ref >60)
Glucose, Bld: 193 mg/dL — ABNORMAL HIGH (ref 70–99)
Potassium: 3.7 mmol/L (ref 3.5–5.1)
Sodium: 139 mmol/L (ref 135–145)

## 2020-10-13 LAB — CBC
HCT: 47.4 % — ABNORMAL HIGH (ref 36.0–46.0)
Hemoglobin: 15.4 g/dL — ABNORMAL HIGH (ref 12.0–15.0)
MCH: 28.3 pg (ref 26.0–34.0)
MCHC: 32.5 g/dL (ref 30.0–36.0)
MCV: 87.1 fL (ref 80.0–100.0)
Platelets: 180 10*3/uL (ref 150–400)
RBC: 5.44 MIL/uL — ABNORMAL HIGH (ref 3.87–5.11)
RDW: 12.9 % (ref 11.5–15.5)
WBC: 7 10*3/uL (ref 4.0–10.5)
nRBC: 0 % (ref 0.0–0.2)

## 2020-10-13 LAB — MAGNESIUM: Magnesium: 2 mg/dL (ref 1.7–2.4)

## 2020-10-13 MED ORDER — INSULIN DETEMIR 100 UNIT/ML FLEXPEN: 25.0000 [IU] | PEN_INJECTOR | Freq: Every day | SUBCUTANEOUS | 0 refills | Status: AC

## 2020-10-13 MED ORDER — PEN NEEDLES 32G X 5 MM MISC: 0 refills | Status: AC

## 2020-10-13 MED ORDER — ASPIRIN 81 MG PO TBEC: 81.0000 mg | DELAYED_RELEASE_TABLET | Freq: Every day | ORAL | 11 refills | Status: AC

## 2020-10-13 MED ORDER — INSULIN ASPART 100 UNIT/ML FLEXPEN: 4.0000 [IU] | PEN_INJECTOR | Freq: Three times a day (TID) | SUBCUTANEOUS | 0 refills | Status: AC

## 2020-10-13 MED ORDER — VENLAFAXINE HCL ER 37.5 MG PO CP24
37.5000 mg | ORAL_CAPSULE | Freq: Every day | ORAL | Status: DC
Start: 2020-10-14 — End: 2020-10-13
  Filled 2020-10-13 (×2): qty 1

## 2020-10-13 MED ORDER — CLOPIDOGREL BISULFATE 75 MG PO TABS: 75.0000 mg | ORAL_TABLET | Freq: Every day | ORAL | 2 refills | Status: AC

## 2020-10-13 MED ORDER — VENLAFAXINE HCL ER 37.5 MG PO CP24: 37.5000 mg | ORAL_CAPSULE | Freq: Every day | ORAL | 3 refills | Status: AC

## 2020-10-13 MED ORDER — LISINOPRIL-HYDROCHLOROTHIAZIDE 20-12.5 MG PO TABS: 1.0000 | ORAL_TABLET | Freq: Every day | ORAL | 3 refills | Status: AC

## 2020-10-13 NOTE — Consult Note (Signed)
  Spoke to patient's nurse Alma Friendly, RN states she will call tele health machine once she has gotten it set up in patient's room.

## 2020-10-13 NOTE — Consult Note (Addendum)
Telepsych Consultation   Reason for Consult:  Medication management worsening depression Referring Physician:  Murlean Iba, MD Location of Patient: [facility] 341/01 Location of Provider: Other: [facility] Handing  Patient Identification: Joanna [name] MRN:  [id number] Principal Diagnosis: Stroke [facility]) Diagnosis:  Principal Problem:   Stroke [facility]) Active Problems:   Interstitial lung disease ([place])   Hypertension   Morbid obesity with BMI of 50.0-59.9, adult ([place])   Non compliance w medication regimen   Tobacco use disorder   Family history of coronary artery disease in father   CAD (coronary artery disease)   Agoraphobia   Generalized anxiety disorder   OSA (obstructive sleep apnea)   GERD (gastroesophageal reflux disease)   Cigarette nicotine dependence without complication   Type 2 diabetes mellitus with hyperlipidemia ([place])   COVID-19 virus infection   Total Time spent with patient: 30 minutes  Subjective:   Joanna [name] is a 50 y.o. female patient with a history of hypertension, diabetes, CAD, and stroke admitted to [facility] ED after presenting with complaint of weakness, sudden onset of left-side headache followed by left-side weakness.     HPI:  Joanna [name], 50 y.o., female patient seen via tele health by this provider, consulted with Dr. Hampton Abbot; and chart reviewed on 10/13/20.  On evaluation Joanna [name] reports she came to the hospital because she was having a stroke.  Patient states that she has a history of depression and has been told she has bipolar disorder "Not really sure what my diagnosis is."  Patient states she is treated by her primary care doctor for depression and anxiety.  "I was on Effexor 150 mg one time and it worked.  I stopped it for several months and when they restarted it I guess it was at a lower dose and I didn't feel like it was working so I stopped."  Patient educated that medication had to be started at beginning dose and then titrate up to the 150 mg; didn't  restart at 150 mg after being off for so long because didn't want to cause any adverse reactions to the medications. Informed would restart at the 37.5 and if tolerating well next to follow up with doctor to increase to the next dose.  Also discussed seeing a  outpatient psychiatric provider and patient states she interested.  Patient denies suicidal/self-harm/homicidal ideation, psychosis, and paranoia.  Patient reporting a chronic history of depression.  Patient denies prior suicide attempt and self harming behavior.   During evaluation Joanna [name] is elevated up in bed in no acute distress.  She is alert, oriented x 4, calm and cooperative.  Her mood is dysphoric with congruent affect.  She does not appear to be responding to internal/external stimuli or delusional thoughts.  Patient denies suicidal/self-harm/homicidal ideation, psychosis, and paranoia.  Patient answered question appropriately.  At this time Joanna [name] is educated and verbalizes understanding of mental health resources and other crisis services in the community. She is instructed to call 911 and present to the nearest emergency room should she experience any suicidal/homicidal ideation, auditory/visual/hallucinations, or detrimental worsening of her mental health condition.  Resources added to AVS for outpatient psychiatric services.  Patient to follow up with PCP until she has seen psychiatrist for medication management.    Past Psychiatric History: Anxiety, MDD  Risk to Self:  Denies Risk to Others:  Denies Prior Inpatient Therapy:  Yes Prior Outpatient Therapy:  Yes  Past Medical History:  Past Medical History:  Diagnosis Date   Anxiety  Dx at age 25   Arthritis    knee   CAD (coronary artery disease)    a. Inferior STEMI 07/2014 - s/p thrombectomy of RCA, no stenting.   Depression    at age 29; bipolar recent   Diabetes mellitus without complication ([place])    Diabetes type 2, uncontrolled ([place]) Dx 2010   DJD  (degenerative joint disease)    Gout    Hyperlipidemia Dx 2010   Hypertension    Dx at age 56   IBS (irritable bowel syndrome)    Interstitial lung disease ([place]) Dx 2011   No biopsy   Lung disorder Dx 2011   Morbid obesity ([facility])    Neuropathy    Non compliance w medication regimen    PCOS (polycystic ovarian syndrome) 1988   no children    Sleep apnea    STEMI (ST elevation myocardial infarction) ([place]) 07/23/2014   Stroke ([place])    Tobacco abuse     Past Surgical History:  Procedure Laterality Date   CARDIAC CATHETERIZATION N/A 07/23/2014   Procedure: Left Heart Cath and Coronary Angiography;  Surgeon: Peter M Martinique, MD;  Location: [facility];  Service: Cardiovascular;  Laterality: N/A;   CARDIAC CATHETERIZATION N/A 07/27/2014   Procedure: Left Heart Cath and Coronary Angiography;  Surgeon: Jettie Booze, MD;  Location: [facility];  Service: Cardiovascular;  Laterality: N/A;   CARDIAC CATHETERIZATION N/A 07/27/2014   Procedure: Coronary Balloon Angioplasty;  Surgeon: Jettie Booze, MD;  Location: [facility];  Service: Cardiovascular;  Laterality: N/A;  thrombectomy only   CORONARY ANGIOPLASTY     TEE WITHOUT CARDIOVERSION N/A 01/21/2020   Procedure: TRANSESOPHAGEAL ECHOCARDIOGRAM (TEE);  Surgeon: Kate Sable, MD;  Location: [facility];  Service: Cardiovascular;  Laterality: N/A;   Family History:  Family History  Problem Relation Age of Onset   Coronary artery disease Father 45       CABG   Lung cancer Father    Heart disease Father    Diabetes Father    Hypertension Father    Hyperlipidemia Father    Drug abuse Father    Cancer Father        lung   Lung cancer Mother    Depression Mother    Hypertension Mother    Drug abuse Mother    Cancer Mother        lung cancer   Liver disease Brother        alcoholic cirrhosis >  HCC   COPD Brother    Asthma Brother    Depression Brother    Stroke Brother    Drug abuse Brother     Diabetes Sister    Thyroid disease Sister    Depression Sister    Heart disease Sister    Hypertension Sister    Cirrhosis Maternal Aunt    Cirrhosis Maternal Uncle    Breast cancer Paternal Aunt    Cancer Maternal Grandmother    Emphysema Maternal Grandfather    COPD Paternal Grandmother    Rheumatologic disease Neg Hx    Colon cancer Neg Hx    Colon polyps Neg Hx    Family Psychiatric  History: See above Social History:  Social History   Substance and Sexual Activity  Alcohol Use No   Alcohol/week: 0.0 standard drinks     Social History   Substance and Sexual Activity  Drug Use No    Social History   Socioeconomic History  Marital status: Single    Spouse name: Not on file   Number of children: 0   Years of education: Not on file   Highest education level: Not on file  Occupational History   Occupation: unemployed  Tobacco Use   Smoking status: Every Day    Packs/day: 1.00    Years: 31.00    Pack years: 31.00    Types: Cigarettes    Start date: 08/04/1983   Smokeless tobacco: Never   Tobacco comments:    about 10-15 cigs daily 09/28/14 - peak 2ppd  Vaping Use   Vaping Use: Never used  Substance and Sexual Activity   Alcohol use: No    Alcohol/week: 0.0 standard drinks   Drug use: No   Sexual activity: Never    Birth control/protection: I.U.D.  Other Topics Concern   Not on file  Social History Narrative   Originally from [place], [place]. She has only lived in [place]. She moved to [facility] in 2014-04-15 after her brother died. Currently lives with her sister. She has traveled to [place], [place], [place], [place], [place], [place]. Previously has worked as a Corporate investment banker, in [employer], & also in Data processing manager. She has not been able to keep a job because of her panic attacks. No international travel. Currently has a dog & cat in her residence. No bird, mold, or hot tub exposure. No asbestos exposure.    Social Determinants of Health   Financial Resource Strain: Not on file  Food  Insecurity: Not on file  Transportation Needs: Not on file  Physical Activity: Not on file  Stress: Not on file  Social Connections: Not on file   Additional Social History:    Allergies:   Allergies  Allergen Reactions   Wellbutrin [Bupropion] Rash    Labs:  Results for orders placed or performed during the hospital encounter of 10/12/20 (from the past 48 hour(s))  Ethanol     Status: None   Collection Time: 10/12/20  1:28 AM  Result Value Ref Range   Alcohol, Ethyl (B) <10 <10 mg/dL    Comment: (NOTE) Lowest detectable limit for serum alcohol is 10 mg/dL.  For medical purposes only. Performed at [facility], [address], [place], [place] [address]   Protime-INR     Status: None   Collection Time: 10/12/20  1:28 AM  Result Value Ref Range   Prothrombin Time 13.5 11.4 - 15.2 seconds   INR 1.0 0.8 - 1.2    Comment: (NOTE) INR goal varies based on device and disease states. Performed at [facility], [address] [place], [place] [address]   APTT     Status: None   Collection Time: 10/12/20  1:28 AM  Result Value Ref Range   aPTT 28 24 - 36 seconds    Comment: Performed at [facility], [address], [place],  [address]  CBC     Status: Abnormal   Collection Time: 10/12/20  1:28 AM  Result Value Ref Range   WBC 9.3 4.0 - 10.5 K/uL   RBC 5.50 (H) 3.87 - 5.11 MIL/uL   Hemoglobin 15.9 (H) 12.0 - 15.0 g/dL   HCT 47.4 (H) 36.0 - 46.0 %   MCV 86.2 80.0 - 100.0 fL   MCH 28.9 26.0 - 34.0 pg   MCHC 33.5 30.0 - 36.0 g/dL   RDW 13.1 11.5 - 15.5 %   Platelets 190 150 - 400 K/uL   nRBC 0.0 0.0 - 0.2 %  Comment: Performed at [facility], [address] [place], [place] [address]  Differential     Status: None   Collection Time: 10/12/20  1:28 AM  Result Value Ref Range   Neutrophils Relative % 60 %   Neutro Abs 5.6 1.7 - 7.7 K/uL   Lymphocytes Relative 31 %   Lymphs Abs 2.8 0.7 - 4.0 K/uL   Monocytes Relative 7 %   Monocytes Absolute 0.6 0.1 - 1.0 K/uL    Eosinophils Relative 1 %   Eosinophils Absolute 0.1 0.0 - 0.5 K/uL   Basophils Relative 1 %   Basophils Absolute 0.1 0.0 - 0.1 K/uL   Immature Granulocytes 0 %   Abs Immature Granulocytes 0.04 0.00 - 0.07 K/uL    Comment: Performed at [facility], [address], [place], [place] [address]  Comprehensive metabolic panel     Status: Abnormal   Collection Time: 10/12/20  1:28 AM  Result Value Ref Range   Sodium 132 (L) 135 - 145 mmol/L   Potassium 3.4 (L) 3.5 - 5.1 mmol/L   Chloride 100 98 - 111 mmol/L   CO2 28 22 - 32 mmol/L   Glucose, Bld 262 (H) 70 - 99 mg/dL    Comment: Glucose reference range applies only to samples taken after fasting for at least 8 hours.   BUN 9 6 - 20 mg/dL   Creatinine, Ser 0.69 0.44 - 1.00 mg/dL   Calcium 8.4 (L) 8.9 - 10.3 mg/dL   Total Protein 7.1 6.5 - 8.1 g/dL   Albumin 3.4 (L) 3.5 - 5.0 g/dL   AST 23 15 - 41 U/L   ALT 27 0 - 44 U/L   Alkaline Phosphatase 95 38 - 126 U/L   Total Bilirubin 0.7 0.3 - 1.2 mg/dL   GFR, Estimated >60 >60 mL/min    Comment: (NOTE) Calculated using the CKD-EPI Creatinine Equation (2021)    Anion gap 4 (L) 5 - 15    Comment: Performed at [facility], [address], [place],  [address]  Resp Panel by RT-PCR (Flu A&B, Covid) Nasopharyngeal Swab     Status: Abnormal   Collection Time: 10/12/20  1:34 AM   Specimen: Nasopharyngeal Swab; Nasopharyngeal(NP) swabs in vial transport medium  Result Value Ref Range   SARS Coronavirus 2 by RT PCR POSITIVE (A) NEGATIVE    Comment: RESULT CALLED TO, READ BACK BY AND VERIFIED WITH: Gessell,C'@[id number]'$  by [email or web address], b 9.6.22 (NOTE) SARS-CoV-2 target nucleic acids are DETECTED.  The SARS-CoV-2 RNA is generally detectable in upper respiratory specimens during the acute phase of infection. Positive results are indicative of the presence of the identified virus, but do not rule out bacterial infection or co-infection with other pathogens not detected by the test. Clinical  correlation with patient history and other diagnostic information is necessary to determine patient infection status. The expected result is Negative.  Fact Sheet for Patients: EntrepreneurPulse.com.au  Fact Sheet for Healthcare Providers: IncredibleEmployment.be  This test is not yet approved or cleared by the [place] FDA and  has been authorized for detection and/or diagnosis of SARS-CoV-2 by FDA under an Emergency Use Authorization (EUA).  This EUA will remain in effect (meaning this test can  be used) for the duration of  the COVID-19 declaration under Section 564(b)(1) of the Act, 21 U.S.C. section 360bbb-3(b)(1), unless the authorization is terminated or revoked sooner.     Influenza A by PCR NEGATIVE NEGATIVE   Influenza B by PCR NEGATIVE NEGATIVE    Comment: (  NOTE) The Xpert Xpress SARS-CoV-2/FLU/RSV plus assay is intended as an aid in the diagnosis of influenza from Nasopharyngeal swab specimens and should not be used as a sole basis for treatment. Nasal washings and aspirates are unacceptable for Xpert Xpress SARS-CoV-2/FLU/RSV testing.  Fact Sheet for Patients: EntrepreneurPulse.com.au  Fact Sheet for Healthcare Providers: IncredibleEmployment.be  This test is not yet approved or cleared by the [place] FDA and has been authorized for detection and/or diagnosis of SARS-CoV-2 by FDA under an Emergency Use Authorization (EUA). This EUA will remain in effect (meaning this test can be used) for the duration of the COVID-19 declaration under Section 564(b)(1) of the Act, 21 U.S.C. section 360bbb-3(b)(1), unless the authorization is terminated or revoked.  Performed at [facility], [address], [place], [place] [address]   I-Stat Beta hCG blood, ED ([facility], [facility], [facility] only)     Status: None   Collection Time: 10/12/20  1:38 AM  Result Value Ref Range   I-stat hCG, quantitative <5.0 <5  mIU/mL   Comment 3            Comment:   GEST. AGE      CONC.  (mIU/mL)   <=1 WEEK        5 - 50     2 WEEKS       50 - 500     3 WEEKS       100 - 10,000     4 WEEKS     1,000 - 30,000        FEMALE AND NON-PREGNANT FEMALE:     LESS THAN 5 mIU/mL   I-stat chem 8, ED     Status: Abnormal   Collection Time: 10/12/20  1:40 AM  Result Value Ref Range   Sodium 136 135 - 145 mmol/L   Potassium 3.7 3.5 - 5.1 mmol/L   Chloride 97 (L) 98 - 111 mmol/L   BUN 9 6 - 20 mg/dL   Creatinine, Ser 0.60 0.44 - 1.00 mg/dL   Glucose, Bld 265 (H) 70 - 99 mg/dL    Comment: Glucose reference range applies only to samples taken after fasting for at least 8 hours.   Calcium, Ion 1.12 (L) 1.15 - 1.40 mmol/L   TCO2 30 22 - 32 mmol/L   Hemoglobin 16.0 (H) 12.0 - 15.0 g/dL   HCT 47.0 (H) 36.0 - 46.0 %  Hemoglobin A1c     Status: Abnormal   Collection Time: 10/12/20  6:05 AM  Result Value Ref Range   Hgb A1c MFr Bld 12.7 (H) 4.8 - 5.6 %    Comment: (NOTE) Pre diabetes:          5.7%-6.4%  Diabetes:              >6.4%  Glycemic control for   <7.0% adults with diabetes    Mean Plasma Glucose 317.79 mg/dL    Comment: Performed at [facility] [address] [place], [place] [address]  Lipid panel     Status: Abnormal   Collection Time: 10/12/20  6:05 AM  Result Value Ref Range   Cholesterol 137 0 - 200 mg/dL   Triglycerides 308 (H) <150 mg/dL   HDL 21 (L) >40 mg/dL   Total CHOL/HDL Ratio 6.5 RATIO   VLDL 62 (H) 0 - 40 mg/dL   LDL Cholesterol 54 0 - 99 mg/dL    Comment:        Total Cholesterol/HDL:CHD Risk Coronary Heart Disease Risk  Table                     Men   Women  1/2 Average Risk   3.4   3.3  Average Risk       5.0   4.4  2 X Average Risk   9.6   7.1  3 X Average Risk  23.4   11.0        Use the calculated Patient Ratio above and the CHD Risk Table to determine the patient's CHD Risk.        ATP III CLASSIFICATION (LDL):  <100     mg/dL   Optimal  100-129  mg/dL   Near or  Above                    Optimal  130-159  mg/dL   Borderline  160-189  mg/dL   High  >190     mg/dL   Very High Performed at [facility] [place], [place] [address]   Creatinine, serum     Status: None   Collection Time: 10/12/20  6:05 AM  Result Value Ref Range   Creatinine, Ser 0.61 0.44 - 1.00 mg/dL   GFR, Estimated >60 >60 mL/min    Comment: (NOTE) Calculated using the CKD-EPI Creatinine Equation (2021) Performed at [facility], [address] [place], [place] [address]   Glucose, capillary     Status: Abnormal   Collection Time: 10/12/20  8:40 AM  Result Value Ref Range   Glucose-Capillary 178 (H) 70 - 99 mg/dL    Comment: Glucose reference range applies only to samples taken after fasting for at least 8 hours.  Glucose, capillary     Status: Abnormal   Collection Time: 10/12/20 12:14 PM  Result Value Ref Range   Glucose-Capillary 234 (H) 70 - 99 mg/dL    Comment: Glucose reference range applies only to samples taken after fasting for at least 8 hours.  Glucose, capillary     Status: Abnormal   Collection Time: 10/12/20  4:51 PM  Result Value Ref Range   Glucose-Capillary 135 (H) 70 - 99 mg/dL    Comment: Glucose reference range applies only to samples taken after fasting for at least 8 hours.  Glucose, capillary     Status: Abnormal   Collection Time: 10/12/20  9:31 PM  Result Value Ref Range   Glucose-Capillary 187 (H) 70 - 99 mg/dL    Comment: Glucose reference range applies only to samples taken after fasting for at least 8 hours.  CBC     Status: Abnormal   Collection Time: 10/13/20  5:52 AM  Result Value Ref Range   WBC 7.0 4.0 - 10.5 K/uL   RBC 5.44 (H) 3.87 - 5.11 MIL/uL   Hemoglobin 15.4 (H) 12.0 - 15.0 g/dL   HCT 47.4 (H) 36.0 - 46.0 %   MCV 87.1 80.0 - 100.0 fL   MCH 28.3 26.0 - 34.0 pg   MCHC 32.5 30.0 - 36.0 g/dL   RDW 12.9 11.5 - 15.5 %   Platelets 180 150 - 400 K/uL   nRBC 0.0 0.0 - 0.2 %    Comment: Performed at [facility], [address] [place], [place] [id number]  Basic metabolic panel     Status: Abnormal   Collection Time: 10/13/20  5:52 AM  Result Value Ref Range   Sodium 139 135 - 145 mmol/L   Potassium 3.7 3.5 - 5.1  mmol/L   Chloride 103 98 - 111 mmol/L   CO2 27 22 - 32 mmol/L   Glucose, Bld 193 (H) 70 - 99 mg/dL    Comment: Glucose reference range applies only to samples taken after fasting for at least 8 hours.   BUN 9 6 - 20 mg/dL   Creatinine, Ser 0.59 0.44 - 1.00 mg/dL   Calcium 8.6 (L) 8.9 - 10.3 mg/dL   GFR, Estimated >60 >60 mL/min    Comment: (NOTE) Calculated using the CKD-EPI Creatinine Equation (2021)    Anion gap 9 5 - 15    Comment: Performed at [facility], [address] [place], [place] [address]  Magnesium     Status: None   Collection Time: 10/13/20  5:52 AM  Result Value Ref Range   Magnesium 2.0 1.7 - 2.4 mg/dL    Comment: Performed at [facility], [address] [place], [place] [address]  Glucose, capillary     Status: Abnormal   Collection Time: 10/13/20  7:20 AM  Result Value Ref Range   Glucose-Capillary 227 (H) 70 - 99 mg/dL    Comment: Glucose reference range applies only to samples taken after fasting for at least 8 hours.  Glucose, capillary     Status: Abnormal   Collection Time: 10/13/20 11:31 AM  Result Value Ref Range   Glucose-Capillary 222 (H) 70 - 99 mg/dL    Comment: Glucose reference range applies only to samples taken after fasting for at least 8 hours.    Medications:  Current Facility-Administered Medications  Medication Dose Route Frequency Provider Last Rate Last Admin    stroke: mapping our early stages of recovery book   Does not apply Once Zierle-Ghosh, [place] B, DO       acetaminophen (TYLENOL) tablet 650 mg  650 mg Oral Q4H PRN Zierle-Ghosh, Asia B, DO       Or   acetaminophen (TYLENOL) 160 MG/5ML solution 650 mg  650 mg Per Tube Q4H PRN Zierle-Ghosh, Asia B, DO       Or   acetaminophen (TYLENOL) suppository 650 mg  650 mg Rectal Q4H  PRN Zierle-Ghosh, Asia B, DO       albuterol (VENTOLIN HFA) 108 (90 Base) MCG/ACT inhaler 1-2 puff  1-2 puff Inhalation Q6H PRN Zierle-Ghosh, Asia B, DO       aspirin EC tablet 81 mg  81 mg Oral Daily Zierle-Ghosh, Asia B, DO   81 mg at 10/13/20 0951   atorvastatin (LIPITOR) tablet 20 mg  20 mg Oral Daily Zierle-Ghosh, Asia B, DO   20 mg at 10/13/20 0949   clopidogrel (PLAVIX) tablet 75 mg  75 mg Oral Daily Zierle-Ghosh, Asia B, DO   75 mg at 10/13/20 0950   gabapentin (NEURONTIN) capsule 300 mg  300 mg Oral TID Zierle-Ghosh, Asia B, DO   300 mg at 10/13/20 0950   heparin injection 5,000 Units  5,000 Units Subcutaneous Q8H Zierle-Ghosh, Asia B, DO   5,000 Units at 10/13/20 1219   insulin aspart (novoLOG) injection 0-15 Units  0-15 Units Subcutaneous TID WC Zierle-Ghosh, Asia B, DO   5 Units at 10/13/20 1218   insulin aspart (novoLOG) injection 0-5 Units  0-5 Units Subcutaneous QHS Zierle-Ghosh, Asia B, DO       insulin aspart (novoLOG) injection 6 Units  6 Units Subcutaneous TID WC Johnson, Clanford L, MD   6 Units at 10/13/20 1219   insulin detemir (LEVEMIR) injection 25 Units  25 Units Subcutaneous Daily Zierle-Ghosh, [place]  B, DO   25 Units at 10/13/20 0951   labetalol (NORMODYNE) injection 10 mg  10 mg Intravenous Q2H PRN Zierle-Ghosh, Asia B, DO       mometasone-formoterol (DULERA) 200-5 MCG/ACT inhaler 2 puff  2 puff Inhalation BID Zierle-Ghosh, Asia B, DO   2 puff at 10/13/20 0718   senna-docusate (Senokot-S) tablet 1 tablet  1 tablet Oral QHS PRN Zierle-Ghosh, Asia B, DO       traZODone (DESYREL) tablet 50-100 mg  50-100 mg Oral QHS Zierle-Ghosh, Asia B, DO   100 mg at 10/12/20 2157    Musculoskeletal: Strength & Muscle Tone:  Unable to assess via tele health [place]:  Did not see patient ambulate Patient leans: N/A  Psychiatric Specialty Exam:  Presentation  General Appearance: Appropriate for Environment  Eye Contact:Good  Speech:Clear and Coherent; Normal Rate  Speech  Volume:Normal  Handedness:Right   Mood and Affect  Mood:Dysphoric  Affect:Appropriate; Congruent   Thought Process  Thought Processes:Coherent; Goal Directed  Descriptions of Associations:Intact  Orientation:Full (Time, Place and Person)  Thought Content:No data recorded History of Schizophrenia/Schizoaffective disorder:No data recorded Duration of Psychotic Symptoms:No data recorded Hallucinations:Hallucinations: None  Ideas of Reference:None  Suicidal Thoughts:Suicidal Thoughts: No  Homicidal Thoughts:Homicidal Thoughts: No   Sensorium  Memory:Immediate Good; Recent Good  Judgment:Intact  Insight:Present   Executive Functions  Concentration:Good  Attention Span:Good  [place] of Knowledge:Good  Language:Good   Psychomotor Activity  Psychomotor Activity:Psychomotor Activity: Normal   Assets  Assets:Communication Skills; Desire for Improvement; Financial Resources/Insurance; Housing; Leisure Time; Social Support; Transportation   Sleep  Sleep:Sleep: Good    Physical Exam: Physical Exam Vitals and nursing note reviewed.  Constitutional:      General: She is not in acute distress.    Appearance: Normal appearance. She is not ill-appearing.  Cardiovascular:     Rate and Rhythm: Normal rate.  Pulmonary:     Effort: Pulmonary effort is normal.  Neurological:     Mental Status: She is alert and oriented to person, place, and time.  Psychiatric:        Attention and Perception: Attention and perception normal. She does not perceive auditory or visual hallucinations.        Mood and Affect: Affect normal. Mood is depressed.        Speech: Speech normal.        Behavior: Behavior normal. Behavior is cooperative.        Thought Content: Thought content normal. Thought content is not paranoid or delusional. Thought content does not include homicidal or suicidal ideation.        Cognition and Memory: Cognition and memory normal.         Judgment: Judgment normal.   Review of Systems  Constitutional: Negative.   HENT: Negative.    Cardiovascular: Negative.   Gastrointestinal: Negative.   Psychiatric/Behavioral:  Negative for hallucinations, substance abuse and suicidal ideas. Depression: Stable.The patient does not have insomnia. Nervous/anxious: Stable.  Blood pressure (!) 158/41, pulse 61, temperature 98.5 F (36.9 C), resp. rate 17, height '5\' 5"'$  (1.651 m), weight 122.4 kg, SpO2 95 %. Body mass index is 44.91 kg/m.  Treatment Plan Summary: Plan Restarted Effexor XR 37.5 mg daily.  Patient to follow up with her PCP for medication management until she has established services with outpatient psychiatric provider.  Psychiatric consult complete and patient psychiatrically cleared  Disposition: No evidence of imminent risk to self or others at present.   Patient does not meet criteria  for psychiatric inpatient admission. Supportive therapy provided about ongoing stressors. Discussed crisis plan, support from social network, calling 911, coming to the Emergency Department, and calling Suicide Hotline.  This service was provided via telemedicine using a 2-way, interactive audio and video technology.  Names of all persons participating in this telemedicine service and their role in this encounter. Name: Earleen Newport Role: NP  Name: Dr. Hampton Abbot Role: Psychiatrist  Name: [employer] Role: Patient  Name:  Role:    Secure Message sent to attending Dr. Heath Lark and patient's nurse Lewis Shock, RN informing: Psychiatric consult complete.  Patient psychiatric cleared.  Restarted Effexor ER 37.5 mg daily patient to follow up with her PCP until psychiatric services has been established for medication management.  Patient may need prescription for the Effexor 37.5 mg.  Resources for outpatient psychiatric services have been added to AVS.     Rasheedah Reis, NP 10/13/2020 2:15 PM

## 2020-10-13 NOTE — BH Assessment (Addendum)
TO BE SEEN BY PSYCHIATRY for med recommendations. The following was noted by psychiatry,"Patient did not respond to Effexor, need new treatment recommendation for severe depression."   Night time provider informed of consult needs and request for am provider to follow up. Patient to evaluated by am provider to assist with medication management needs.   Discussed plan of care with patient's nurse Tammy, RN.

## 2020-10-13 NOTE — Discharge Summary (Signed)
Physician Discharge Summary  Joanna [name] [id number] DOB: 03-28-1970 DOA: 10/12/2020  PCP: Alanson Puls [facility] date: 10/12/2020  Discharge date: 10/13/2020  Admitted From:Home  Disposition:  Home  Recommendations for Outpatient Follow-up:  Follow up with PCP in 1-2 weeks Follow-up with neurology in 2 months Continue on dual antiplatelet therapy with aspirin and Plavix for the next 3 months and then continue on aspirin thereafter.  Continue statin. Refills given on multiple medications to include Effexor per psychiatry recommendations  Home Health: None  Equipment/Devices: None  Discharge Condition:Stable  CODE STATUS: Full  Diet recommendation: Heart Healthy/carb modified  Brief/Interim Summary:   Joanna [name]  is a 50 y.o. female, with history of anxiety, coronary artery disease, diabetes mellitus type 2, hyperlipidemia, hypertension, interstitial lung disease, morbid obesity, noncompliance with medical regimen, history of stroke, history of tobacco use disorder, and more presents the ED with a chief complaint of headache and left-sided weakness.  Patient was noted to have an acute/subacute CVA to the left cerebellar hemisphere that was contributing to her symptoms which have now fully resolved.  She has been assessed by PT/OT with no home recommendations or need for SNF noted.  She has been so noncompliant with her medications and insulin use at home which has likely contributed to her recurrent CVA.  This was related to major depressive disorder and the lack of fracture or that she had over the last 2 months.  Psychiatry has assessed her during this inpatient stay with recommendation for continuing her previous dose of Effexor.  This has been sent into her pharmacy along with aspirin and Plavix and refills on her insulin.  She states that she will work on her medication compliance and will follow-up with her PCP in the next 1-[redacted] weeks along with neurology as recommended in 2  months.  Neurology recommends continuing dual antiplatelet therapy as recommended along with her usual home statin.  While here, she was incidentally noted to be COVID-positive, but was not symptomatic.  She was given bertelivomab given her risk factors.  Discharge Diagnoses:  Principal Problem:   Stroke [facility]) Active Problems:   Interstitial lung disease ([place])   Hypertension   Morbid obesity with BMI of 50.0-59.9, adult (HCC)   Non compliance w medication regimen   Tobacco use disorder   Family history of coronary artery disease in father   CAD (coronary artery disease)   Agoraphobia   Generalized anxiety disorder   OSA (obstructive sleep apnea)   GERD (gastroesophageal reflux disease)   Cigarette nicotine dependence without complication   Type 2 diabetes mellitus with hyperlipidemia ([place])   COVID-19 virus infection   Medication management  Priniciple discharge diagnosis: Acute left cerebellar hemisphere CVA secondary to medication noncompliance.  Asymptomatic COVID infection.  Discharge Instructions  Discharge Instructions     Diet - low sodium heart healthy   Complete by: As directed    Increase activity slowly   Complete by: As directed       Allergies as of 10/13/2020       Reactions   Wellbutrin [bupropion] Rash        Medication List     STOP taking these medications    apixaban 5 MG Tabs tablet Commonly known as: ELIQUIS   levonorgestrel 20 MCG/DAY Iud Commonly known as: MIRENA       TAKE these medications    Advair HFA 115-21 MCG/ACT inhaler Generic drug: fluticasone-salmeterol Inhale 2 puffs into the lungs 2 (two) times daily.  albuterol 108 (90 Base) MCG/ACT inhaler Commonly known as: VENTOLIN HFA Inhale 1-2 puffs into the lungs every 6 (six) hours as needed for wheezing or shortness of breath.   aspirin 81 MG EC tablet Take 1 tablet (81 mg total) by mouth daily. Swallow whole. Start taking on: October 14, 2020   atorvastatin 20 MG  tablet Commonly known as: LIPITOR Take 20 mg by mouth daily. What changed: Another medication with the same name was removed. Continue taking this medication, and follow the directions you see here.   clopidogrel 75 MG tablet Commonly known as: PLAVIX Take 1 tablet (75 mg total) by mouth daily. Start taking on: October 14, 2020   gabapentin 300 MG capsule Commonly known as: NEURONTIN Take 300 mg by mouth 3 (three) times daily.   insulin aspart 100 UNIT/ML FlexPen Commonly known as: NOVOLOG Inject 4 Units into the skin 3 (three) times daily with meals.   insulin detemir 100 UNIT/ML FlexPen Commonly known as: LEVEMIR Inject 25 Units into the skin daily.   lisinopril-hydrochlorothiazide 20-12.5 MG tablet Commonly known as: ZESTORETIC Take 1 tablet by mouth daily.   Pen Needles 32G X 5 MM Misc Enough pen needles for levermir 25 units daily & novolog 4 units TID w/ meals x 30 days. No refills   traZODone 50 MG tablet Commonly known as: DESYREL Take 50-100 mg by mouth at bedtime.   venlafaxine XR 37.5 MG 24 hr capsule Commonly known as: EFFEXOR-XR Take 1 capsule (37.5 mg total) by mouth daily with breakfast.        Follow-up Information     [facility]. Call.   Specialty: Behavioral Health Why: Call to set up appointment for medication management intake Contact information: [address] [place] [phone]        Services, [facility] Follow up.   Why: Call to make an appointment for intake for medication management or can walk in Monday - Thursday from 8am to 3 pm Contact information: [place] [address] [phone]         [facility], [facility] Schedule an appointment as soon as possible for a visit in 1 week(s).   Contact information: [place] [address] [phone]         Herminio Commons, MD .   Specialty:  Cardiology Contact information: [place] [address] [phone]         Phillips Odor, MD. Schedule an appointment as soon as possible for a visit in 2 month(s).   Specialty: Neurology Contact information: [place] [address] [phone]                Allergies  Allergen Reactions   Wellbutrin [Bupropion] Rash    Consultations: Psychiatry Discussed case with neurology Dr. Merlene Laughter   Procedures/Studies: MR Brain W and Wo Contrast  Result Date: 10/12/2020 CLINICAL DATA:  CT/CTA head obtained earlier the same day EXAM: MRI HEAD WITHOUT AND WITH CONTRAST TECHNIQUE: Multiplanar, multiecho pulse sequences of the brain and surrounding structures were obtained without and with intravenous contrast. CONTRAST:  36m GADAVIST GADOBUTROL 1 MMOL/ML IV SOLN COMPARISON:  CT head 1 day prior FINDINGS: Brain: There is a small acute to subacute infarct in the left cerebellar hemisphere. There are small remote infarcts in the right frontal, parietal, and occipital lobes as seen on the prior MRI from 2021. Small foci of T2* hypointensity associated with the  frontal and parietal lobe infarcts are unchanged. Additional foci of FLAIR signal abnormality in the bilateral subcortical and periventricular white matter are nonspecific but may reflect sequela of chronic white matter microangiopathy. There is no mass lesion. There is no abnormal enhancement. There is no midline shift. The ventricles are not enlarged. Incidental note is made of a cavum septum pellucidum. Vascular: Normal flow voids. Skull and upper cervical spine: Normal marrow signal. Sinuses/Orbits: The paranasal sinuses are clear. The globes and orbits are unremarkable. Other: There is a small retention cyst in the right nasopharynx. Prominent adenoid tonsils are unchanged. IMPRESSION: 1. Small acute to subacute infarct in the left cerebellar hemisphere. 2. Mild chronic white matter microangiopathy and small remote  infarcts in the right cerebral hemisphere as above. Electronically Signed   By: Valetta Mole M.D.   On: 10/12/2020 09:32   ECHOCARDIOGRAM COMPLETE  Result Date: 10/12/2020    ECHOCARDIOGRAM REPORT   Patient Name:   Joanna [name] Date of Exam: 10/12/2020 Medical Rec #:  [id number]    Height:       65.0 in Accession #:    [id number]   Weight:       269.9 lb Date of Birth:  11-Jul-1970     BSA:          2.247 m Patient Age:    50 years     BP:           158/62 mmHg Patient Gender: F            HR:           56 bpm. Exam Location:  [facility] Procedure: 2D Echo, Cardiac Doppler, Color Doppler and Intracardiac            Opacification Agent Indications:    CVA  History:        Patient has prior history of Echocardiogram examinations, most                 recent 01/21/2020. Previous Myocardial Infarction and CAD,                 Stroke and COPD, Signs/Symptoms:Weakness; Risk Factors:Current                 Smoker, Morbid obesity, Hypertension, Dyslipidemia and Diabetes.                 Noncomplicance w/medication, Covid+.  Sonographer:    Dustin Flock RDCS Referring Phys: [id number] ASIA B [place]  Sonographer Comments: Technically difficult study due to poor echo windows and patient is morbidly obese. IMPRESSIONS  1. Left ventricular ejection fraction, by estimation, is 60 to 65%. The left ventricle has normal function. The left ventricle has no regional wall motion abnormalities. Left ventricular diastolic parameters are indeterminate.  2. Right ventricular systolic function is normal. The right ventricular size is normal.  3. The mitral valve is normal in structure. Trivial mitral valve regurgitation.  4. The aortic valve is normal in structure. Aortic valve regurgitation is not visualized.  5. The inferior vena cava is dilated in size with >50% respiratory variability, suggesting right atrial pressure of 8 mmHg. Comparison(s): The left ventricular function is unchanged. FINDINGS  Left Ventricle: Left ventricular  ejection fraction, by estimation, is 60 to 65%. The left ventricle has normal function. The left ventricle has no regional wall motion abnormalities. Definity contrast agent was given IV to delineate the left ventricular  endocardial borders. The left ventricular internal cavity size was normal in size.  There is no left ventricular hypertrophy. Left ventricular diastolic parameters are indeterminate. Right Ventricle: The right ventricular size is normal. Right vetricular wall thickness was not assessed. Right ventricular systolic function is normal. Left Atrium: Left atrial size was normal in size. Right Atrium: Right atrial size was normal in size. Pericardium: There is no evidence of pericardial effusion. Mitral Valve: The mitral valve is normal in structure. Trivial mitral valve regurgitation. Tricuspid Valve: The tricuspid valve is normal in structure. Tricuspid valve regurgitation is trivial. Aortic Valve: The aortic valve is normal in structure. Aortic valve regurgitation is not visualized. Pulmonic Valve: The pulmonic valve was not well visualized. Pulmonic valve regurgitation is not visualized. No evidence of pulmonic stenosis. Aorta: The aortic root and ascending aorta are structurally normal, with no evidence of dilitation. Venous: The inferior vena cava is dilated in size with greater than 50% respiratory variability, suggesting right atrial pressure of 8 mmHg. IAS/Shunts: No atrial level shunt detected by color flow Doppler.  LEFT VENTRICLE PLAX 2D LVIDd:         4.67 cm  Diastology LVIDs:         3.23 cm  LV e' medial:    5.87 cm/s LV PW:         1.13 cm  LV E/e' medial:  14.3 LV IVS:        1.19 cm  LV e' lateral:   7.36 cm/s LVOT diam:     2.20 cm  LV E/e' lateral: 11.4 LV SV:         87 LV SV Index:   39 LVOT Area:     3.80 cm  RIGHT VENTRICLE RV Basal diam:  2.38 cm RV S prime:     7.97 cm/s TAPSE (M-mode): 2.5 cm LEFT ATRIUM             Index       RIGHT ATRIUM           Index LA diam:        3.70  cm 1.65 cm/m  RA Area:     11.80 cm LA Vol (A2C):   46.8 ml 20.83 ml/m RA Volume:   22.60 ml  10.06 ml/m LA Vol (A4C):   60.1 ml 26.75 ml/m LA Biplane Vol: 55.7 ml 24.79 ml/m  AORTIC VALVE LVOT Vmax:   125.00 cm/s LVOT Vmean:  80.200 cm/s LVOT VTI:    0.230 m  AORTA Ao Root diam: 2.90 cm MITRAL VALVE MV Area (PHT): 3.40 cm    SHUNTS MV Decel Time: 223 msec    Systemic VTI:  0.23 m MV E velocity: 84.00 cm/s  Systemic Diam: 2.20 cm MV A velocity: 85.90 cm/s MV E/A ratio:  0.98 Dorris Carnes MD Electronically signed by Dorris Carnes MD Signature Date/Time: 10/12/2020/5:15:05 PM    Final    CT HEAD CODE STROKE WO CONTRAST  Result Date: 10/12/2020 CLINICAL DATA:  Code stroke.  Initial evaluation for acute stroke. EXAM: CT ANGIOGRAPHY HEAD AND NECK TECHNIQUE: Multidetector CT imaging of the head and neck was performed using the standard protocol during bolus administration of intravenous contrast. Multiplanar CT image reconstructions and MIPs were obtained to evaluate the vascular anatomy. Carotid stenosis measurements (when applicable) are obtained utilizing NASCET criteria, using the distal internal carotid diameter as the denominator. CONTRAST:  44m OMNIPAQUE IOHEXOL 350 MG/ML SOLN COMPARISON:  Prior CTA from 01/20/2020. FINDINGS: CT HEAD FINDINGS Brain: Cerebral volume within normal limits. No acute intracranial hemorrhage. No definite acute large vessel territory  infarct. No mass lesion, midline shift or mass effect. No hydrocephalus or extra-axial fluid collection. Vascular: No hyperdense vessel. Skull: Scalp soft tissues and calvarium within normal limits. Sinuses/Orbits: Globes and orbital soft tissues within normal limits. Mild scattered mucosal thickening noted throughout the paranasal sinuses. No mastoid effusion. Other: None. ASPECTS ([place] Stroke Program Early CT Score) - Ganglionic level infarction (caudate, lentiform nuclei, internal capsule, insula, M1-M3 cortex): 7 - Supraganglionic infarction (M4-M6  cortex): 3 Total score (0-10 with 10 being normal): 10 CTA NECK FINDINGS Aortic arch: Visualized aortic arch normal in caliber with normal 3 vessel morphology. Mild atheromatous change about the arch itself. No hemodynamically significant stenosis seen about the origin of the great vessels. Right carotid system: Right CCA widely patent proximally. There is a probable acutely ruptured plaque or possibly dissection involving the distal right CCA just prior to the carotid bifurcation. Acute appearing thrombus seen projecting into the arterial lumen at this level (series 6, image 196). Associated short-segment stenosis of up to 75% by NASCET criteria. This is at the site of previously identified intraluminal thrombus, but appears somewhat worsened on current exam, raising the possibility for a superimposed acute component. Additional scattered atheromatous change noted just distally about the carotid bifurcation/proximal right ICA without high-grade stenosis. Right ICA otherwise patent distally to the skull base. Left carotid system: Left CCA patent from its origin to the bifurcation without stenosis. Scattered plaque about the left carotid bulb/proximal left ICA without significant stenosis. Left ICA patent distally without stenosis, dissection or occlusion. Vertebral arteries: Both vertebral arteries arise from subclavian arteries. No proximal subclavian artery stenosis. Both vertebral arteries patent within the neck without stenosis, dissection or occlusion. Skeleton: No visible acute osseous finding. No worrisome osseous lesions. Patient is edentulous. Other neck: No other acute soft tissue abnormality within the neck. No mass or adenopathy. Upper chest: Scattered hazy ground-glass opacity with interlobular septal thickening noted within the visualized lungs, nonspecific, but favored to reflect mild pulmonary interstitial congestion/edema. Multiple scattered superimposed subcentimeter calcified granulomas noted  throughout the visualized lungs. Review of the MIP images confirms the above findings CTA HEAD FINDINGS Anterior circulation: Petrous segments patent bilaterally. Mild atheromatous change within the carotid siphons without significant stenosis. A1 segments patent bilaterally. Normal anterior communicating artery complex. Anterior cerebral arteries patent to their distal aspects without stenosis. No M1 stenosis or occlusion. Normal MCA bifurcations. No visible proximal MCA branch occlusion. Distal MCA branches perfused and symmetric. Posterior circulation: Both V4 segments patent to the vertebrobasilar junction without stenosis. Both PICA origins patent and normal. Basilar widely patent to its distal aspect without stenosis. Superior cerebellar arteries patent bilaterally. Both PCAs primarily supplied via the basilar and are well perfused to there distal aspects. Venous sinuses: There is an eccentric somewhat linear filling defect at the level of the distal transverse sinus, stable from prior (series 8, image 115). While this could reflect a prominent arachnoid granulation, possible chronic thrombus and/or fibrinous change related to prior thrombus could also have this appearance. Otherwise, major dural sinuses are grossly patent allowing for timing the contrast bolus. Anatomic variants: None significant.  No aneurysm. Review of the MIP images confirms the above findings IMPRESSION: CT HEAD IMPRESSION: 1. No acute intracranial abnormality. 2. Aspects = 10. CTA HEAD AND NECK IMPRESSION: 1. Persistent irregular filling defect involving the distal right CCA as above, consistent with intraluminal thrombus, possibly related to a ruptured plaque and/or short-segment dissection. Widely similar finding with seen at this location on prior CTA from 01/20/2020, overall appearance  and morphology on today's exam appears somewhat acute in nature and slightly worsened from prior, raising the possibility for a superimposed acute  component. Associated stenosis of up to 75% at this level. 2. No visible downstream embolic occlusion or other large vessel occlusion. 3. Additional scattered atheromatous change about the carotid bifurcations and carotid siphons without hemodynamically significant stenosis. 4. Eccentric filling defect involving the distal straight sinus as above, indeterminate. While this could reflect a prominent arachnoid granulation, possible chronic thrombus and/or fibrinous change could also have this appearance. This was also seen on prior CTA from 2021. 5. Scattered hazy ground-glass opacity within the visualized lungs, favored to reflect a degree of mild pulmonary interstitial congestion/edema. Critical Value/emergent results were called by telephone at the time of interpretation on 10/12/2020 at 1:22 am to provider [facility] , who verbally acknowledged these results. Electronically Signed   By: Jeannine Boga M.D.   On: 10/12/2020 02:02   CT ANGIO HEAD NECK W WO CM (CODE STROKE)  Result Date: 10/12/2020 CLINICAL DATA:  Code stroke.  Initial evaluation for acute stroke. EXAM: CT ANGIOGRAPHY HEAD AND NECK TECHNIQUE: Multidetector CT imaging of the head and neck was performed using the standard protocol during bolus administration of intravenous contrast. Multiplanar CT image reconstructions and MIPs were obtained to evaluate the vascular anatomy. Carotid stenosis measurements (when applicable) are obtained utilizing NASCET criteria, using the distal internal carotid diameter as the denominator. CONTRAST:  87m OMNIPAQUE IOHEXOL 350 MG/ML SOLN COMPARISON:  Prior CTA from 01/20/2020. FINDINGS: CT HEAD FINDINGS Brain: Cerebral volume within normal limits. No acute intracranial hemorrhage. No definite acute large vessel territory infarct. No mass lesion, midline shift or mass effect. No hydrocephalus or extra-axial fluid collection. Vascular: No hyperdense vessel. Skull: Scalp soft tissues and calvarium within normal  limits. Sinuses/Orbits: Globes and orbital soft tissues within normal limits. Mild scattered mucosal thickening noted throughout the paranasal sinuses. No mastoid effusion. Other: None. ASPECTS ([place] Early [place] Score) - Ganglionic level infarction (caudate, lentiform nuclei, internal capsule, insula, M1-M3 cortex): 7 - Supraganglionic infarction (M4-M6 cortex): 3 Total score (0-10 with 10 being normal): 10 CTA NECK FINDINGS Aortic arch: Visualized aortic arch normal in caliber with normal 3 vessel morphology. Mild atheromatous change about the arch itself. No hemodynamically significant stenosis seen about the origin of the great vessels. Right carotid system: Right CCA widely patent proximally. There is a probable acutely ruptured plaque or possibly dissection involving the distal right CCA just prior to the carotid bifurcation. Acute appearing thrombus seen projecting into the arterial lumen at this level (series 6, image 196). Associated short-segment stenosis of up to 75% by NASCET criteria. This is at the site of previously identified intraluminal thrombus, but appears somewhat worsened on current exam, raising the possibility for a superimposed acute component. Additional scattered atheromatous change noted just distally about the carotid bifurcation/proximal right ICA without high-grade stenosis. Right ICA otherwise patent distally to the skull base. Left carotid system: Left CCA patent from its origin to the bifurcation without stenosis. Scattered plaque about the left carotid bulb/proximal left ICA without significant stenosis. Left ICA patent distally without stenosis, dissection or occlusion. Vertebral arteries: Both vertebral arteries arise from subclavian arteries. No proximal subclavian artery stenosis. Both vertebral arteries patent within the neck without stenosis, dissection or occlusion. Skeleton: No visible acute osseous finding. No worrisome osseous lesions. Patient is edentulous.  Other neck: No other acute soft tissue abnormality within the neck. No mass or adenopathy. Upper chest: Scattered hazy ground-glass  opacity with interlobular septal thickening noted within the visualized lungs, nonspecific, but favored to reflect mild pulmonary interstitial congestion/edema. Multiple scattered superimposed subcentimeter calcified granulomas noted throughout the visualized lungs. Review of the MIP images confirms the above findings CTA HEAD FINDINGS Anterior circulation: Petrous segments patent bilaterally. Mild atheromatous change within the carotid siphons without significant stenosis. A1 segments patent bilaterally. Normal anterior communicating artery complex. Anterior cerebral arteries patent to their distal aspects without stenosis. No M1 stenosis or occlusion. Normal MCA bifurcations. No visible proximal MCA branch occlusion. Distal MCA branches perfused and symmetric. Posterior circulation: Both V4 segments patent to the vertebrobasilar junction without stenosis. Both PICA origins patent and normal. Basilar widely patent to its distal aspect without stenosis. Superior cerebellar arteries patent bilaterally. Both PCAs primarily supplied via the basilar and are well perfused to there distal aspects. Venous sinuses: There is an eccentric somewhat linear filling defect at the level of the distal transverse sinus, stable from prior (series 8, image 115). While this could reflect a prominent arachnoid granulation, possible chronic thrombus and/or fibrinous change related to prior thrombus could also have this appearance. Otherwise, major dural sinuses are grossly patent allowing for timing the contrast bolus. Anatomic variants: None significant.  No aneurysm. Review of the MIP images confirms the above findings IMPRESSION: CT HEAD IMPRESSION: 1. No acute intracranial abnormality. 2. Aspects = 10. CTA HEAD AND NECK IMPRESSION: 1. Persistent irregular filling defect involving the distal right CCA as  above, consistent with intraluminal thrombus, possibly related to a ruptured plaque and/or short-segment dissection. Widely similar finding with seen at this location on prior CTA from 01/20/2020, overall appearance and morphology on today's exam appears somewhat acute in nature and slightly worsened from prior, raising the possibility for a superimposed acute component. Associated stenosis of up to 75% at this level. 2. No visible downstream embolic occlusion or other large vessel occlusion. 3. Additional scattered atheromatous change about the carotid bifurcations and carotid siphons without hemodynamically significant stenosis. 4. Eccentric filling defect involving the distal straight sinus as above, indeterminate. While this could reflect a prominent arachnoid granulation, possible chronic thrombus and/or fibrinous change could also have this appearance. This was also seen on prior CTA from 2021. 5. Scattered hazy ground-glass opacity within the visualized lungs, favored to reflect a degree of mild pulmonary interstitial congestion/edema. Critical Value/emergent results were called by telephone at the time of interpretation on 10/12/2020 at 1:22 am to provider [facility] , who verbally acknowledged these results. Electronically Signed   By: Jeannine Boga M.D.   On: 10/12/2020 02:02     Discharge Exam: Vitals:   10/13/20 0718 10/13/20 1418  BP:  (!) 144/77  Pulse:  71  Resp:  16  Temp:  99.4 F (37.4 C)  SpO2: 95% 94%   Vitals:   10/13/20 0003 10/13/20 0602 10/13/20 0718 10/13/20 1418  BP: (!) 163/74 (!) 158/41  (!) 144/77  Pulse: 66 61  71  Resp: '17 17  16  '$ Temp: 98.6 F (37 C) 98.5 F (36.9 C)  99.4 F (37.4 C)  TempSrc:    Oral  SpO2: 94% 95% 95% 94%  Weight:      Height:        General: Pt is alert, awake, not in acute distress, obese Cardiovascular: RRR, S1/S2 +, no rubs, no gallops Respiratory: CTA bilaterally, no wheezing, no rhonchi Abdominal: Soft, NT, ND, bowel  sounds + Extremities: no edema, no cyanosis    The results of significant diagnostics from this  hospitalization (including imaging, microbiology, ancillary and laboratory) are listed below for reference.     Microbiology: Recent Results (from the past 240 hour(s))  Resp Panel by RT-PCR (Flu A&B, Covid) Nasopharyngeal Swab     Status: Abnormal   Collection Time: 10/12/20  1:34 AM   Specimen: Nasopharyngeal Swab; Nasopharyngeal(NP) swabs in vial transport medium  Result Value Ref Range Status   SARS Coronavirus 2 by RT PCR POSITIVE (A) NEGATIVE Final    Comment: RESULT CALLED TO, READ BACK BY AND VERIFIED WITH: Gessell,C'@0247'$  by matthews, b 9.6.22 (NOTE) SARS-CoV-2 target nucleic acids are DETECTED.  The SARS-CoV-2 RNA is generally detectable in upper respiratory specimens during the acute phase of infection. Positive results are indicative of the presence of the identified virus, but do not rule out bacterial infection or co-infection with other pathogens not detected by the test. Clinical correlation with patient history and other diagnostic information is necessary to determine patient infection status. The expected result is Negative.  Fact Sheet for Patients: EntrepreneurPulse.com.au  Fact Sheet for Healthcare Providers: IncredibleEmployment.be  This test is not yet approved or cleared by the [place] FDA and  has been authorized for detection and/or diagnosis of SARS-CoV-2 by FDA under an Emergency Use Authorization (EUA).  This EUA will remain in effect (meaning this test can  be used) for the duration of  the COVID-19 declaration under Section 564(b)(1) of the Act, 21 U.S.C. section 360bbb-3(b)(1), unless the authorization is terminated or revoked sooner.     Influenza A by PCR NEGATIVE NEGATIVE Final   Influenza B by PCR NEGATIVE NEGATIVE Final    Comment: (NOTE) The Xpert Xpress SARS-CoV-2/FLU/RSV plus assay is intended as  an aid in the diagnosis of influenza from Nasopharyngeal swab specimens and should not be used as a sole basis for treatment. Nasal washings and aspirates are unacceptable for Xpert Xpress SARS-CoV-2/FLU/RSV testing.  Fact Sheet for Patients: EntrepreneurPulse.com.au  Fact Sheet for Healthcare Providers: IncredibleEmployment.be  This test is not yet approved or cleared by the [place] FDA and has been authorized for detection and/or diagnosis of SARS-CoV-2 by FDA under an Emergency Use Authorization (EUA). This EUA will remain in effect (meaning this test can be used) for the duration of the COVID-19 declaration under Section 564(b)(1) of the Act, 21 U.S.C. section 360bbb-3(b)(1), unless the authorization is terminated or revoked.  Performed at [facility], [address] [place], [place] [address]      Labs: BNP (last 3 results) No results for input(s): BNP in the last 8760 hours. Basic Metabolic Panel: Recent Labs  Lab 10/12/20 0128 10/12/20 0140 10/12/20 0605 10/13/20 0552  NA 132* 136  --  139  K 3.4* 3.7  --  3.7  CL 100 97*  --  103  CO2 28  --   --  27  GLUCOSE 262* 265*  --  193*  BUN 9 9  --  9  CREATININE 0.69 0.60 0.61 0.59  CALCIUM 8.4*  --   --  8.6*  MG  --   --   --  2.0   Liver Function Tests: Recent Labs  Lab 10/12/20 0128  AST 23  ALT 27  ALKPHOS 95  BILITOT 0.7  PROT 7.1  ALBUMIN 3.4*   No results for input(s): LIPASE, AMYLASE in the last 168 hours. No results for input(s): AMMONIA in the last 168 hours. CBC: Recent Labs  Lab 10/12/20 0128 10/12/20 0140 10/13/20 0552  WBC 9.3  --  7.0  NEUTROABS  5.6  --   --   HGB 15.9* 16.0* 15.4*  HCT 47.4* 47.0* 47.4*  MCV 86.2  --  87.1  PLT 190  --  180   Cardiac Enzymes: No results for input(s): CKTOTAL, CKMB, CKMBINDEX, TROPONINI in the last 168 hours. BNP: Invalid input(s): POCBNP CBG: Recent Labs  Lab 10/12/20 1214 10/12/20 1651  10/12/20 2131 10/13/20 0720 10/13/20 1131  GLUCAP 234* 135* 187* 227* 222*   D-Dimer No results for input(s): DDIMER in the last 72 hours. Hgb A1c Recent Labs    10/12/20 0605  HGBA1C 12.7*   Lipid Profile Recent Labs    10/12/20 0605  CHOL 137  HDL 21*  LDLCALC 54  TRIG 308*  CHOLHDL 6.5   Thyroid function studies No results for input(s): TSH, T4TOTAL, T3FREE, THYROIDAB in the last 72 hours.  Invalid input(s): FREET3 Anemia work up No results for input(s): VITAMINB12, FOLATE, FERRITIN, TIBC, IRON, RETICCTPCT in the last 72 hours. Urinalysis No results found for: COLORURINE, APPEARANCEUR, [place], [place], GLUCOSEU, [place], [place], [place], PROTEINUR, UROBILINOGEN, NITRITE, LEUKOCYTESUR Sepsis Labs Invalid input(s): PROCALCITONIN,  WBC,  LACTICIDVEN Microbiology Recent Results (from the past 240 hour(s))  Resp Panel by RT-PCR (Flu A&B, Covid) Nasopharyngeal Swab     Status: Abnormal   Collection Time: 10/12/20  1:34 AM   Specimen: Nasopharyngeal Swab; Nasopharyngeal(NP) swabs in vial transport medium  Result Value Ref Range Status   SARS Coronavirus 2 by RT PCR POSITIVE (A) NEGATIVE Final    Comment: RESULT CALLED TO, READ BACK BY AND VERIFIED WITH: Gessell,C'@0247'$  by matthews, b 9.6.22 (NOTE) SARS-CoV-2 target nucleic acids are DETECTED.  The SARS-CoV-2 RNA is generally detectable in upper respiratory specimens during the acute phase of infection. Positive results are indicative of the presence of the identified virus, but do not rule out bacterial infection or co-infection with other pathogens not detected by the test. Clinical correlation with patient history and other diagnostic information is necessary to determine patient infection status. The expected result is Negative.  Fact Sheet for Patients: EntrepreneurPulse.com.au  Fact Sheet for Healthcare Providers: IncredibleEmployment.be  This test is not yet  approved or cleared by the [place] FDA and  has been authorized for detection and/or diagnosis of SARS-CoV-2 by FDA under an Emergency Use Authorization (EUA).  This EUA will remain in effect (meaning this test can  be used) for the duration of  the COVID-19 declaration under Section 564(b)(1) of the Act, 21 U.S.C. section 360bbb-3(b)(1), unless the authorization is terminated or revoked sooner.     Influenza A by PCR NEGATIVE NEGATIVE Final   Influenza B by PCR NEGATIVE NEGATIVE Final    Comment: (NOTE) The Xpert Xpress SARS-CoV-2/FLU/RSV plus assay is intended as an aid in the diagnosis of influenza from Nasopharyngeal swab specimens and should not be used as a sole basis for treatment. Nasal washings and aspirates are unacceptable for Xpert Xpress SARS-CoV-2/FLU/RSV testing.  Fact Sheet for Patients: EntrepreneurPulse.com.au  Fact Sheet for Healthcare Providers: IncredibleEmployment.be  This test is not yet approved or cleared by the [place] FDA and has been authorized for detection and/or diagnosis of SARS-CoV-2 by FDA under an Emergency Use Authorization (EUA). This EUA will remain in effect (meaning this test can be used) for the duration of the COVID-19 declaration under Section 564(b)(1) of the Act, 21 U.S.C. section 360bbb-3(b)(1), unless the authorization is terminated or revoked.  Performed at [facility], [address] [place], [place] [address]      Time coordinating discharge: 54  minutes  SIGNED:   Rodena Goldmann, DO [facility] 10/13/2020, 3:22 PM  If 7PM-7AM, please contact night-coverage [email or web address]

## 2020-11-08 ENCOUNTER — Other Ambulatory Visit: Payer: Self-pay

## 2020-11-08 ENCOUNTER — Ambulatory Visit: Payer: Medicaid Other | Admitting: Cardiology

## 2020-11-08 ENCOUNTER — Encounter: Payer: Self-pay | Admitting: Cardiology

## 2020-11-08 VITALS — BP 142/70 | HR 86 | Ht 65.0 in | Wt 275.6 lb

## 2020-11-08 DIAGNOSIS — I2510 Atherosclerotic heart disease of native coronary artery without angina pectoris: Secondary | ICD-10-CM

## 2020-11-08 DIAGNOSIS — E785 Hyperlipidemia, unspecified: Secondary | ICD-10-CM

## 2020-11-08 DIAGNOSIS — I10 Essential (primary) hypertension: Secondary | ICD-10-CM

## 2020-11-08 DIAGNOSIS — I2583 Coronary atherosclerosis due to lipid rich plaque: Secondary | ICD-10-CM

## 2020-11-08 DIAGNOSIS — I639 Cerebral infarction, unspecified: Secondary | ICD-10-CM

## 2020-11-08 DIAGNOSIS — G4733 Obstructive sleep apnea (adult) (pediatric): Secondary | ICD-10-CM

## 2020-11-08 MED ORDER — ICOSAPENT ETHYL 1 G PO CAPS: 2.0000 g | ORAL_CAPSULE | Freq: Two times a day (BID) | ORAL | 3 refills | Status: AC

## 2020-11-08 MED ORDER — ROSUVASTATIN CALCIUM 20 MG PO TABS: 20.0000 mg | ORAL_TABLET | Freq: Every day | ORAL | 3 refills | Status: AC

## 2020-11-08 NOTE — Patient Instructions (Signed)
Medication Instructions:  Your physician has recommended you make the following change in your medication:  1) STOP taking Lipitor (atorvastatin) 2) START taking Crestor (rosuvastatin) 20 mg daily  3) START taking Vascepa 2 g twice daily   *If you need a refill on your cardiac medications before your next appointment, please call your pharmacy*   Lab Work: Fasting lipids and ALT in 8 weeks  If you have labs (blood work) drawn today and your tests are completely normal, you will receive your results only by: [place] (if you have MyChart) OR A paper copy in the mail If you have any lab test that is abnormal or we need to change your treatment, we will call you to review the results.   Testing/Procedures: Your physician has recommended that you have a sleep study. This test records several body functions during sleep, including: brain activity, eye movement, oxygen and carbon dioxide blood levels, heart rate and rhythm, breathing rate and rhythm, the flow of air through your mouth and nose, snoring, body muscle movements, and chest and belly movement.   Follow-Up: At [facility], you and your health needs are our priority.  As part of our continuing mission to provide you with exceptional heart care, we have created designated Provider Care Teams.  These Care Teams include your primary Cardiologist (physician) and Advanced Practice Providers (APPs -  Physician Assistants and Nurse Practitioners) who all work together to provide you with the care you need, when you need it.  Follow up as needed based on results of testing.

## 2020-11-08 NOTE — Addendum Note (Signed)
Addended by: Antonieta Iba on: 11/08/2020 10:25 AM   Modules accepted: Orders

## 2020-11-08 NOTE — Progress Notes (Signed)
Cardiology Office Note:    Date:  11/08/2020   ID:  Joanna [name], DOB 1970/04/18, MRN [id number]  PCP:  Alanson Puls [facility]  Cardiologist:  Kate Sable, MD (Inactive)    Referring MD: Alanson Puls, [facility]   Chief Complaint  Patient presents with   Coronary Artery Disease   Hyperlipidemia     History of Present Illness:    Joanna [name] is a 50 y.o. female with a hx of with history of anxiety, coronary artery disease (90% pRCA 2018 with percutaneous thrombectomy with no stent in setting of Inferior STEMI and placed on DAPT), diabetes mellitus type 2, hyperlipidemia, hypertension, interstitial lung disease, morbid obesity, OSA, noncompliance with medical regimen, history of stroke, history of tobacco use disorder.  She was recently admitted to hospital with HA and left sided weakness and found to have acute CVA in the left cerebellar hemisphere.  She was also COVID 19 +.  She has noncompliance with meds especially her Insulin at home.  She was started on ASA and Plavix and apixaban was stopped. She had a TTE  showing normal LVF with G1DD. She has not been seen by Cardiology in some time.  She is here today for followup and is doing well.  She denies any chest pain or pressure, SOB, DOE, PND, orthopnea, palpitations (except with panic attacks) or syncope. She has intermittent LE edema if she stands for too long.  She has been told that she snores at night and was dx with OSA years ago but never used CPAP.  She feels tired when she gets up in the am and has to nap daily since the CVA.  She is compliant with her meds and is tolerating meds with no SE.     Past Medical History:  Diagnosis Date   Anxiety    Dx at age 89   Arthritis    knee   CAD (coronary artery disease)    a. Inferior STEMI 07/2014 - s/p thrombectomy of RCA, no stenting.   Depression    at age 39; bipolar recent   Diabetes mellitus without complication ([place])    Diabetes type 2, uncontrolled Dx 2010   DJD  (degenerative joint disease)    Gout    Hyperlipidemia Dx 2010   Hypertension    Dx at age 62   IBS (irritable bowel syndrome)    Interstitial lung disease ([place]) Dx 2011   No biopsy   Lung disorder Dx 2011   Morbid obesity ([facility])    Neuropathy    Non compliance w medication regimen    PCOS (polycystic ovarian syndrome) 1988   no children    Sleep apnea    STEMI (ST elevation myocardial infarction) ([place]) 07/23/2014   Stroke ([place])    Tobacco abuse     Past Surgical History:  Procedure Laterality Date   CARDIAC CATHETERIZATION N/A 07/23/2014   Procedure: Left Heart Cath and Coronary Angiography;  Surgeon: Peter M Martinique, MD;  Location: [place] [facility];  Service: Cardiovascular;  Laterality: N/A;   CARDIAC CATHETERIZATION N/A 07/27/2014   Procedure: Left Heart Cath and Coronary Angiography;  Surgeon: Jettie Booze, MD;  Location: [place] [facility];  Service: Cardiovascular;  Laterality: N/A;   CARDIAC CATHETERIZATION N/A 07/27/2014   Procedure: Coronary Balloon Angioplasty;  Surgeon: Jettie Booze, MD;  Location: [facility] [facility];  Service: Cardiovascular;  Laterality: N/A;  thrombectomy only   CORONARY ANGIOPLASTY     TEE WITHOUT CARDIOVERSION N/A  01/21/2020   Procedure: TRANSESOPHAGEAL ECHOCARDIOGRAM (TEE);  Surgeon: Kate Sable, MD;  Location: [facility];  Service: Cardiovascular;  Laterality: N/A;    Current Medications: Current Meds  Medication Sig   albuterol (VENTOLIN HFA) 108 (90 Base) MCG/ACT inhaler Inhale 1-2 puffs into the lungs every 6 (six) hours as needed for wheezing or shortness of breath.   aspirin EC 81 MG EC tablet Take 1 tablet (81 mg total) by mouth daily. Swallow whole.   atorvastatin (LIPITOR) 20 MG tablet Take 20 mg by mouth daily.   clopidogrel (PLAVIX) 75 MG tablet Take 1 tablet (75 mg total) by mouth daily.   fluticasone-salmeterol (ADVAIR HFA) 115-21 MCG/ACT inhaler Inhale 2 puffs into the lungs 2 (two) times daily.   gabapentin  (NEURONTIN) 600 MG tablet Take 600 mg by mouth 3 (three) times daily.   insulin aspart (NOVOLOG) 100 UNIT/ML FlexPen Inject 4 Units into the skin 3 (three) times daily with meals.   insulin detemir (LEVEMIR) 100 UNIT/ML FlexPen Inject 25 Units into the skin daily.   Insulin Pen Needle (PEN NEEDLES) 32G X 5 MM MISC Enough pen needles for levermir 25 units daily & novolog 4 units TID w/ meals x 30 days. No refills   lisinopril-hydrochlorothiazide (ZESTORETIC) 20-12.5 MG tablet Take 1 tablet by mouth daily.   traZODone (DESYREL) 100 MG tablet Take 100 mg by mouth at bedtime.   venlafaxine XR (EFFEXOR-XR) 37.5 MG 24 hr capsule Take 1 capsule (37.5 mg total) by mouth daily with breakfast.     Allergies:   Wellbutrin [bupropion]   Social History   Socioeconomic History   Marital status: Single    Spouse name: Not on file   Number of children: 0   Years of education: Not on file   Highest education level: Not on file  Occupational History   Occupation: unemployed  Tobacco Use   Smoking status: Every Day    Packs/day: 1.00    Years: 31.00    Pack years: 31.00    Types: Cigarettes    Start date: 08/04/1983   Smokeless tobacco: Never   Tobacco comments:    about 10-15 cigs daily 09/28/14 - peak 2ppd  Vaping Use   Vaping Use: Never used  Substance and Sexual Activity   Alcohol use: No    Alcohol/week: 0.0 standard drinks   Drug use: No   Sexual activity: Never    Birth control/protection: I.U.D.  Other Topics Concern   Not on file  Social History Narrative   Originally from [place], [place]. She has only lived in [place]. She moved to [facility] in 03-29-14 after her brother died. Currently lives with her sister. She has traveled to [place], [place], [place], [place], [place], [place]. Previously has worked as a Corporate investment banker, in [employer], & also in Data processing manager. She has not been able to keep a job because of her panic attacks. No international travel. Currently has a dog & cat in her residence. No bird, mold, or hot  tub exposure. No asbestos exposure.    Social Determinants of Health   Financial Resource Strain: Not on file  Food Insecurity: Not on file  Transportation Needs: Not on file  Physical Activity: Not on file  Stress: Not on file  Social Connections: Not on file     Family History: The patient's family history includes Asthma in her brother; Breast cancer in her paternal aunt; COPD in her brother and paternal grandmother; Cancer in her father, maternal grandmother, and  mother; Cirrhosis in her maternal aunt and maternal uncle; Coronary artery disease (age of onset: 44) in her father; Depression in her brother, mother, and sister; Diabetes in her father and sister; Drug abuse in her brother, father, and mother; Emphysema in her maternal grandfather; Heart disease in her father and sister; Hyperlipidemia in her father; Hypertension in her father, mother, and sister; Liver disease in her brother; Lung cancer in her father and mother; Stroke in her brother; Thyroid disease in her sister. There is no history of Rheumatologic disease, Colon cancer, or Colon polyps.  ROS:   Please see the history of present illness.    ROS  All other systems reviewed and negative.   EKGs/Labs/Other Studies Reviewed:    The following studies were reviewed today: Hospital notes  EKG:  EKG is not ordered today.   Recent Labs: 10/12/2020: ALT 27 10/13/2020: BUN 9; Creatinine, Ser 0.59; Hemoglobin 15.4; Magnesium 2.0; Platelets 180; Potassium 3.7; Sodium 139   Recent Lipid Panel    Component Value Date/Time   CHOL 137 10/12/2020 0605   TRIG 308 (H) 10/12/2020 0605   HDL 21 (L) 10/12/2020 0605   CHOLHDL 6.5 10/12/2020 0605   VLDL 62 (H) 10/12/2020 0605   LDLCALC 54 10/12/2020 0605        Physical Exam:    VS:  BP (!) 142/70   Pulse 86   Ht 5\' 5"  (1.651 m)   Wt 275 lb 9.6 oz (125 kg)   SpO2 96%   BMI 45.86 kg/m     Wt Readings from Last 3 Encounters:  11/08/20 275 lb 9.6 oz (125 kg)  10/12/20 269  lb 14.4 oz (122.4 kg)  08/19/20 276 lb (125.2 kg)     GEN:  Well nourished, well developed in no acute distress HEENT: Normal NECK: No JVD; No carotid bruits LYMPHATICS: No lymphadenopathy CARDIAC: RRR, no murmurs, rubs, gallops RESPIRATORY:  Clear to auscultation without rales, wheezing or rhonchi  ABDOMEN: Soft, non-tender, non-distended MUSCULOSKELETAL:  No edema; No deformity  SKIN: Warm and dry NEUROLOGIC:  Alert and oriented x 3 PSYCHIATRIC:  Normal affect   ASSESSMENT:    1. Coronary artery disease due to lipid rich plaque   2. Primary hypertension   3. Hyperlipidemia LDL goal <70   4. Cerebrovascular accident (CVA), unspecified mechanism ([place])    PLAN:    In order of problems listed above:   ASCAD -s/p remote inferior STEMI with 90% pRCA 2018 with percutaneous thrombectomy with no stent  -she has not had any anginal sx -Continue prescription drug management with ASA 81mg  daily, Plavix 75mg  daily and statin with PRN refills   2.  HTN -BP controlled on exam today -Continue prescription drug management with Lisinopril HCT 20-125mg  daily with PRN refills -I have personally reviewed and interpreted outside labs performed by patient's PCP which showed SCr 0.59, K+ 3.7    3.  HLD -LDL goal < 70 -I have personally reviewed and interpreted outside labs performed by patient's PCP which showed LDL 54, HDL 21, TAGS 308 -Continue prescription drug management with change atorvastatin to Crestor 20mg  daily due to elevated TAGS -add Vascepa 2gm BID -repeat FLP and ALT In 8 weeks   4.  Recent CVA -s/p acute left cerebellar hemisphere CVA -she has a hx of multiple CVAs in 01/2020 as well felt cardioembolic from distal right CCA with intraluminal thrombus felt related to ruptured plaque or short dissection and associated stenosis of 75% -now on ASA and Plavix -continue statin  and work on getting TAGs better -followed by Neuro  5.  OSA -she has a hx of OSA but never was on  CPAP years ago -she feels tired in the and naps during the day and has been told she snores -I will get a PSG to reassess   Time Spent: 25 minutes total time of encounter, including 15 minutes spent in face-to-face patient care on the date of this encounter. This time includes coordination of care and counseling regarding above mentioned problem list. Remainder of non-face-to-face time involved reviewing chart documents/testing relevant to the patient encounter and documentation in the medical record. I have independently reviewed documentation from referring provider  Medication Adjustments/Labs and Tests Ordered: Current medicines are reviewed at length with the patient today.  Concerns regarding medicines are outlined above.  No orders of the defined types were placed in this encounter.  No orders of the defined types were placed in this encounter.   Signed, Fransico Him, MD  11/08/2020 10:15 AM    [place]

## 2020-11-08 NOTE — Addendum Note (Signed)
Addended by: Antonieta Iba on: 11/08/2020 10:21 AM   Modules accepted: Orders

## 2020-11-18 ENCOUNTER — Telehealth: Payer: Self-pay | Admitting: *Deleted

## 2020-11-18 NOTE — Telephone Encounter (Signed)
-----   Message from Lauralee Evener, [place] sent at 11/08/2020  3:17 PM EDT -----  ----- Message ----- From: Antonieta Iba, RN Sent: 11/08/2020  11:09 AM EDT To: Cv Div Sleep Studies  PSG sleep study has been ordered.  Thanks!

## 2020-11-18 NOTE — Telephone Encounter (Signed)
PER [place] Prior Authorization for PSG sent to [facility] via Phone. AUTH# [id number] VALID 12-06-20---12-30--22.[place] Kitchen

## 2020-12-10 NOTE — Telephone Encounter (Signed)
Patient is scheduled for lab study on 12/31/20. Patient understands her sleep study will be done at [facility] sleep lab. Patient understands she will receive a sleep packet in a week or so. Patient understands to call if she does not receive the sleep packet in a timely manner. Patient agrees with treatment and thanked me for call.

## 2020-12-12 ENCOUNTER — Encounter: Payer: Medicaid Other | Admitting: Cardiology

## 2020-12-31 ENCOUNTER — Ambulatory Visit: Payer: Medicaid Other | Attending: Cardiology | Admitting: Cardiology

## 2020-12-31 ENCOUNTER — Other Ambulatory Visit: Payer: Self-pay

## 2020-12-31 DIAGNOSIS — R0683 Snoring: Secondary | ICD-10-CM | POA: Diagnosis not present

## 2020-12-31 DIAGNOSIS — G4733 Obstructive sleep apnea (adult) (pediatric): Secondary | ICD-10-CM

## 2020-12-31 DIAGNOSIS — Z6841 Body Mass Index (BMI) 40.0 and over, adult: Secondary | ICD-10-CM

## 2020-12-31 DIAGNOSIS — I493 Ventricular premature depolarization: Secondary | ICD-10-CM | POA: Insufficient documentation

## 2020-12-31 DIAGNOSIS — G4736 Sleep related hypoventilation in conditions classified elsewhere: Secondary | ICD-10-CM | POA: Insufficient documentation

## 2020-12-31 DIAGNOSIS — G4734 Idiopathic sleep related nonobstructive alveolar hypoventilation: Secondary | ICD-10-CM

## 2020-12-31 DIAGNOSIS — R0902 Hypoxemia: Secondary | ICD-10-CM | POA: Insufficient documentation

## 2021-01-02 NOTE — Procedures (Signed)
   Patient Name: Joanna [name], Joanna [name] Date:12/31/2020 Gender: Female D.O.B: [birth year] Age (years): 70 Referring Provider: Fransico Him MD, ABSM Height (inches): 65 Interpreting Physician: Fransico Him MD, ABSM Weight (lbs): 275 RPSGT: Peak, Robert BMI: 46 MRN: [id number] Neck Size: 18.00  CLINICAL INFORMATION Sleep Study Type: NPSG  Indication for sleep study: N/A  Epworth Sleepiness Score: N/A  SLEEP STUDY TECHNIQUE As per the AASM Manual for the Scoring of Sleep and Associated Events v2.3 (April 2016) with a hypopnea requiring 4% desaturations.  The channels recorded and monitored were frontal, central and occipital EEG, electrooculogram (EOG), submentalis EMG (chin), nasal and oral airflow, thoracic and abdominal wall motion, anterior tibialis EMG, snore microphone, electrocardiogram, and pulse oximetry.  MEDICATIONS Medications self-administered by patient taken the night of the study : N/A  SLEEP ARCHITECTURE The study was initiated at 9:18:42 PM and ended at 5:13:08 AM.  Sleep onset time was 10.6 minutes and the sleep efficiency was 96.0%. The total sleep time was 455.5 minutes.  Stage REM latency was 156.5 minutes.  The patient spent 2.52% of the night in stage N1 sleep, 87.60% in stage N2 sleep, 1.10% in stage N3 and 8.8% in REM.  Alpha intrusion was absent.  Supine sleep was 0.00%.  RESPIRATORY PARAMETERS The overall apnea/hypopnea index (AHI) was 2.8 per hour. There were 0 total apneas, including 0 obstructive, 0 central and 0 mixed apneas. There were 21 hypopneas and 0 RERAs.  The AHI during Stage REM sleep was 16.5 per hour.  AHI while supine was N/A per hour.  The mean oxygen saturation was 88.51%. The minimum SpO2 during sleep was 80.00%.  soft snoring was noted during this study.  CARDIAC DATA The 2 lead EKG demonstrated sinus rhythm. The mean heart rate was 70.92 beats per minute. Other EKG findings include: PVCs.  LEG MOVEMENT DATA The total  PLMS were 0 with a resulting PLMS index of 0.00. Associated arousal with leg movement index was 0.0 .  IMPRESSIONS - No significant obstructive sleep apnea occurred during this study (AHI = 2.8/h). - Moderate oxygen desaturation was noted during this study (Min O2 = 80.00%). - The patient snored with soft snoring volume. - EKG findings include PVCs. - Clinically significant periodic limb movements did not occur during sleep. No significant associated arousals.  DIAGNOSIS - Nocturnal Hypoxemia (G47.36)  RECOMMENDATIONS - Repeat in lab PSG with sleep aide given minimal REM sleep and nocturnal hypoxemia. - Avoid alcohol, sedatives and other CNS depressants that may worsen sleep apnea and disrupt normal sleep architecture. - Sleep hygiene should be reviewed to assess factors that may improve sleep quality. - Weight management and regular exercise should be initiated or continued if appropriate.  [Electronically signed] 01/02/2021 07:34 PM  Fransico Him MD, ABSM Diplomate, American Board of Sleep Medicine

## 2021-01-08 ENCOUNTER — Telehealth: Payer: Self-pay | Admitting: *Deleted

## 2021-01-08 DIAGNOSIS — G4733 Obstructive sleep apnea (adult) (pediatric): Secondary | ICD-10-CM

## 2021-01-08 MED ORDER — ESZOPICLONE 2 MG PO TABS: 2.0000 mg | ORAL_TABLET | Freq: Every evening | ORAL | 0 refills | Status: AC | PRN

## 2021-01-08 NOTE — Telephone Encounter (Signed)
-----   Message from Sueanne Margarita, MD sent at 01/02/2021  7:38 PM EST ----- No sleep apnea but did have nocturnal hypoxemia - minimal REM sleep - please repeat PSG with Lunesta 2mg  (#1 tablet with no refills) to take at sleep lab

## 2021-01-10 NOTE — Telephone Encounter (Signed)
The patient has been notified of the result.  Left detailed message on voicemail and informed patient to call back. Marolyn Hammock, [place] 01/10/2021 4:57 PM

## 2021-01-21 ENCOUNTER — Encounter: Payer: Self-pay | Admitting: Neurology

## 2021-01-21 ENCOUNTER — Ambulatory Visit: Payer: Medicaid Other | Admitting: Neurology

## 2023-04-17 ENCOUNTER — Other Ambulatory Visit: Payer: Self-pay | Admitting: Adult Health

## 2023-04-17 DIAGNOSIS — Z1231 Encounter for screening mammogram for malignant neoplasm of breast: Secondary | ICD-10-CM

## 2023-05-10 ENCOUNTER — Ambulatory Visit: Admitting: Obstetrics & Gynecology

## 2023-06-04 ENCOUNTER — Ambulatory Visit: Admitting: Obstetrics & Gynecology

## 2023-06-20 ENCOUNTER — Telehealth: Payer: Self-pay

## 2023-07-07 IMAGING — CT CT HEAD CODE STROKE
2 of 3 series · 12 of 47 positions shown, 15 images · IV contrast (omnipaque)
Comparison: Prior CTA from 01/20/2020.

CLINICAL DATA: Code stroke.  Initial evaluation for acute stroke.

EXAM:
CT ANGIOGRAPHY HEAD AND NECK
TECHNIQUE: Multidetector CT imaging of the head and neck was performed using
the standard protocol during bolus administration of intravenous
contrast. Multiplanar CT image reconstructions and MIPs were
obtained to evaluate the vascular anatomy. Carotid stenosis
measurements (when applicable) are obtained utilizing NASCET
criteria, using the distal internal carotid diameter as the
denominator.
CONTRAST:  75mL OMNIPAQUE IOHEXOL 350 MG/ML SOLN

[Series 2: head w o · axial · 0.47mm/px · z∈[+68,+208]mm · 9 of 34 slices shown, 12 images]
[im 3/34  brain]
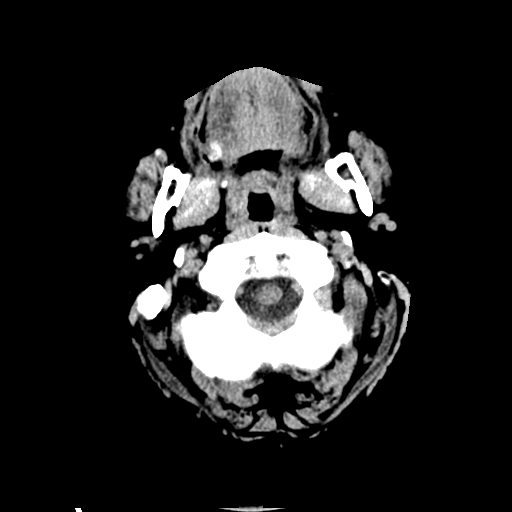
[im 3/34  bone]
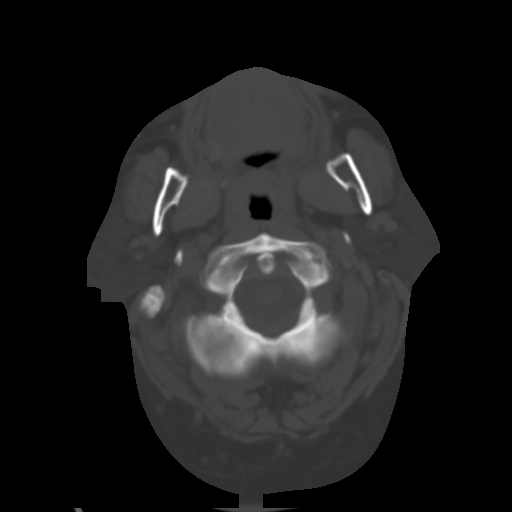
[im 6/34  brain]
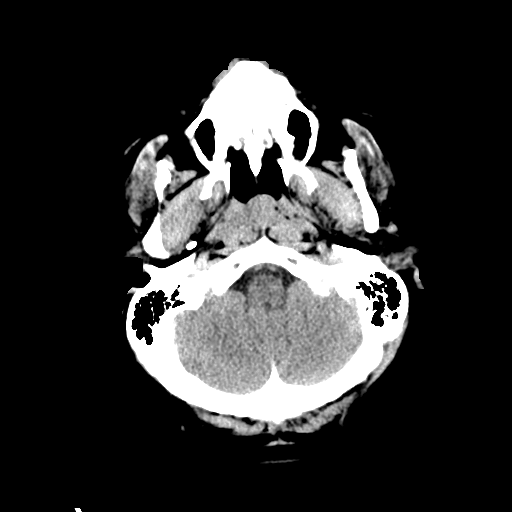
[im 10/34  brain]
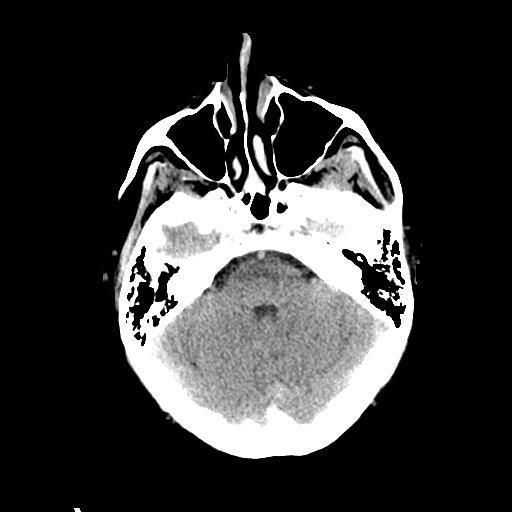
[im 13/34  brain]
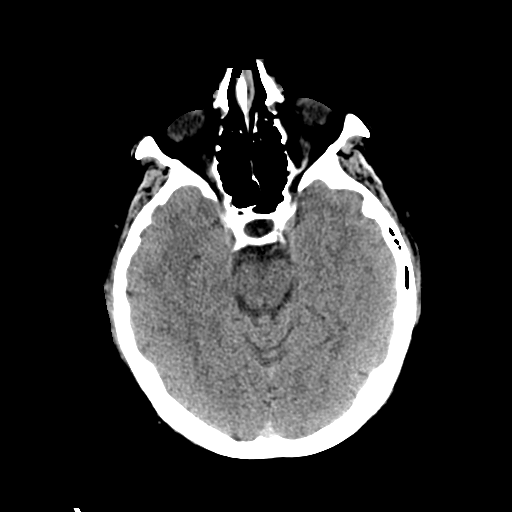
[im 18/34  brain]
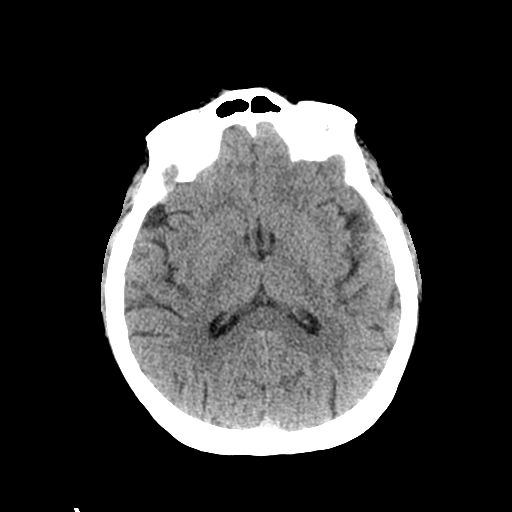
[im 18/34  bone]
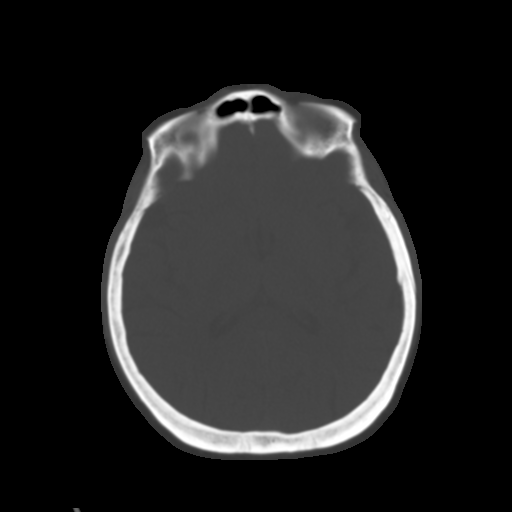
[im 21/34  brain]
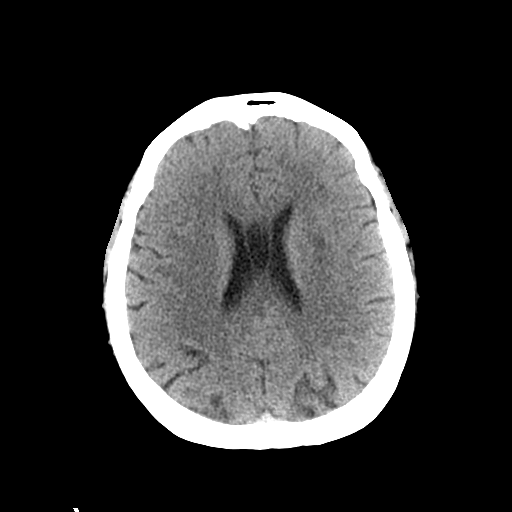
[im 24/34  brain]
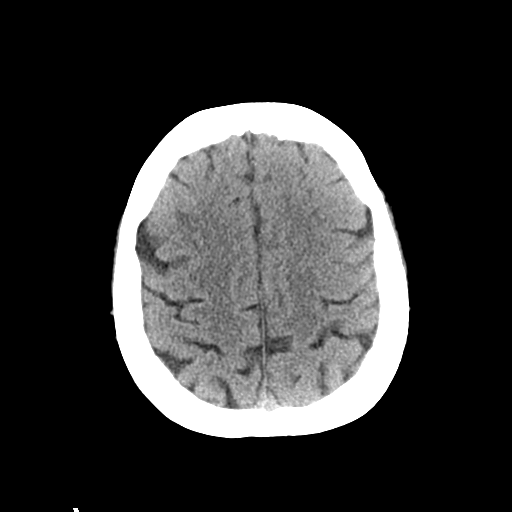
[im 28/34  brain]
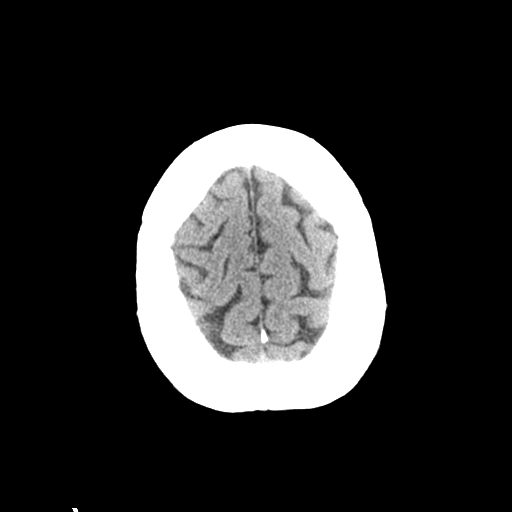
[im 31/34  brain]
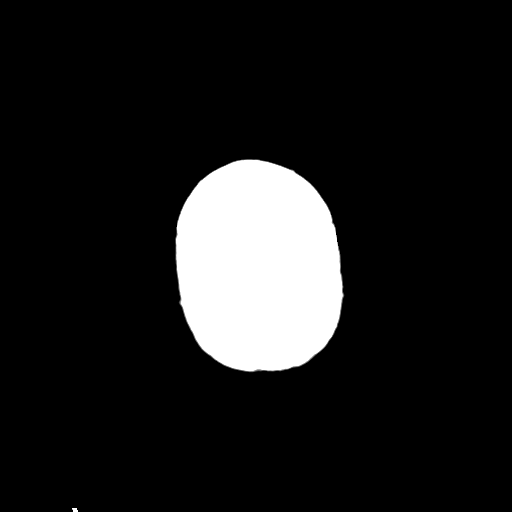
[im 31/34  bone]
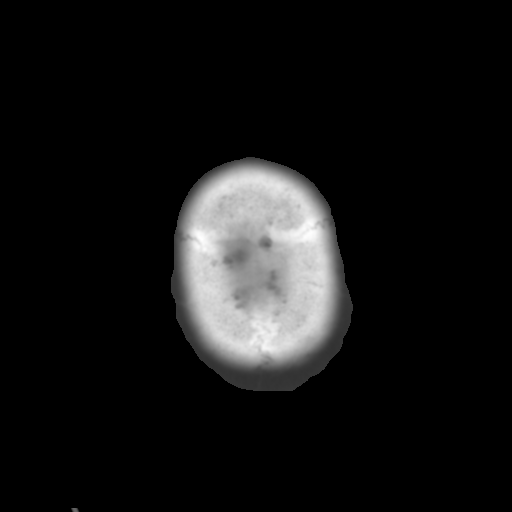

[Series 4: coronal soft · coronal · 0.32mm/px · 3 of 67 slices shown]
[im 23/67  brain]
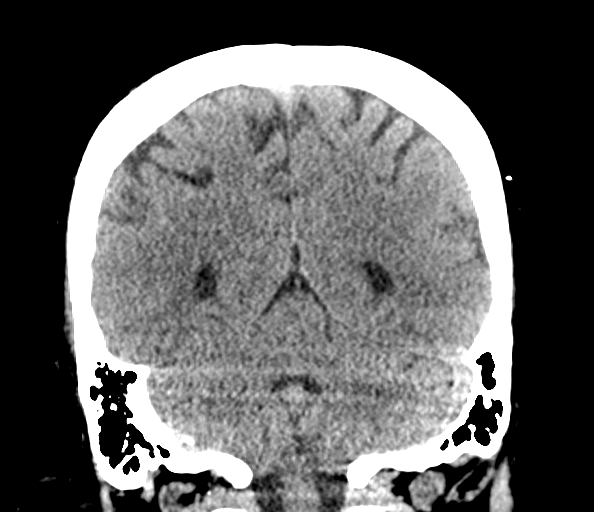
[im 30/67  brain]
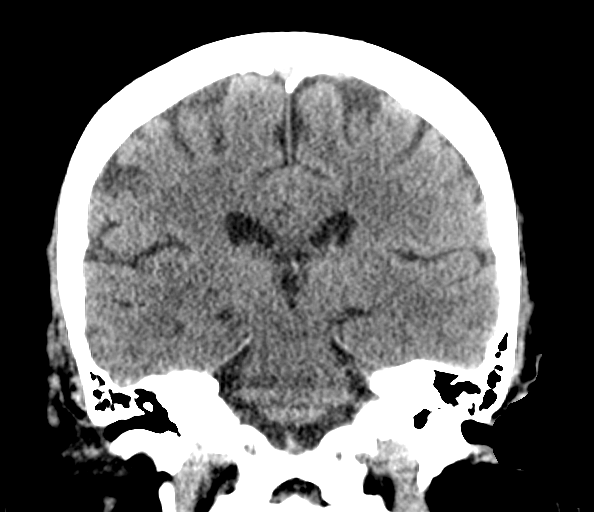
[im 37/67  brain]
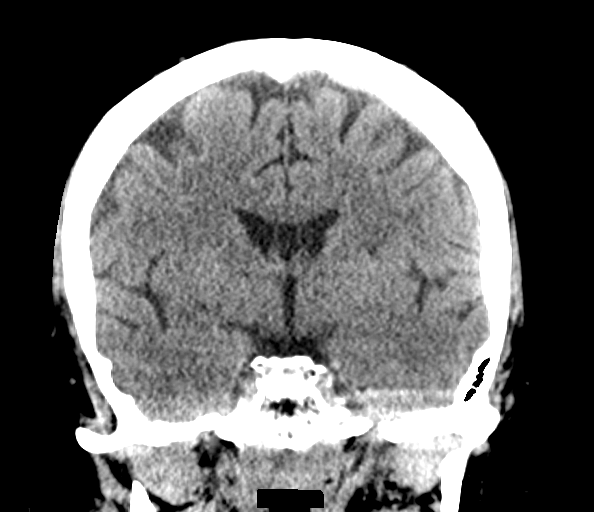

[12 of 47 positions shown; findings below may reference images not displayed]

FINDINGS: CT HEAD FINDINGS

Brain: Cerebral volume within normal limits. No acute intracranial
hemorrhage. No definite acute large vessel territory infarct. No
mass lesion, midline shift or mass effect. No hydrocephalus or
extra-axial fluid collection.

Vascular: No hyperdense vessel.

Skull: Scalp soft tissues and calvarium within normal limits.

Sinuses/Orbits: Globes and orbital soft tissues within normal
limits. Mild scattered mucosal thickening noted throughout the
paranasal sinuses. No mastoid effusion.

Other: None.

ASPECTS (Alberta Stroke Program Early CT Score)

- Ganglionic level infarction (caudate, lentiform nuclei, internal
capsule, insula, M1-M3 cortex): 7

- Supraganglionic infarction (M4-M6 cortex): 3

Total score (0-10 with 10 being normal): 10

CTA NECK FINDINGS

Aortic arch: Visualized aortic arch normal in caliber with normal 3
vessel morphology. Mild atheromatous change about the arch itself.
No hemodynamically significant stenosis seen about the origin of the
great vessels.

Right carotid system: Right CCA widely patent proximally. There is a
probable acutely ruptured plaque or possibly dissection involving
the distal right CCA just prior to the carotid bifurcation. Acute
appearing thrombus seen projecting into the arterial lumen at this
level (series 6, image 196). Associated short-segment stenosis of up
to 75% by NASCET criteria. This is at the site of previously
identified intraluminal thrombus, but appears somewhat worsened on
current exam, raising the possibility for a superimposed acute
component. Additional scattered atheromatous change noted just
distally about the carotid bifurcation/proximal right ICA without
high-grade stenosis. Right ICA otherwise patent distally to the
skull base.

Left carotid system: Left CCA patent from its origin to the
bifurcation without stenosis. Scattered plaque about the left
carotid bulb/proximal left ICA without significant stenosis. Left
ICA patent distally without stenosis, dissection or occlusion.

Vertebral arteries: Both vertebral arteries arise from subclavian
arteries. No proximal subclavian artery stenosis. Both vertebral
arteries patent within the neck without stenosis, dissection or
occlusion.

Skeleton: No visible acute osseous finding. No worrisome osseous
lesions. Patient is edentulous.

Other neck: No other acute soft tissue abnormality within the neck.
No mass or adenopathy.

Upper chest: Scattered hazy ground-glass opacity with interlobular
septal thickening noted within the visualized lungs, nonspecific,
but favored to reflect mild pulmonary interstitial congestion/edema.
Multiple scattered superimposed subcentimeter calcified granulomas
noted throughout the visualized lungs.

Review of the MIP images confirms the above findings

CTA HEAD FINDINGS

Anterior circulation: Petrous segments patent bilaterally. Mild
atheromatous change within the carotid siphons without significant
stenosis. A1 segments patent bilaterally. Normal anterior
communicating artery complex. Anterior cerebral arteries patent to
their distal aspects without stenosis. No M1 stenosis or occlusion.
Normal MCA bifurcations. No visible proximal MCA branch occlusion.
Distal MCA branches perfused and symmetric.

Posterior circulation: Both V4 segments patent to the
vertebrobasilar junction without stenosis. Both PICA origins patent
and normal. Basilar widely patent to its distal aspect without
stenosis. Superior cerebellar arteries patent bilaterally. Both PCAs
primarily supplied via the basilar and are well perfused to there
distal aspects.

Venous sinuses: There is an eccentric somewhat linear filling defect
at the level of the distal transverse sinus, stable from prior
(series 8, image 115). While this could reflect a prominent
arachnoid granulation, possible chronic thrombus and/or fibrinous
change related to prior thrombus could also have this appearance.
Otherwise, major dural sinuses are grossly patent allowing for
timing the contrast bolus.

Anatomic variants: None significant.  No aneurysm.

Review of the MIP images confirms the above findings
IMPRESSION: CT HEAD IMPRESSION:

1. No acute intracranial abnormality.
2. Aspects = 10.

CTA HEAD AND NECK IMPRESSION:

1. Persistent irregular filling defect involving the distal right
CCA as above, consistent with intraluminal thrombus, possibly
related to a ruptured plaque and/or short-segment dissection. Widely
similar finding with seen at this location on prior CTA from
01/20/2020, overall appearance and morphology on today's exam
appears somewhat acute in nature and slightly worsened from prior,
raising the possibility for a superimposed acute component.
Associated stenosis of up to 75% at this level.
2. No visible downstream embolic occlusion or other large vessel
occlusion.
3. Additional scattered atheromatous change about the carotid
bifurcations and carotid siphons without hemodynamically significant
stenosis.
4. Eccentric filling defect involving the distal straight sinus as
above, indeterminate. While this could reflect a prominent arachnoid
granulation, possible chronic thrombus and/or fibrinous change could
also have this appearance. This was also seen on prior CTA from
3531.
5. Scattered hazy ground-glass opacity within the visualized lungs,
favored to reflect a degree of mild pulmonary interstitial
congestion/edema.

Critical Value/emergent results were called by telephone at the time
of interpretation on 10/12/2020 at [DATE] to provider MUNZUR RAMANA ,
who verbally acknowledged these results.

## 2023-07-07 IMAGING — CT CT ANGIO HEAD-NECK (W OR W/O PERF)
2 of 7 series · 8 of 33 positions shown · IV contrast (Omnipaque or Isovue)
Comparison: Prior CTA from 01/20/2020.

CLINICAL DATA: Code stroke.  Initial evaluation for acute stroke.

EXAM:
CT ANGIOGRAPHY HEAD AND NECK
TECHNIQUE: Multidetector CT imaging of the head and neck was performed using
the standard protocol during bolus administration of intravenous
contrast. Multiplanar CT image reconstructions and MIPs were
obtained to evaluate the vascular anatomy. Carotid stenosis
measurements (when applicable) are obtained utilizing NASCET
criteria, using the distal internal carotid diameter as the
denominator.
CONTRAST:  75mL OMNIPAQUE IOHEXOL 350 MG/ML SOLN

[Series 4: cta head & neck · axial · 0.45mm/px · z∈[-2,+114]mm · 2 of 174 slices shown]
[im 58/174  soft-tissue]
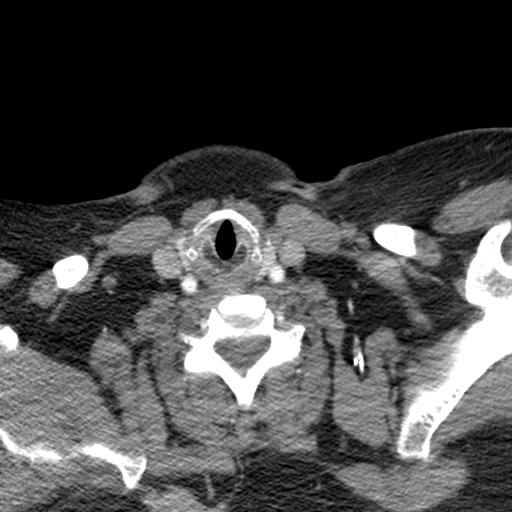
[im 116/174  soft-tissue]
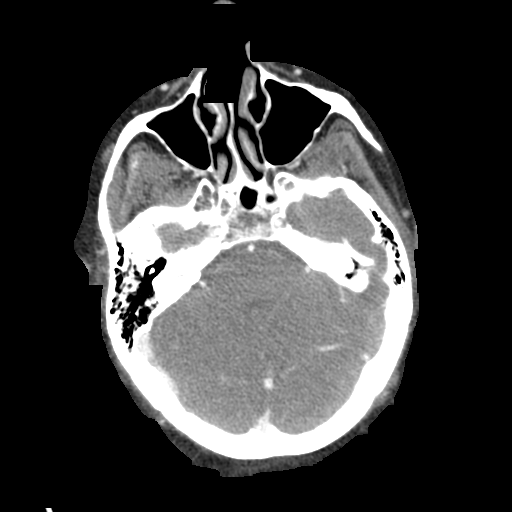

[Series 6: ax thins · axial · 0.39mm/px · z∈[-61,+187]mm · 6 of 348 slices shown]
[im 50/348  soft-tissue]
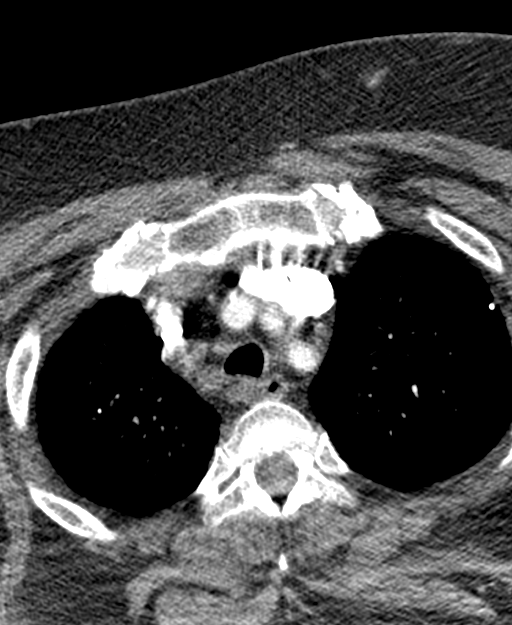
[im 100/348  bone]
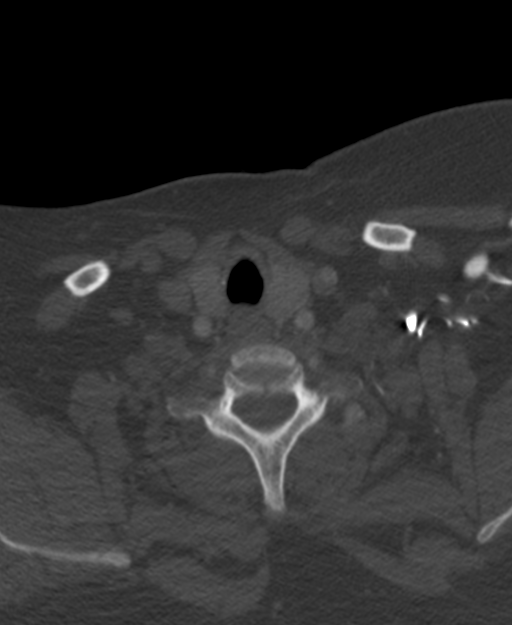
[im 149/348  soft-tissue]
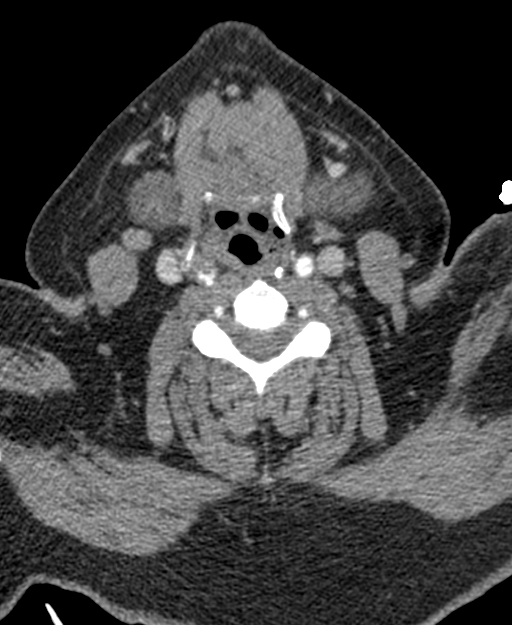
[im 199/348  bone]
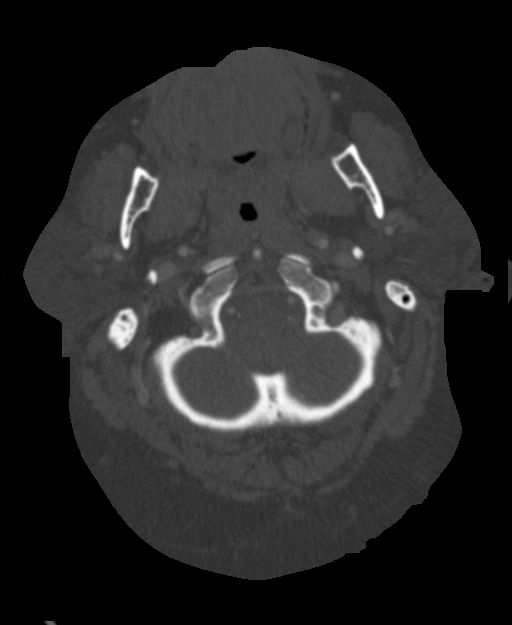
[im 248/348  soft-tissue]
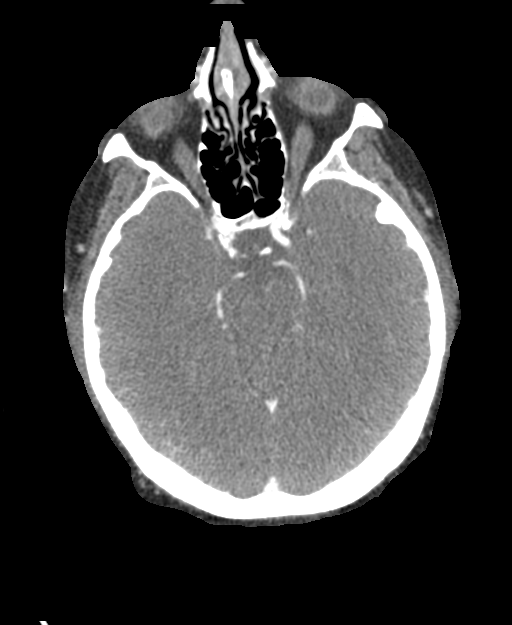
[im 298/348  bone]
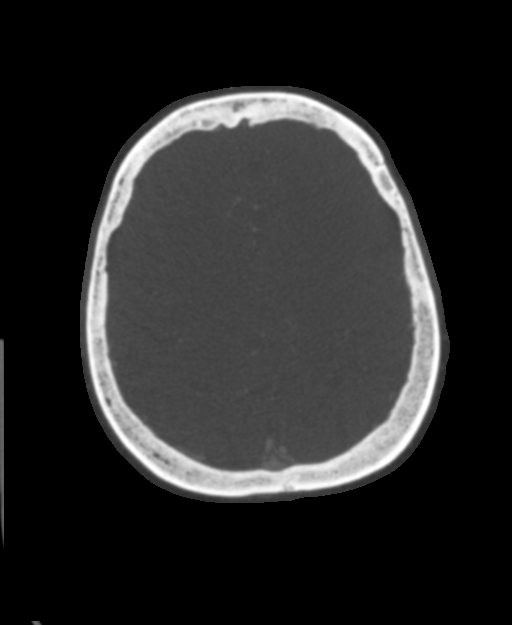

[8 of 33 positions shown; findings below may reference images not displayed]

FINDINGS: CT HEAD FINDINGS

Brain: Cerebral volume within normal limits. No acute intracranial
hemorrhage. No definite acute large vessel territory infarct. No
mass lesion, midline shift or mass effect. No hydrocephalus or
extra-axial fluid collection.

Vascular: No hyperdense vessel.

Skull: Scalp soft tissues and calvarium within normal limits.

Sinuses/Orbits: Globes and orbital soft tissues within normal
limits. Mild scattered mucosal thickening noted throughout the
paranasal sinuses. No mastoid effusion.

Other: None.

ASPECTS (Alberta Stroke Program Early CT Score)

- Ganglionic level infarction (caudate, lentiform nuclei, internal
capsule, insula, M1-M3 cortex): 7

- Supraganglionic infarction (M4-M6 cortex): 3

Total score (0-10 with 10 being normal): 10

CTA NECK FINDINGS

Aortic arch: Visualized aortic arch normal in caliber with normal 3
vessel morphology. Mild atheromatous change about the arch itself.
No hemodynamically significant stenosis seen about the origin of the
great vessels.

Right carotid system: Right CCA widely patent proximally. There is a
probable acutely ruptured plaque or possibly dissection involving
the distal right CCA just prior to the carotid bifurcation. Acute
appearing thrombus seen projecting into the arterial lumen at this
level (series 6, image 196). Associated short-segment stenosis of up
to 75% by NASCET criteria. This is at the site of previously
identified intraluminal thrombus, but appears somewhat worsened on
current exam, raising the possibility for a superimposed acute
component. Additional scattered atheromatous change noted just
distally about the carotid bifurcation/proximal right ICA without
high-grade stenosis. Right ICA otherwise patent distally to the
skull base.

Left carotid system: Left CCA patent from its origin to the
bifurcation without stenosis. Scattered plaque about the left
carotid bulb/proximal left ICA without significant stenosis. Left
ICA patent distally without stenosis, dissection or occlusion.

Vertebral arteries: Both vertebral arteries arise from subclavian
arteries. No proximal subclavian artery stenosis. Both vertebral
arteries patent within the neck without stenosis, dissection or
occlusion.

Skeleton: No visible acute osseous finding. No worrisome osseous
lesions. Patient is edentulous.

Other neck: No other acute soft tissue abnormality within the neck.
No mass or adenopathy.

Upper chest: Scattered hazy ground-glass opacity with interlobular
septal thickening noted within the visualized lungs, nonspecific,
but favored to reflect mild pulmonary interstitial congestion/edema.
Multiple scattered superimposed subcentimeter calcified granulomas
noted throughout the visualized lungs.

Review of the MIP images confirms the above findings

CTA HEAD FINDINGS

Anterior circulation: Petrous segments patent bilaterally. Mild
atheromatous change within the carotid siphons without significant
stenosis. A1 segments patent bilaterally. Normal anterior
communicating artery complex. Anterior cerebral arteries patent to
their distal aspects without stenosis. No M1 stenosis or occlusion.
Normal MCA bifurcations. No visible proximal MCA branch occlusion.
Distal MCA branches perfused and symmetric.

Posterior circulation: Both V4 segments patent to the
vertebrobasilar junction without stenosis. Both PICA origins patent
and normal. Basilar widely patent to its distal aspect without
stenosis. Superior cerebellar arteries patent bilaterally. Both PCAs
primarily supplied via the basilar and are well perfused to there
distal aspects.

Venous sinuses: There is an eccentric somewhat linear filling defect
at the level of the distal transverse sinus, stable from prior
(series 8, image 115). While this could reflect a prominent
arachnoid granulation, possible chronic thrombus and/or fibrinous
change related to prior thrombus could also have this appearance.
Otherwise, major dural sinuses are grossly patent allowing for
timing the contrast bolus.

Anatomic variants: None significant.  No aneurysm.

Review of the MIP images confirms the above findings
IMPRESSION: CT HEAD IMPRESSION:

1. No acute intracranial abnormality.
2. Aspects = 10.

CTA HEAD AND NECK IMPRESSION:

1. Persistent irregular filling defect involving the distal right
CCA as above, consistent with intraluminal thrombus, possibly
related to a ruptured plaque and/or short-segment dissection. Widely
similar finding with seen at this location on prior CTA from
01/20/2020, overall appearance and morphology on today's exam
appears somewhat acute in nature and slightly worsened from prior,
raising the possibility for a superimposed acute component.
Associated stenosis of up to 75% at this level.
2. No visible downstream embolic occlusion or other large vessel
occlusion.
3. Additional scattered atheromatous change about the carotid
bifurcations and carotid siphons without hemodynamically significant
stenosis.
4. Eccentric filling defect involving the distal straight sinus as
above, indeterminate. While this could reflect a prominent arachnoid
granulation, possible chronic thrombus and/or fibrinous change could
also have this appearance. This was also seen on prior CTA from
3531.
5. Scattered hazy ground-glass opacity within the visualized lungs,
favored to reflect a degree of mild pulmonary interstitial
congestion/edema.

Critical Value/emergent results were called by telephone at the time
of interpretation on 10/12/2020 at [DATE] to provider MUNZUR RAMANA ,
who verbally acknowledged these results.

## 2023-12-17 ENCOUNTER — Emergency Department (HOSPITAL_COMMUNITY)
Admission: EM | Admit: 2023-12-17 | Discharge: 2023-12-17 | Disposition: A | Discharge status: Home / Self Care | Attending: Emergency Medicine | Admitting: Emergency Medicine

## 2023-12-17 ENCOUNTER — Emergency Department (HOSPITAL_COMMUNITY)

## 2023-12-17 ENCOUNTER — Encounter (HOSPITAL_COMMUNITY): Payer: Self-pay | Admitting: Emergency Medicine

## 2023-12-17 ENCOUNTER — Other Ambulatory Visit: Payer: Self-pay

## 2023-12-17 DIAGNOSIS — I2510 Atherosclerotic heart disease of native coronary artery without angina pectoris: Secondary | ICD-10-CM | POA: Insufficient documentation

## 2023-12-17 DIAGNOSIS — R072 Precordial pain: Secondary | ICD-10-CM | POA: Diagnosis not present

## 2023-12-17 DIAGNOSIS — M79602 Pain in left arm: Secondary | ICD-10-CM | POA: Diagnosis present

## 2023-12-17 DIAGNOSIS — N2889 Other specified disorders of kidney and ureter: Secondary | ICD-10-CM | POA: Insufficient documentation

## 2023-12-17 DIAGNOSIS — Z7982 Long term (current) use of aspirin: Secondary | ICD-10-CM | POA: Diagnosis not present

## 2023-12-17 DIAGNOSIS — Z8673 Personal history of transient ischemic attack (TIA), and cerebral infarction without residual deficits: Secondary | ICD-10-CM | POA: Diagnosis not present

## 2023-12-17 DIAGNOSIS — Z794 Long term (current) use of insulin: Secondary | ICD-10-CM | POA: Diagnosis not present

## 2023-12-17 DIAGNOSIS — F17210 Nicotine dependence, cigarettes, uncomplicated: Secondary | ICD-10-CM | POA: Insufficient documentation

## 2023-12-17 DIAGNOSIS — E119 Type 2 diabetes mellitus without complications: Secondary | ICD-10-CM | POA: Insufficient documentation

## 2023-12-17 LAB — CBC
HCT: 50.2 % — ABNORMAL HIGH (ref 36.0–46.0)
Hemoglobin: 16.6 g/dL — ABNORMAL HIGH (ref 12.0–15.0)
MCH: 28.8 pg (ref 26.0–34.0)
MCHC: 33.1 g/dL (ref 30.0–36.0)
MCV: 87.2 fL (ref 80.0–100.0)
Platelets: 244 K/uL (ref 150–400)
RBC: 5.76 MIL/uL — ABNORMAL HIGH (ref 3.87–5.11)
RDW: 12.9 % (ref 11.5–15.5)
WBC: 9.8 K/uL (ref 4.0–10.5)
nRBC: 0 % (ref 0.0–0.2)

## 2023-12-17 LAB — CBG MONITORING, ED: Glucose-Capillary: 217 mg/dL — ABNORMAL HIGH (ref 70–99)

## 2023-12-17 LAB — BASIC METABOLIC PANEL WITH GFR
Anion gap: 11 (ref 5–15)
BUN: 11 mg/dL (ref 6–20)
CO2: 27 mmol/L (ref 22–32)
Calcium: 9 mg/dL (ref 8.9–10.3)
Chloride: 99 mmol/L (ref 98–111)
Creatinine, Ser: 0.66 mg/dL (ref 0.44–1.00)
GFR, Estimated: >60 mL/min (ref >60)
Glucose, Bld: 258 mg/dL — ABNORMAL HIGH (ref 70–99)
Potassium: 4.1 mmol/L (ref 3.5–5.1)
Sodium: 137 mmol/L (ref 135–145)

## 2023-12-17 LAB — TROPONIN T, HIGH SENSITIVITY
Troponin T High Sensitivity: 15 ng/L (ref 0–19)
Troponin T High Sensitivity: 16 ng/L (ref 0–19)

## 2023-12-17 MED ORDER — ASPIRIN 81 MG PO CHEW
324.0000 mg | CHEWABLE_TABLET | Freq: Once | ORAL | Status: DC
  Filled 2023-12-17: qty 4

## 2023-12-17 MED ORDER — IOHEXOL 350 MG/ML SOLN
100.0000 mL | Freq: Once | INTRAVENOUS | Status: AC | PRN
  Administered 2023-12-17: 100 mL via INTRAVENOUS

## 2023-12-17 NOTE — Discharge Instructions (Addendum)
 We saw you in the ER for the chest pain.  All of our cardiac workup is normal, including labs, EKG and chest X-RAY are normal.  CT scan did not show any concerning findings either.  We are not sure what is causing your discomfort, but we feel comfortable sending you home at this time. The workup in the ER is not complete, and we have put in referral with cardiology service.  Expect a call from the cardiology service for a follow-up.  Please return to the ER if you have worsening chest pain, shortness of breath, pain radiating to your jaw, shoulder, or back, sweats or fainting.   Incidentally, the CT scan did notice that on the left kidney you have a lesion that needs closer evaluation.  We have given you the phone number for urologist, please call them for close follow-up so that they can assess that area further.

## 2023-12-17 NOTE — ED Triage Notes (Signed)
 Patient BIB [facility] c/o left shoulder pain radiating down her left arm. Patient reports this happened the last time she had a heart attack.  180/74 100% RA CBG 238- no insulin  in 2 months HR 80

## 2023-12-17 NOTE — ED Provider Notes (Addendum)
 [place] EMERGENCY DEPARTMENT AT [facility] Provider Note   CSN: [id number] Arrival date & time: 12/17/23  2016     Patient presents with: Arm Pain   Joanna [name] is a 53 y.o. female.   HPI     53 year old female comes in with chief complaint of chest pain.  Patient states that she was reading, when suddenly she started experiencing left-sided scapular pain radiating to the shoulder and down the left arm.  In the process there was upper part of chest, which was also uncomfortable.  Patient had similar pain with MI.  Patient has past medical history of PCOS, CAD, stroke and diabetes.  Patient admits to smoking 1 pack a day.  Review of system is also positive for chronically lower extremity weakness/tingling in the Right lower extremity.  Prior to Admission medications   Medication Sig Start Date End Date Taking? Authorizing Provider  albuterol  (VENTOLIN  HFA) 108 (90 Base) MCG/ACT inhaler Inhale 1-2 puffs into the lungs every 6 (six) hours as needed for wheezing or shortness of breath. 07/27/09   [provider]  aspirin  EC 81 MG EC tablet Take 1 tablet (81 mg total) by mouth daily. Swallow whole. 10/14/20   Maree, Pratik D, DO  eszopiclone  (LUNESTA ) 2 MG TABS tablet Take 1 tablet (2 mg total) by mouth at bedtime as needed for up to 1 dose for sleep (Take for sleep study). Take immediately before bedtime 01/08/21   Shlomo Wilbert SAUNDERS, MD  fluticasone-salmeterol (ADVAIR HFA) 115-21 MCG/ACT inhaler Inhale 2 puffs into the lungs 2 (two) times daily. 11/26/12   [provider]  gabapentin  (NEURONTIN ) 300 MG capsule Take 300 mg by mouth 3 (three) times daily. Patient not taking: Reported on 11/08/2020 06/16/20   [provider]  gabapentin  (NEURONTIN ) 600 MG tablet Take 600 mg by mouth 3 (three) times daily. 10/27/20   [provider]  icosapent  Ethyl (VASCEPA ) 1 g capsule Take 2 capsules (2 g total) by mouth 2 (two) times daily. 11/08/20   Shlomo Wilbert SAUNDERS, MD  insulin  aspart (NOVOLOG ) 100 UNIT/ML FlexPen Inject 4 Units into the skin 3 (three) times daily with meals. 10/13/20 11/12/20  Maree, Pratik D, DO  insulin  detemir (LEVEMIR ) 100 UNIT/ML FlexPen Inject 25 Units into the skin daily. 10/13/20 11/12/20  Maree, Pratik D, DO  Insulin  Pen Needle (PEN NEEDLES) 32G X 5 MM MISC Enough pen needles for levermir 25 units daily & novolog  4 units TID w/ meals x 30 days. No refills 10/13/20   Maree, Pratik D, DO  lisinopril -hydrochlorothiazide  (ZESTORETIC ) 20-12.5 MG tablet Take 1 tablet by mouth daily. 10/13/20 11/12/20  Maree, Pratik D, DO  rosuvastatin  (CRESTOR ) 20 MG tablet Take 1 tablet (20 mg total) by mouth daily. 11/08/20   Shlomo Wilbert SAUNDERS, MD  traZODone  (DESYREL ) 100 MG tablet Take 100 mg by mouth at bedtime. 10/27/20   [provider]  traZODone  (DESYREL ) 50 MG tablet Take 50-100 mg by mouth at bedtime. Patient not taking: Reported on 11/08/2020 06/16/20   [provider]  venlafaxine  XR (EFFEXOR -XR) 37.5 MG 24 hr capsule Take 1 capsule (37.5 mg total) by mouth daily with breakfast. 10/13/20 11/12/20  Maree Bracken D, DO    Allergies: Wellbutrin [bupropion]    Review of Systems  All other systems reviewed and are negative.   Updated Vital Signs BP (!) 150/74   Pulse 68   Temp 98.1 F (36.7 C) (Oral)   Resp 16   Ht 5' 4 (1.626 m)  Wt 117.5 kg   SpO2 94%   BMI 44.46 kg/m   Physical Exam Vitals and nursing note reviewed.  Constitutional:      Appearance: She is well-developed.  HENT:     Head: Atraumatic.  Eyes:     Extraocular Movements: Extraocular movements intact.  Cardiovascular:     Rate and Rhythm: Normal rate.     Pulses: Normal pulses.  Pulmonary:     Effort: Pulmonary effort is normal.  Musculoskeletal:     Cervical back: Normal range of motion.     Comments: Reproducible tenderness over the left scapular region  Skin:    General: Skin is warm and dry.  Neurological:     Mental Status: She is alert and  oriented to person, place, and time.     (all labs ordered are listed, but only abnormal results are displayed) Labs Reviewed  BASIC METABOLIC PANEL WITH GFR - Abnormal; Notable for the following components:      Result Value   Glucose, Bld 258 (*)    All other components within normal limits  CBC - Abnormal; Notable for the following components:   RBC 5.76 (*)    Hemoglobin 16.6 (*)    HCT 50.2 (*)    All other components within normal limits  CBG MONITORING, ED - Abnormal; Notable for the following components:   Glucose-Capillary 217 (*)    All other components within normal limits  TROPONIN T, HIGH SENSITIVITY  TROPONIN T, HIGH SENSITIVITY    EKG: EKG Interpretation Date/Time:  Monday December 17 2023 20:24:44 EST Ventricular Rate:  71 PR Interval:  163 QRS Duration:  96 QT Interval:  392 QTC Calculation: 426 R Axis:   53  Text Interpretation: Sinus rhythm Low voltage, precordial leads No acute changes No significant change since last tracing Confirmed by Charlyn Sora [phone]) on 12/17/2023 8:40:31 PM  Radiology: CT Angio Chest/Abd/Pel for Dissection W and/or Wo Contrast Result Date: 12/17/2023 EXAM: CTA CHEST, ABDOMEN AND PELVIS WITHOUT AND WITH CONTRAST 12/17/2023 10:13:27 PM TECHNIQUE: CTA of the chest was performed without and with the administration of intravenous contrast. CTA of the abdomen and pelvis was performed with the administration of intravenous contrast. 100 mL iohexol  (OMNIPAQUE ) 350 MG/ML injection was administered. Multiplanar reformatted images are provided for review. MIP images are provided for review. Automated exposure control, iterative reconstruction, and/or weight based adjustment of the mA/kV was utilized to reduce the radiation dose to as low as reasonably achievable. COMPARISON: Comparison with a study from 11/08/2018. Comparison with the same day x-ray. CLINICAL HISTORY: Acute aortic syndrome (AAS) suspected. Left shoulder pain radiating down  the left arm. FINDINGS: VASCULATURE: AORTA: Aortic atherosclerotic calcification. No acute aortic syndrome. No abdominal aortic aneurysm. No dissection. PULMONARY ARTERIES: No pulmonary embolism with the limits of this exam. GREAT VESSELS OF AORTIC ARCH: No acute finding. No dissection. No arterial occlusion or significant stenosis. CELIAC TRUNK: No acute finding. No occlusion or significant stenosis. SUPERIOR MESENTERIC ARTERY: No acute finding. No occlusion or significant stenosis. INFERIOR MESENTERIC ARTERY: No acute finding. No occlusion or significant stenosis. RENAL ARTERIES: Accessory renal arteries bilaterally. No occlusion or significant stenosis. ILIAC ARTERIES: No acute finding. No occlusion or significant stenosis. CHEST: MEDIASTINUM: Coronary artery atherosclerotic calcification. No mediastinal lymphadenopathy. The heart demonstrates no acute abnormality. No pericardial effusion. LUNGS AND PLEURA: Patchy bilateral ground glass opacities. Mild bronchial wall thickening. No focal consolidation or pulmonary edema. No evidence of pleural effusion or pneumothorax. THORACIC BONES AND SOFT TISSUES: No acute bone  or soft tissue abnormality. ABDOMEN AND PELVIS: LIVER: Hepatic steatosis. GALLBLADDER AND BILE DUCTS: Gallbladder is unremarkable. No biliary ductal dilatation. SPLEEN: The spleen is unremarkable. PANCREAS: The pancreas is unremarkable. ADRENAL GLANDS: Lesions in both adrenal glands contain macroscopic fat and are compatible with benign myelolipomas. No follow up recommended. KIDNEYS, URETERS AND BLADDER: Exophytic cystic lesion of the inferior pole of the left kidney with nodule or mural enhancement. This has increased in size from 10 / 2 / 20 and now measures 3.3 x 2.7 cm. No stones in the kidneys or ureters. No hydronephrosis. No perinephric or periureteral stranding. Urinary bladder is unremarkable. GI AND BOWEL: Colonic diverticulosis without diverticulitis. Stomach and duodenal sweep demonstrate  no acute abnormality. There is no bowel obstruction. No abnormal bowel wall thickening or distension. REPRODUCTIVE: IUD in the uterus. PERITONEUM AND RETROPERITONEUM: No ascites or free air. LYMPH NODES: No lymphadenopathy. ABDOMINAL BONES AND SOFT TISSUES: No acute abnormality of the bones. No acute soft tissue abnormality. IMPRESSION: 1. No evidence of acute aortic syndrome or pulmonary embolism. 2. Patchy bilateral ground glass opacities and mild bronchial wall thickening, suggestive of atypical infection. 3. Enlarging complex cystic lesion at the inferior pole of the left kidney, now 3.3 with nodular/mural enhancement This is highly suspicious for renal cell carcinoma and further characterization with nonemergent renal protocol MRI and urology consult are recommended. Electronically signed by: Norman Gatlin MD 12/17/2023 10:26 PM EST RP Workstation: [id number]   DG Chest Portable 1 View Result Date: 12/17/2023 EXAM: 1 VIEW(S) XRAY OF THE CHEST 12/17/2023 08:54:00 PM COMPARISON: 01/20/2020 CLINICAL HISTORY: FINDINGS: LUNGS AND PLEURA: Slightly increased interstitial markings, favored chronic interstitial lung disease over acute infection or edema. No pleural effusion. No pneumothorax. HEART AND MEDIASTINUM: No acute abnormality of the cardiac and mediastinal silhouettes. BONES AND SOFT TISSUES: No acute osseous abnormality. IMPRESSION: 1. No acute findings. Electronically signed by: Pinkie Pebbles MD 12/17/2023 08:59 PM EST RP Workstation: [id number]     Procedures   Medications Ordered in the ED  aspirin  chewable tablet 324 mg (324 mg Oral Not Given 12/17/23 2059)  iohexol  (OMNIPAQUE ) 350 MG/ML injection 100 mL (100 mLs Intravenous Contrast Given 12/17/23 2200)                                    Medical Decision Making Amount and/or Complexity of Data Reviewed Labs: ordered. Radiology: ordered.  Risk OTC drugs. Prescription drug management.   This patient presents to the ED with  chief complaint(s) of left-sided shoulder pain with pertinent past medical history of CAD, stroke, diabetes, hypertension.The complaint involves an extensive differential diagnosis and also carries with it a high risk of complications and morbidity.    The differential diagnosis considered for this patient includes  ACS syndrome Aortic dissection CHF exacerbation Valvular disorder Myocarditis Pericarditis Endocarditis Pericardial effusion / tamponade Pneumonia Pleural effusion / Pulmonary edema PE Pneumothorax Musculoskeletal pain PUD / Gastritis / Esophagitis Esophageal spasm  The initial plan is to get delta troponin, CT angio dissection. Pain is reproducible. But there is radiation down the arm, which is odd and also she suggests some tingling type sensation in the right leg.  Additional history obtained: Records reviewed previous admission documents and previous imaging -multiple CT angio with thrombosis noted.  No dissection study.  Independent labs interpretation:  The following labs were independently interpreted: Initial troponin is 15.  No anemia.  Hemoglobin/Heema crit elevated.  Patient's creatinine is normal.  Independent visualization and interpretation of imaging: - I independently visualized the following imaging with scope of interpretation limited to determining acute life threatening conditions related to emergency care: CT dissection, which revealed no evidence of dissection.  Per radiologist however, patient does have left kidney mural enhancement.  Given his smoking history, concerns for tumor.  Treatment and Reassessment: Patient reassessed.  Pain remains reproducible.  There is no pain radiating down the left upper extremity, pain is only in the scapular region.  Discussed with the patient that initial troponin, EKG is reassuring.  Dissection study is normal, but there is incidental finding of the lesion in the kidney that will need urology  evaluation.  Patient made aware that if the second troponin is fine, will discharge her with urology follow-up and cardiology referral. Return precautions to the ER also discussed.  Admission was considered, however in the setting of reproducible pain, delta troponin being negative and EKG looking reassuring, I feel comfortable with outpatient management.  Final diagnoses:  Renal mass  Precordial chest pain  Left arm pain    ED Discharge Orders          Ordered    Ambulatory referral to Cardiology       Comments: If you have not heard from the Cardiology office within the next 72 hours please call [phone].   12/17/23 [id number]               Charlyn Sora, MD 12/17/23 [id number]    Charlyn Sora, MD 12/17/23 2324
